# Patient Record
Sex: Female | Born: 1950
Health system: Southern US, Community
[De-identification: ages and names within clinical notes are randomized; demographics above are authoritative.]

## PROBLEM LIST (undated history)

## (undated) DIAGNOSIS — R112 Nausea with vomiting, unspecified: Secondary | ICD-10-CM

## (undated) DIAGNOSIS — G4730 Sleep apnea, unspecified: Secondary | ICD-10-CM

## (undated) DIAGNOSIS — N189 Chronic kidney disease, unspecified: Secondary | ICD-10-CM

## (undated) DIAGNOSIS — Z9989 Dependence on other enabling machines and devices: Secondary | ICD-10-CM

## (undated) DIAGNOSIS — G4733 Obstructive sleep apnea (adult) (pediatric): Secondary | ICD-10-CM

## (undated) DIAGNOSIS — E119 Type 2 diabetes mellitus without complications: Secondary | ICD-10-CM

## (undated) DIAGNOSIS — C801 Malignant (primary) neoplasm, unspecified: Secondary | ICD-10-CM

## (undated) DIAGNOSIS — K219 Gastro-esophageal reflux disease without esophagitis: Secondary | ICD-10-CM

## (undated) DIAGNOSIS — R21 Rash and other nonspecific skin eruption: Secondary | ICD-10-CM

## (undated) DIAGNOSIS — I10 Essential (primary) hypertension: Secondary | ICD-10-CM

## (undated) DIAGNOSIS — Z9889 Other specified postprocedural states: Secondary | ICD-10-CM

## (undated) DIAGNOSIS — J189 Pneumonia, unspecified organism: Secondary | ICD-10-CM

## (undated) DIAGNOSIS — Z803 Family history of malignant neoplasm of breast: Secondary | ICD-10-CM

## (undated) DIAGNOSIS — Z8719 Personal history of other diseases of the digestive system: Secondary | ICD-10-CM

## (undated) DIAGNOSIS — E785 Hyperlipidemia, unspecified: Secondary | ICD-10-CM

## (undated) DIAGNOSIS — I480 Paroxysmal atrial fibrillation: Secondary | ICD-10-CM

## (undated) DIAGNOSIS — Z808 Family history of malignant neoplasm of other organs or systems: Secondary | ICD-10-CM

## (undated) DIAGNOSIS — K760 Fatty (change of) liver, not elsewhere classified: Secondary | ICD-10-CM

## (undated) DIAGNOSIS — M722 Plantar fascial fibromatosis: Secondary | ICD-10-CM

## (undated) DIAGNOSIS — R7303 Prediabetes: Secondary | ICD-10-CM

## (undated) DIAGNOSIS — N393 Stress incontinence (female) (male): Secondary | ICD-10-CM

## (undated) DIAGNOSIS — M1990 Unspecified osteoarthritis, unspecified site: Secondary | ICD-10-CM

## (undated) DIAGNOSIS — S32010A Wedge compression fracture of first lumbar vertebra, initial encounter for closed fracture: Secondary | ICD-10-CM

## (undated) DIAGNOSIS — Z800 Family history of malignant neoplasm of digestive organs: Secondary | ICD-10-CM

## (undated) DIAGNOSIS — R269 Unspecified abnormalities of gait and mobility: Secondary | ICD-10-CM

## (undated) DIAGNOSIS — I483 Typical atrial flutter: Secondary | ICD-10-CM

## (undated) HISTORY — PX: BLADDER SURGERY: SHX569

## (undated) HISTORY — PX: COLONOSCOPY: SHX174

## (undated) HISTORY — PX: BURCH PROCEDURE: SHX1273

## (undated) HISTORY — DX: Family history of malignant neoplasm of other organs or systems: Z80.8

## (undated) HISTORY — DX: Family history of malignant neoplasm of digestive organs: Z80.0

## (undated) HISTORY — DX: Sleep apnea, unspecified: G47.30

## (undated) HISTORY — DX: Stress incontinence (female) (male): N39.3

## (undated) HISTORY — DX: Hyperlipidemia, unspecified: E78.5

## (undated) HISTORY — DX: Paroxysmal atrial fibrillation: I48.0

## (undated) HISTORY — DX: Malignant (primary) neoplasm, unspecified: C80.1

## (undated) HISTORY — DX: Gastro-esophageal reflux disease without esophagitis: K21.9

## (undated) HISTORY — DX: Family history of malignant neoplasm of breast: Z80.3

## (undated) HISTORY — PX: ANTERIOR AND POSTERIOR VAGINAL REPAIR: SUR5

## (undated) HISTORY — DX: Unspecified abnormalities of gait and mobility: R26.9

## (undated) HISTORY — DX: Typical atrial flutter: I48.3

## (undated) HISTORY — PX: VAGINAL HYSTERECTOMY: SUR661

## (undated) HISTORY — DX: Chronic kidney disease, unspecified: N18.9

## (undated) HISTORY — DX: Essential (primary) hypertension: I10

---

## 2000-07-23 ENCOUNTER — Encounter: Payer: Self-pay | Admitting: Family Medicine

## 2000-07-23 ENCOUNTER — Encounter: Admission: RE | Admit: 2000-07-23 | Discharge: 2000-07-23 | Payer: Self-pay | Admitting: Family Medicine

## 2001-04-07 ENCOUNTER — Ambulatory Visit (HOSPITAL_COMMUNITY): Admission: RE | Admit: 2001-04-07 | Discharge: 2001-04-07 | Payer: Self-pay | Admitting: Gastroenterology

## 2001-09-01 ENCOUNTER — Inpatient Hospital Stay (HOSPITAL_COMMUNITY): Admission: RE | Admit: 2001-09-01 | Discharge: 2001-09-03 | Payer: Self-pay | Admitting: Obstetrics and Gynecology

## 2001-09-01 ENCOUNTER — Encounter (INDEPENDENT_AMBULATORY_CARE_PROVIDER_SITE_OTHER): Payer: Self-pay | Admitting: Specialist

## 2004-02-16 ENCOUNTER — Encounter: Admission: RE | Admit: 2004-02-16 | Discharge: 2004-02-16 | Payer: Self-pay | Admitting: Family Medicine

## 2004-02-16 IMAGING — CR DG UGI W/ HIGH DENSITY W/KUB
1 series · 1 of 1 positions shown · non-contrast
Comparison: none

CLINICAL DATA: The patient has abdominal pain as well as a cough.
 HIGH DENSITY UPPER GI SERIES WITH KUB
 The preliminary KUB reveals the bowel gas pattern to be unremarkable.
 The swallowing function is normal.  There is cervical spondylosis with mild extrinsic on the cervical esophagus.  The primary peristaltic wave is satisfactory.  There is noted to be a sliding type hiatal hernia with a small Schatzki?s ring which does not obstruct the passage of a 13 mm tablet.  There is noted to be mild associated gastroesophageal reflux.
 The stomach is well filled without abnormality.  There is some delay in filling of the duodenum.  There is diffuse spasm of the duodenum with prominence of the duodenal mucosa.  Small superficial ulceration may be present.
 IMPRESSION
 1.  Hiatal hernia with mild gastroesophageal reflux.
 2.  Findings thought to represent peptic ulcer changes of the duodenum with questionable active ulceration.

[view not recorded]
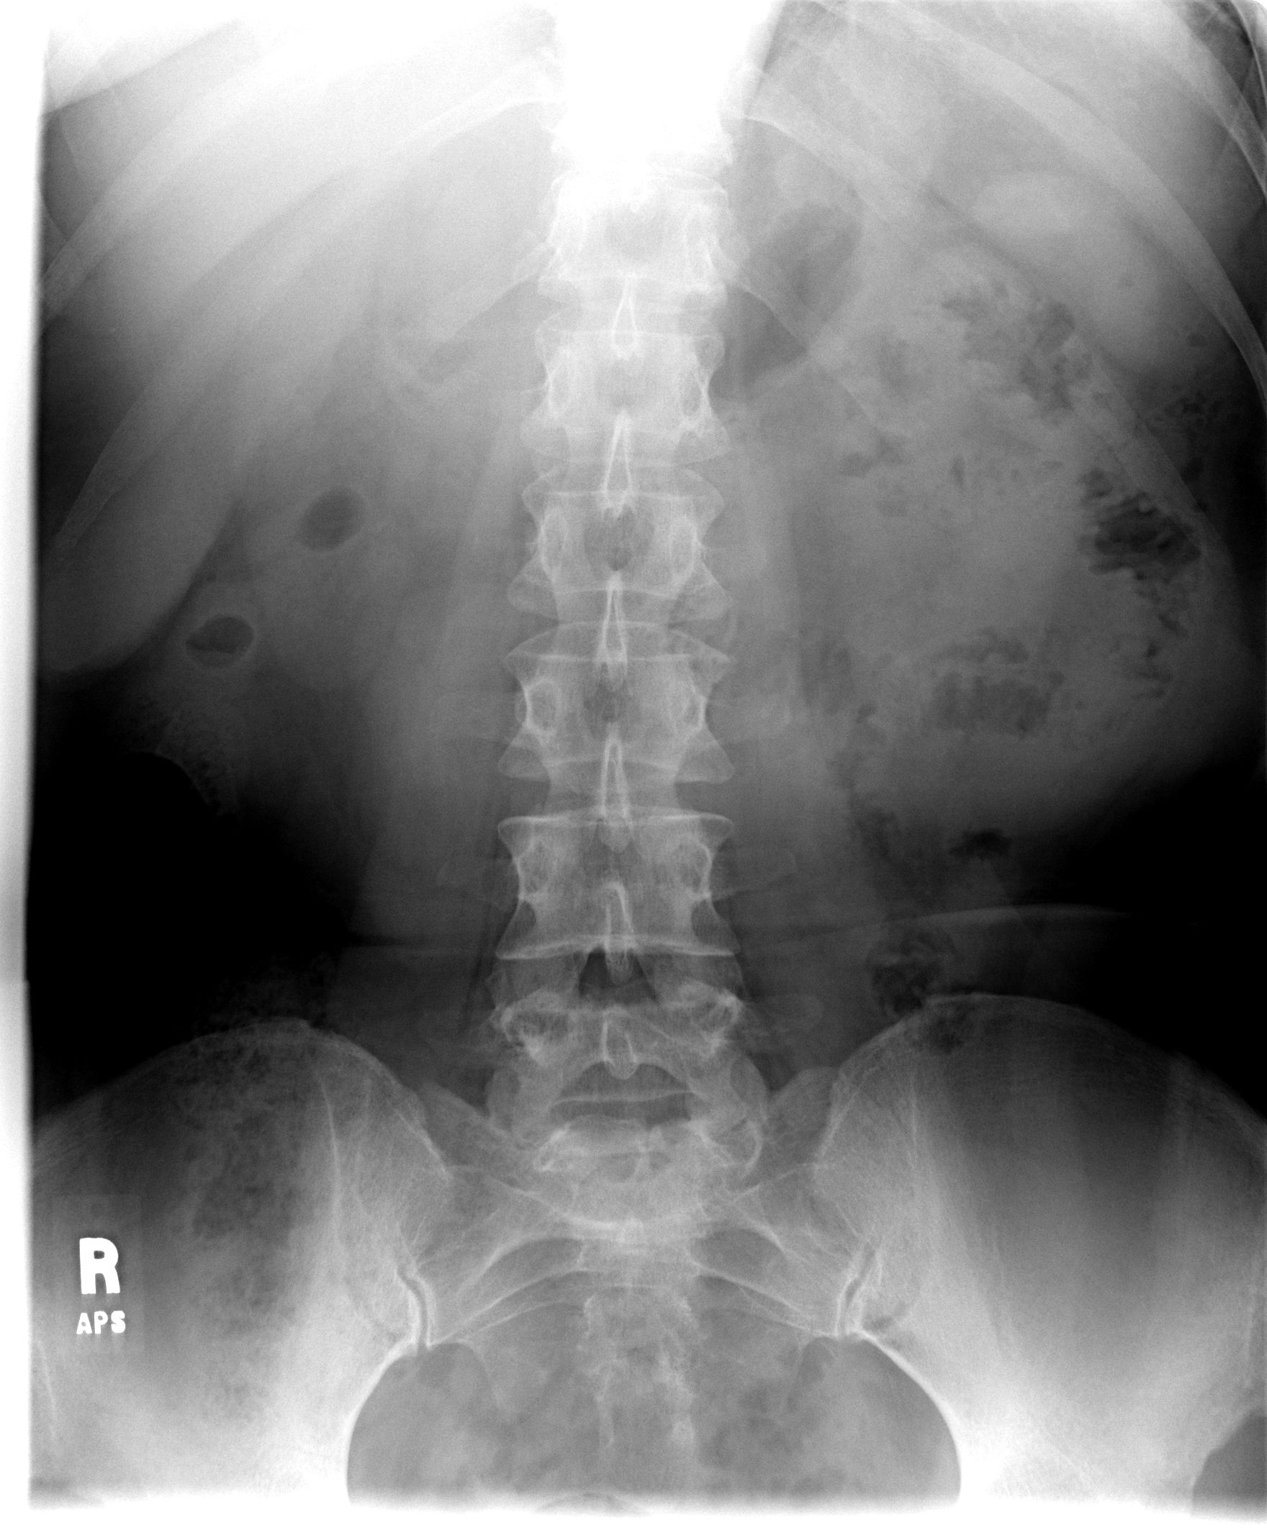

[1 of 1 positions shown; findings below may reference images not displayed]

## 2004-02-16 IMAGING — US US ABDOMEN COMPLETE
1 series · 14 of 25 positions shown · non-contrast
Comparison: none

CLINICAL DATA: Abdominal pain.  
 ULTRASOUND OF THE ABDOMEN, COMPLETE
 Gallbladder is well visualized without evidence of stones.  There is a small polyp in the gallbladder.  Gallbladder wall is slightly indistinct, however, it is not felt to be enlarged.  No associated fluid.  Common bile duct is normal measuring 4 mm.  There is diffuse increase in the echoes throughout the liver with some barium near the porta hepatis.  The pancreas is unremarkable although the tail of the pancreas is not optimally visualized.  Spleen size is normal.  Right kidney measures 12.5 cm with some thinning of the cortex of the right kidney.  No hydronephrosis.  The left kidney measures 12.6 cm with a 15 x 10 mm echogenic area in the upper pole of the left kidney, probably thought to represent an angiomyolipoma.  Aorta is normal measuring 2.4 cm.  The IVC is unremarkable.
 IMPRESSION
 No gallstones.  Small gallbladder polyp. 
 Fatty changes throughout the liver. 
 15 mm probable angiomyolipoma of the upper pole of the left kidney.  Follow up ultrasound, particularly of the left kidney, would be suggested in six months.

[Series 1: unknown · 0.30mm/px · 14 of 75 slices shown]
[im 1/75]
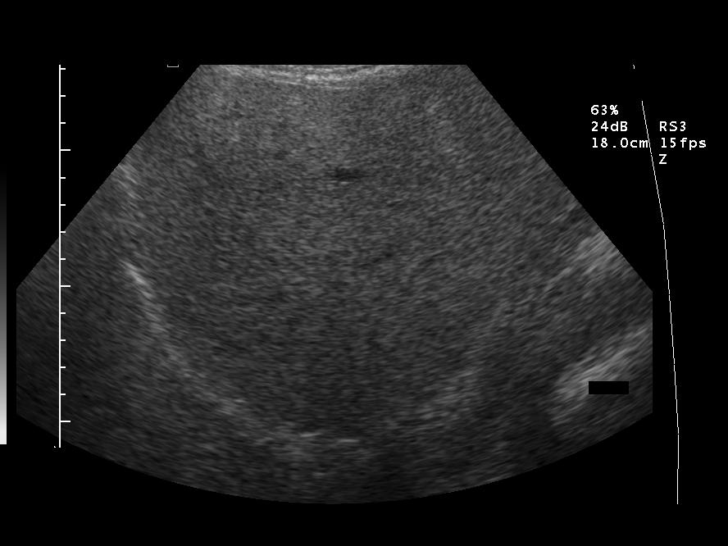
[im 7/75]
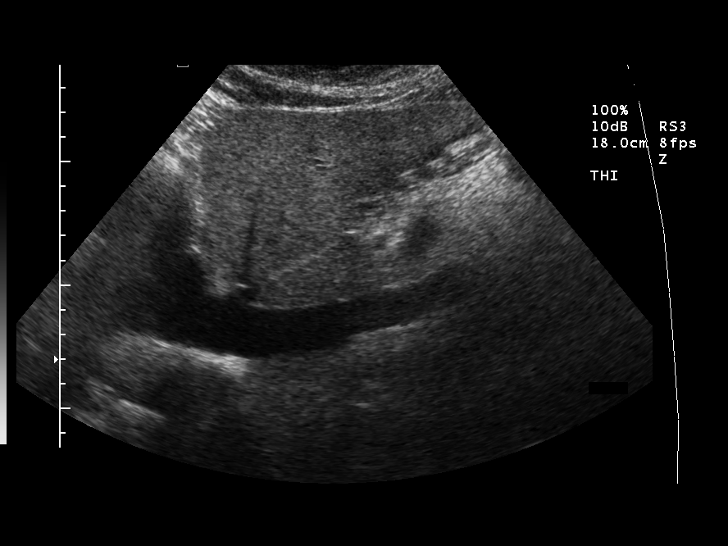
[im 13/75]
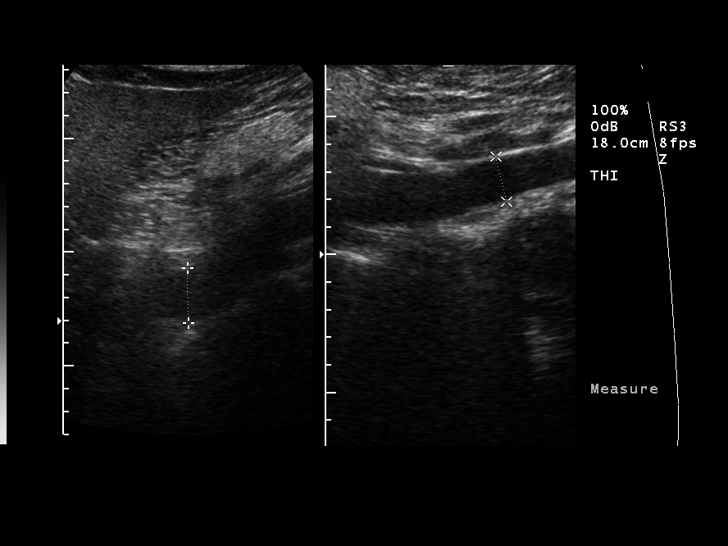
[im 19/75]
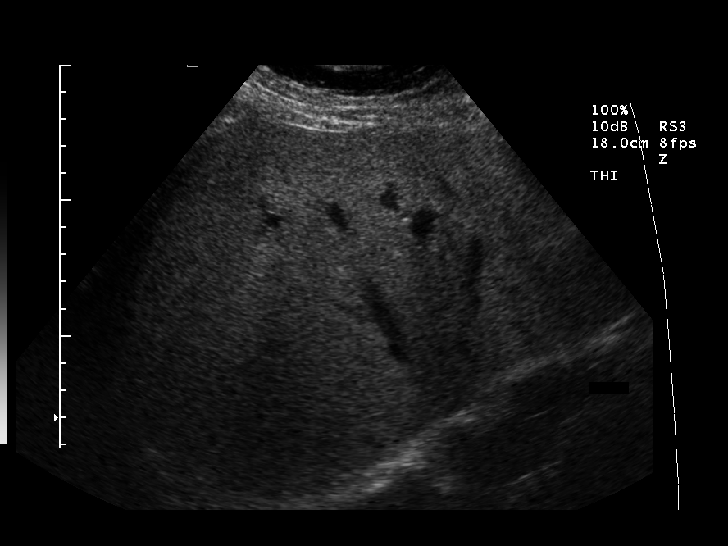
[im 25/75]
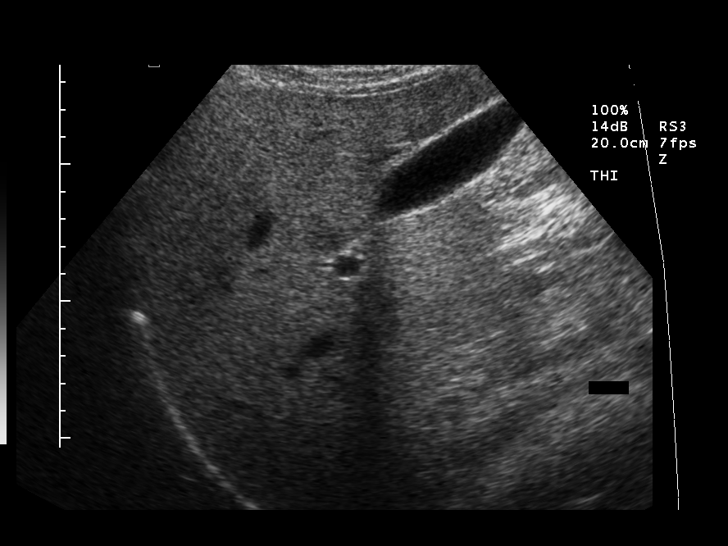
[im 28/75]
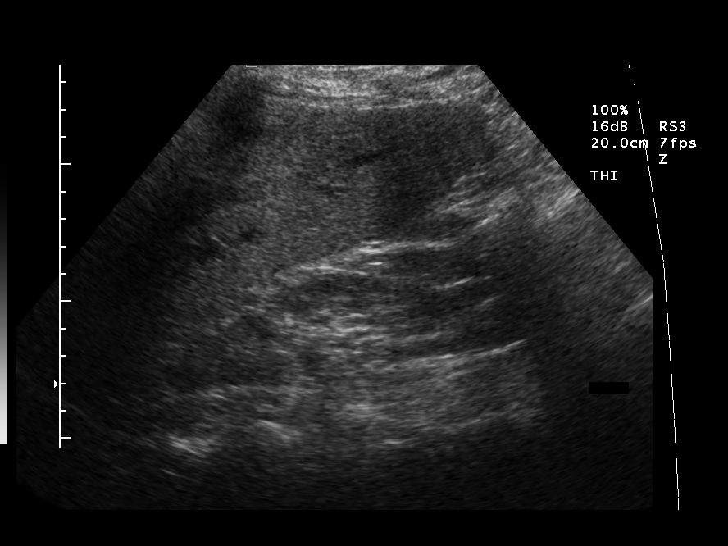
[im 34/75]
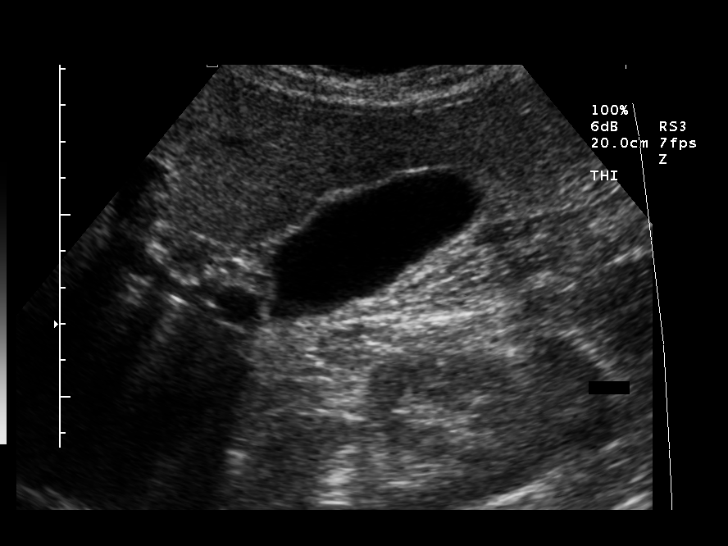
[im 41/75]
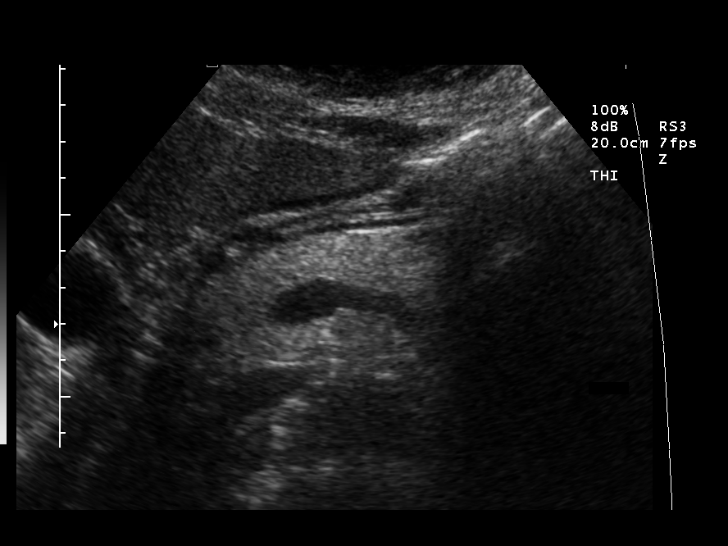
[im 47/75]
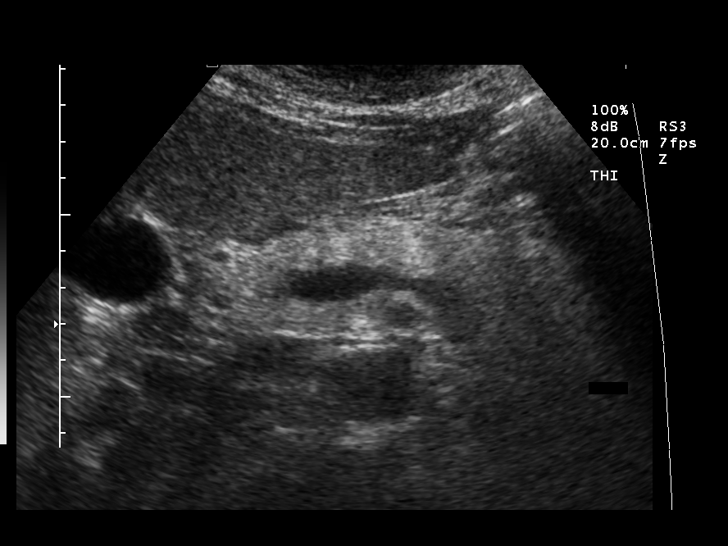
[im 50/75]
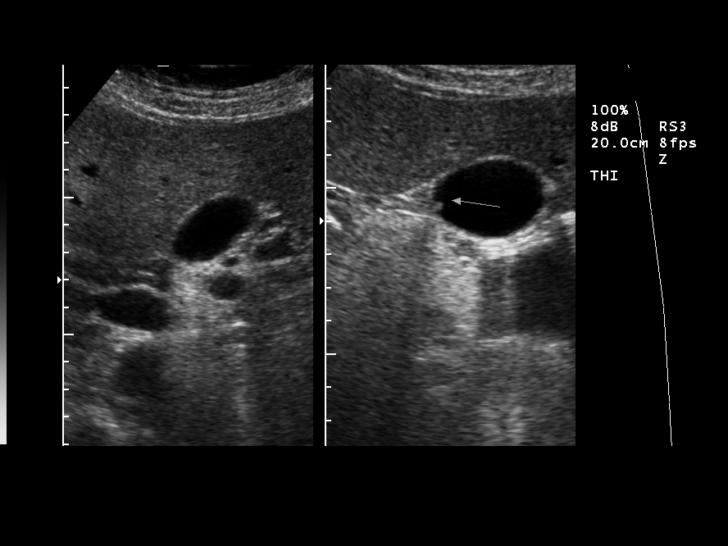
[im 56/75]
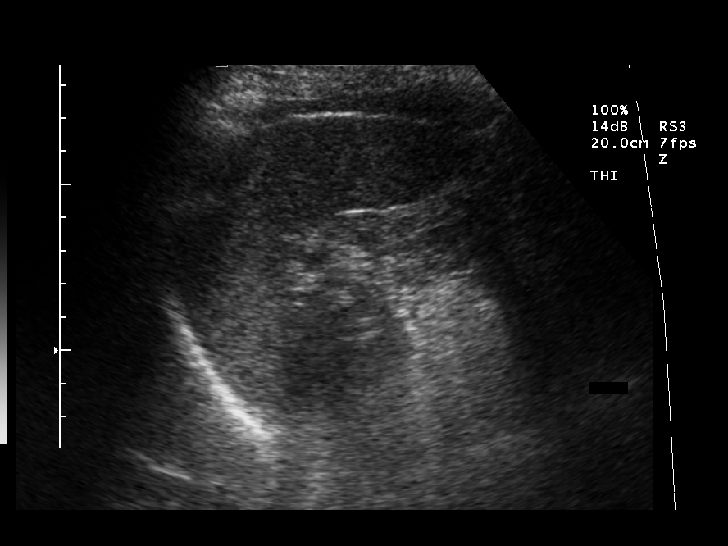
[im 62/75]
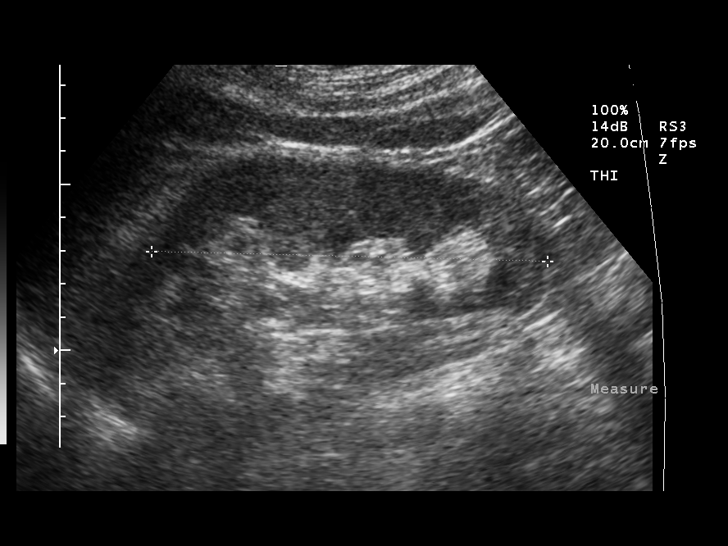
[im 68/75]
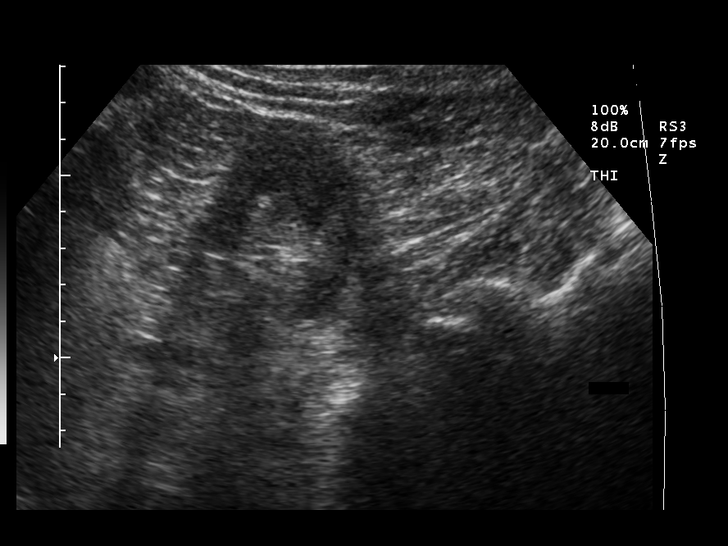
[im 75/75]
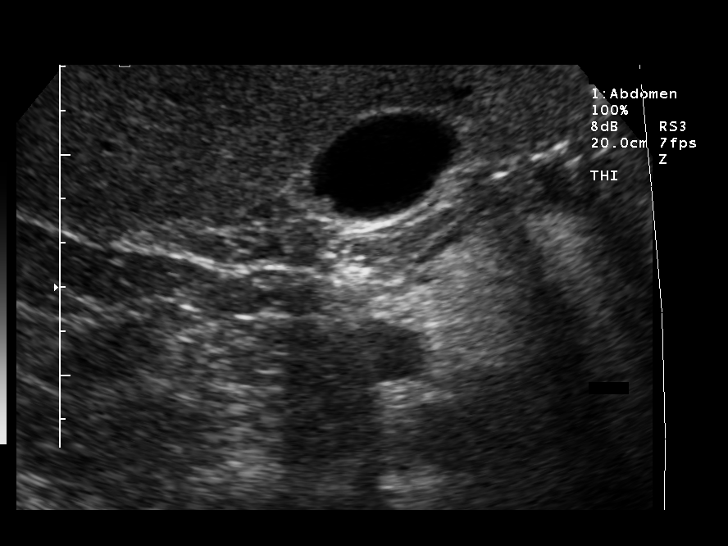

[14 of 25 positions shown; findings below may reference images not displayed]

## 2004-02-16 IMAGING — RF DG UGI W/ HIGH DENSITY W/KUB
12 of 14 series · 19 of 24 positions shown · non-contrast
Comparison: none

CLINICAL DATA: The patient has abdominal pain as well as a cough.
 HIGH DENSITY UPPER GI SERIES WITH KUB
 The preliminary KUB reveals the bowel gas pattern to be unremarkable.
 The swallowing function is normal.  There is cervical spondylosis with mild extrinsic on the cervical esophagus.  The primary peristaltic wave is satisfactory.  There is noted to be a sliding type hiatal hernia with a small Schatzki?s ring which does not obstruct the passage of a 13 mm tablet.  There is noted to be mild associated gastroesophageal reflux.
 The stomach is well filled without abnormality.  There is some delay in filling of the duodenum.  There is diffuse spasm of the duodenum with prominence of the duodenal mucosa.  Small superficial ulceration may be present.
 IMPRESSION
 1.  Hiatal hernia with mild gastroesophageal reflux.
 2.  Findings thought to represent peptic ulcer changes of the duodenum with questionable active ulceration.

[Series 1: run · 1 of 1 slices shown (1 of 12)]
[im 1/1]
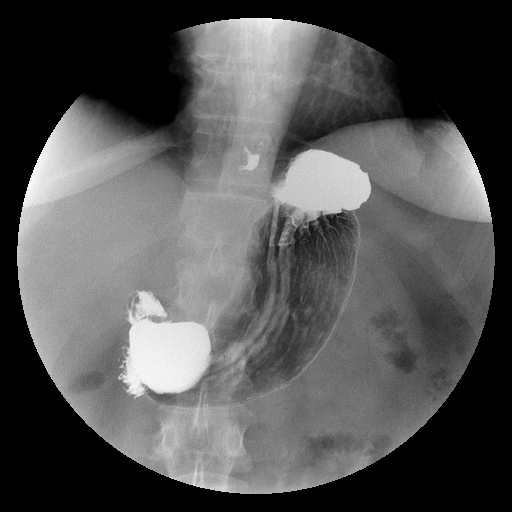

[Series 2: run · 1 of 1 slices shown (2 of 12)]
[im 1/1]
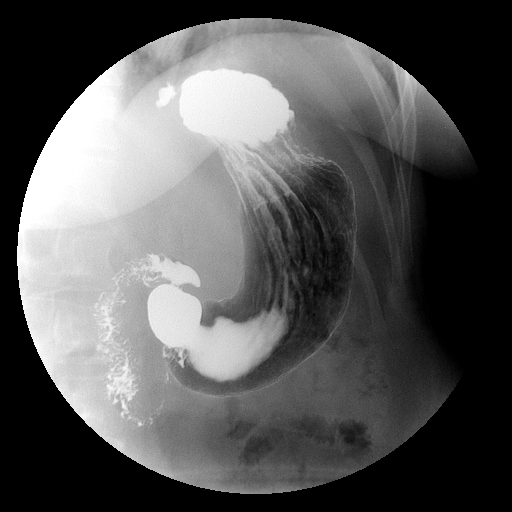

[Series 4: run · 1 of 1 slices shown (3 of 12)]
[im 1/1]
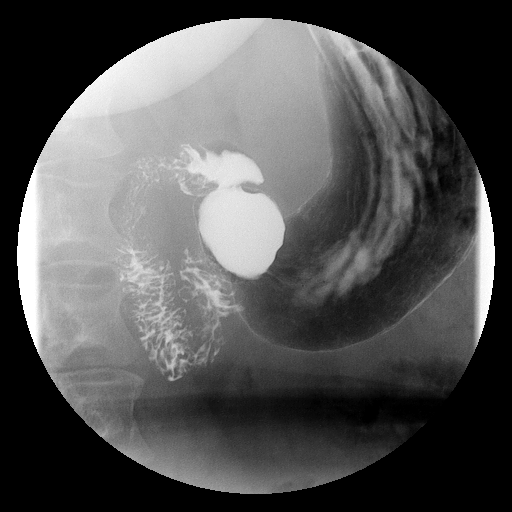

[Series 5: run · 1 of 1 slices shown (4 of 12)]
[im 1/1]
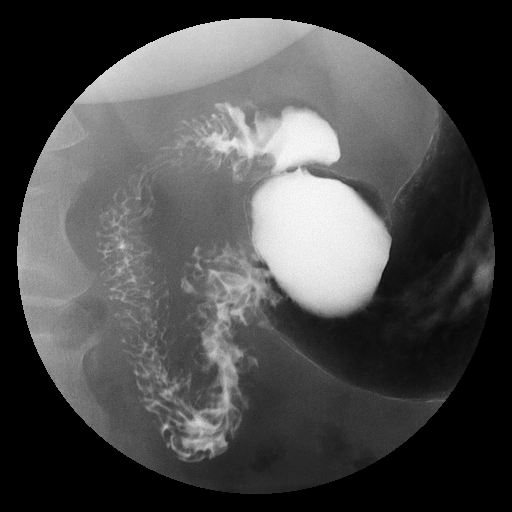

[Series 6: run · 1 of 1 slices shown (5 of 12)]
[im 1/1]
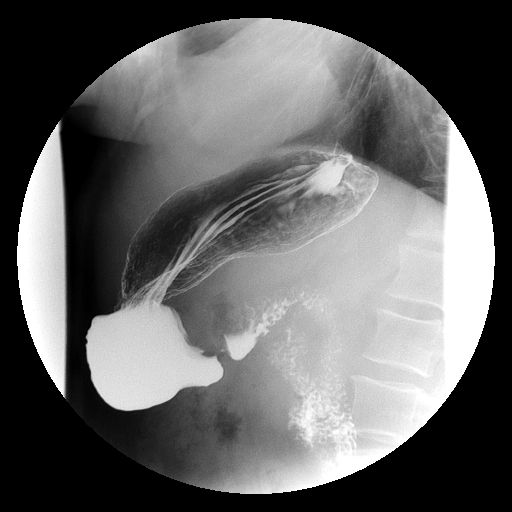

[Series 7: run · 1 of 1 slices shown (6 of 12)]
[im 1/1]
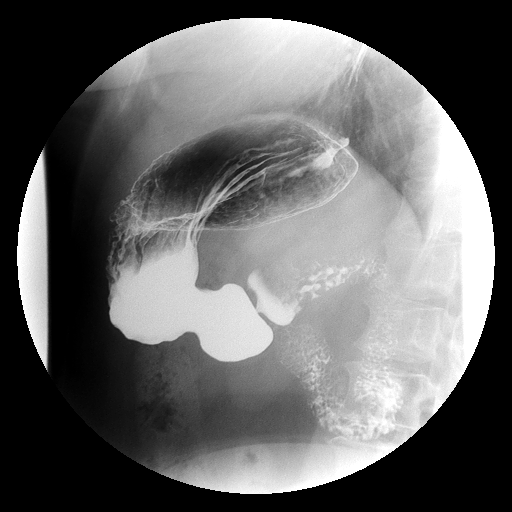

[Series 9: run · 3 of 19 slices shown (7 of 12)]
[im 1/19]
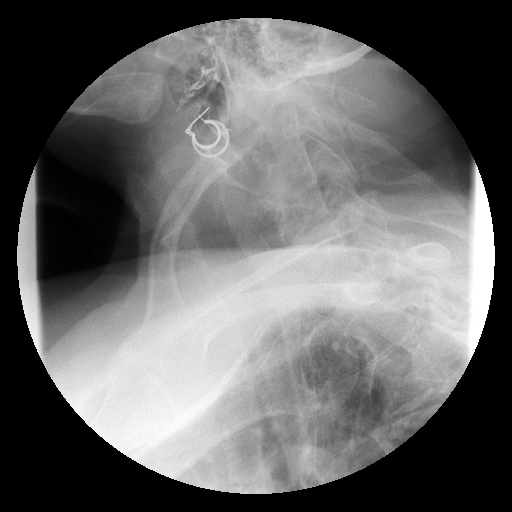
[im 10/19]
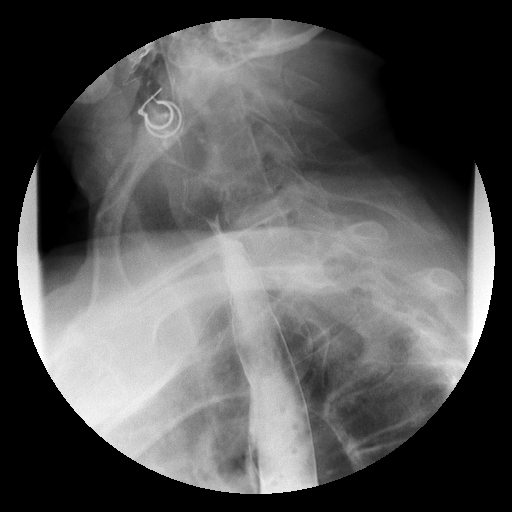
[im 19/19]
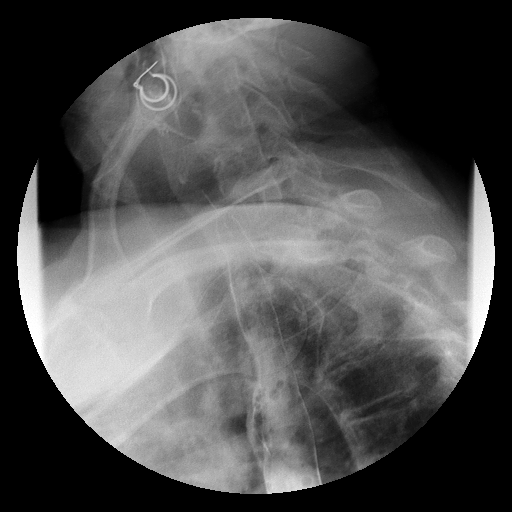

[Series 10: run · 4 of 24 slices shown (8 of 12)]
[im 5/24]
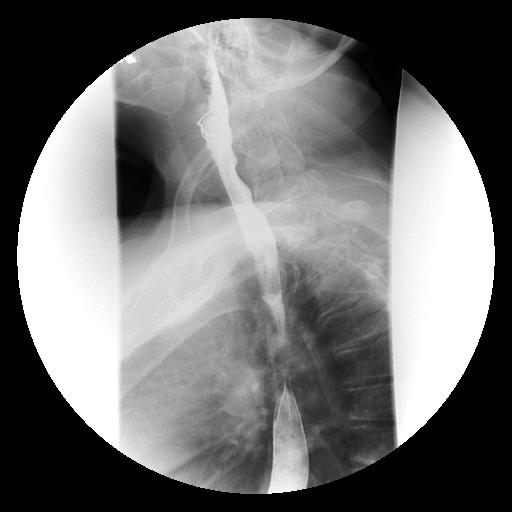
[im 10/24]
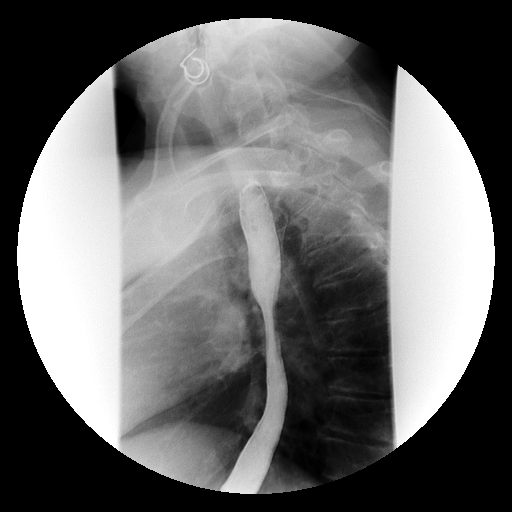
[im 14/24]
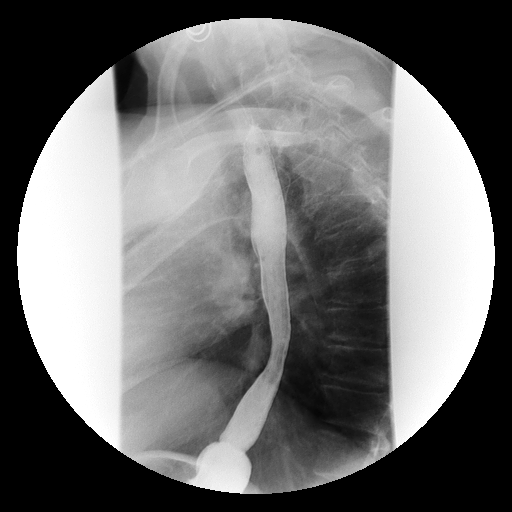
[im 19/24]
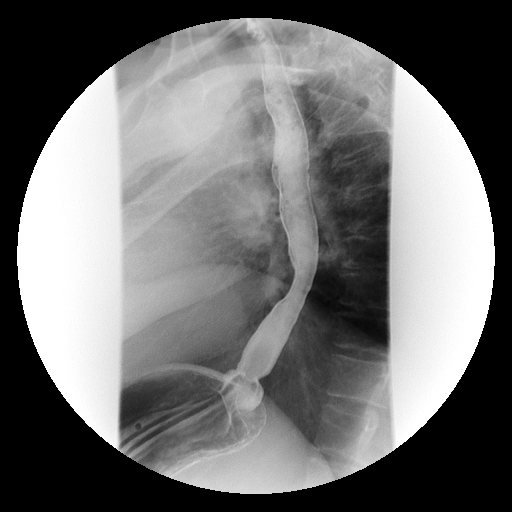

[Series 11: run · 1 of 8 slices shown (9 of 12)]
[im 1/8]
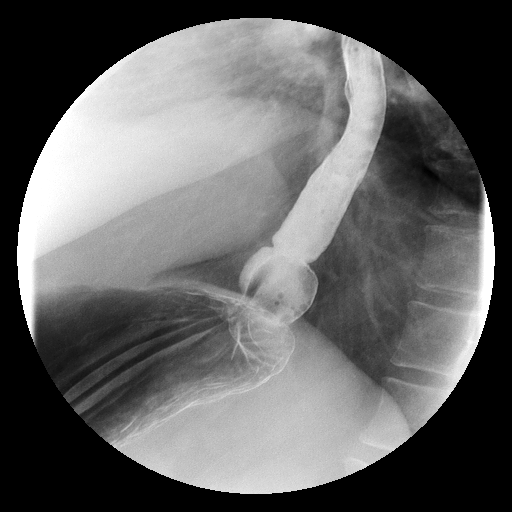

[Series 12: run · 1 of 2 slices shown (10 of 12)]
[im 1/2]
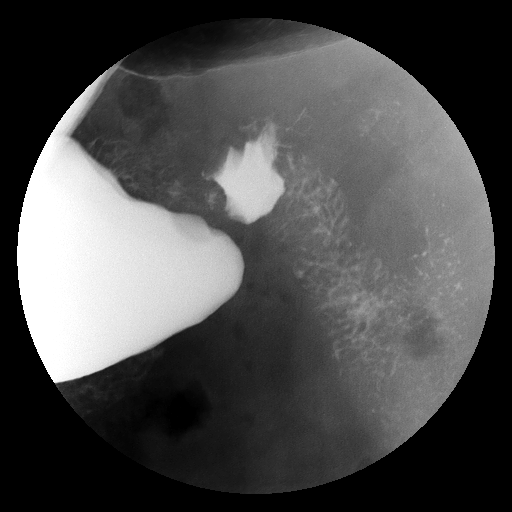

[Series 13: run · 3 of 16 slices shown (11 of 12)]
[im 1/16]
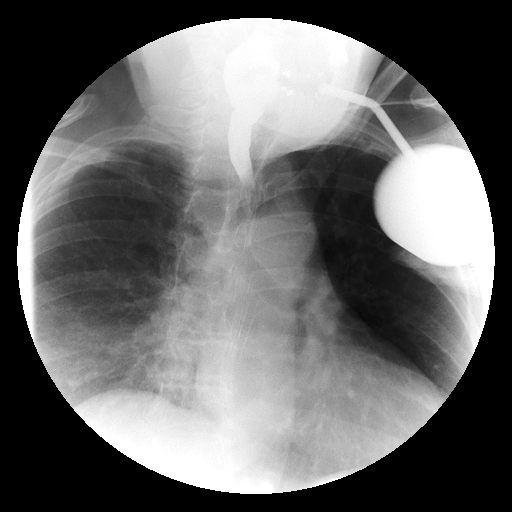
[im 6/16]
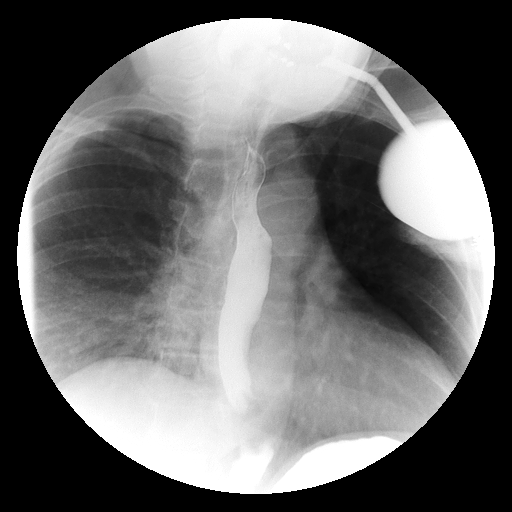
[im 16/16]
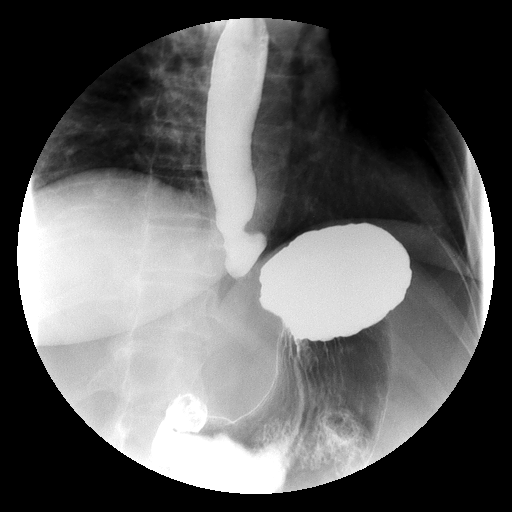

[Series 14: run · 1 of 8 slices shown (12 of 12)]
[im 1/8]
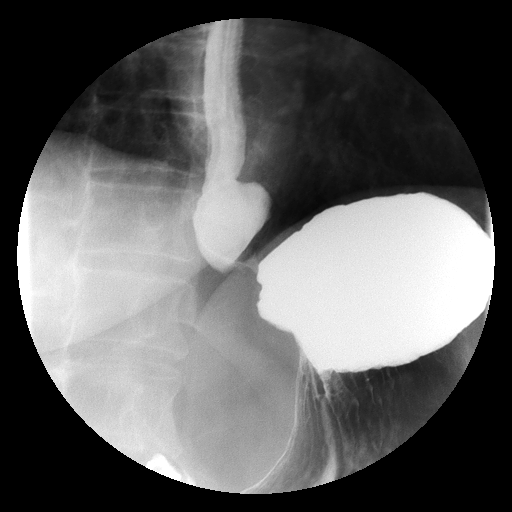

[19 of 24 positions shown; findings below may reference images not displayed]

## 2004-02-16 IMAGING — CR DG CHEST 2V
2 series · 2 of 2 positions shown · non-contrast
Comparison: none

CLINICAL DATA: Cough.
 CHEST, TWO VIEWS
 PA and lateral views reveal the heart size to be normal.  Markings are accentuated in the lung fields without active findings.  Mild spurring is noted in the thoracic spine.
 IMPRESSION
 No active disease.

[view not recorded (1 of 2)]
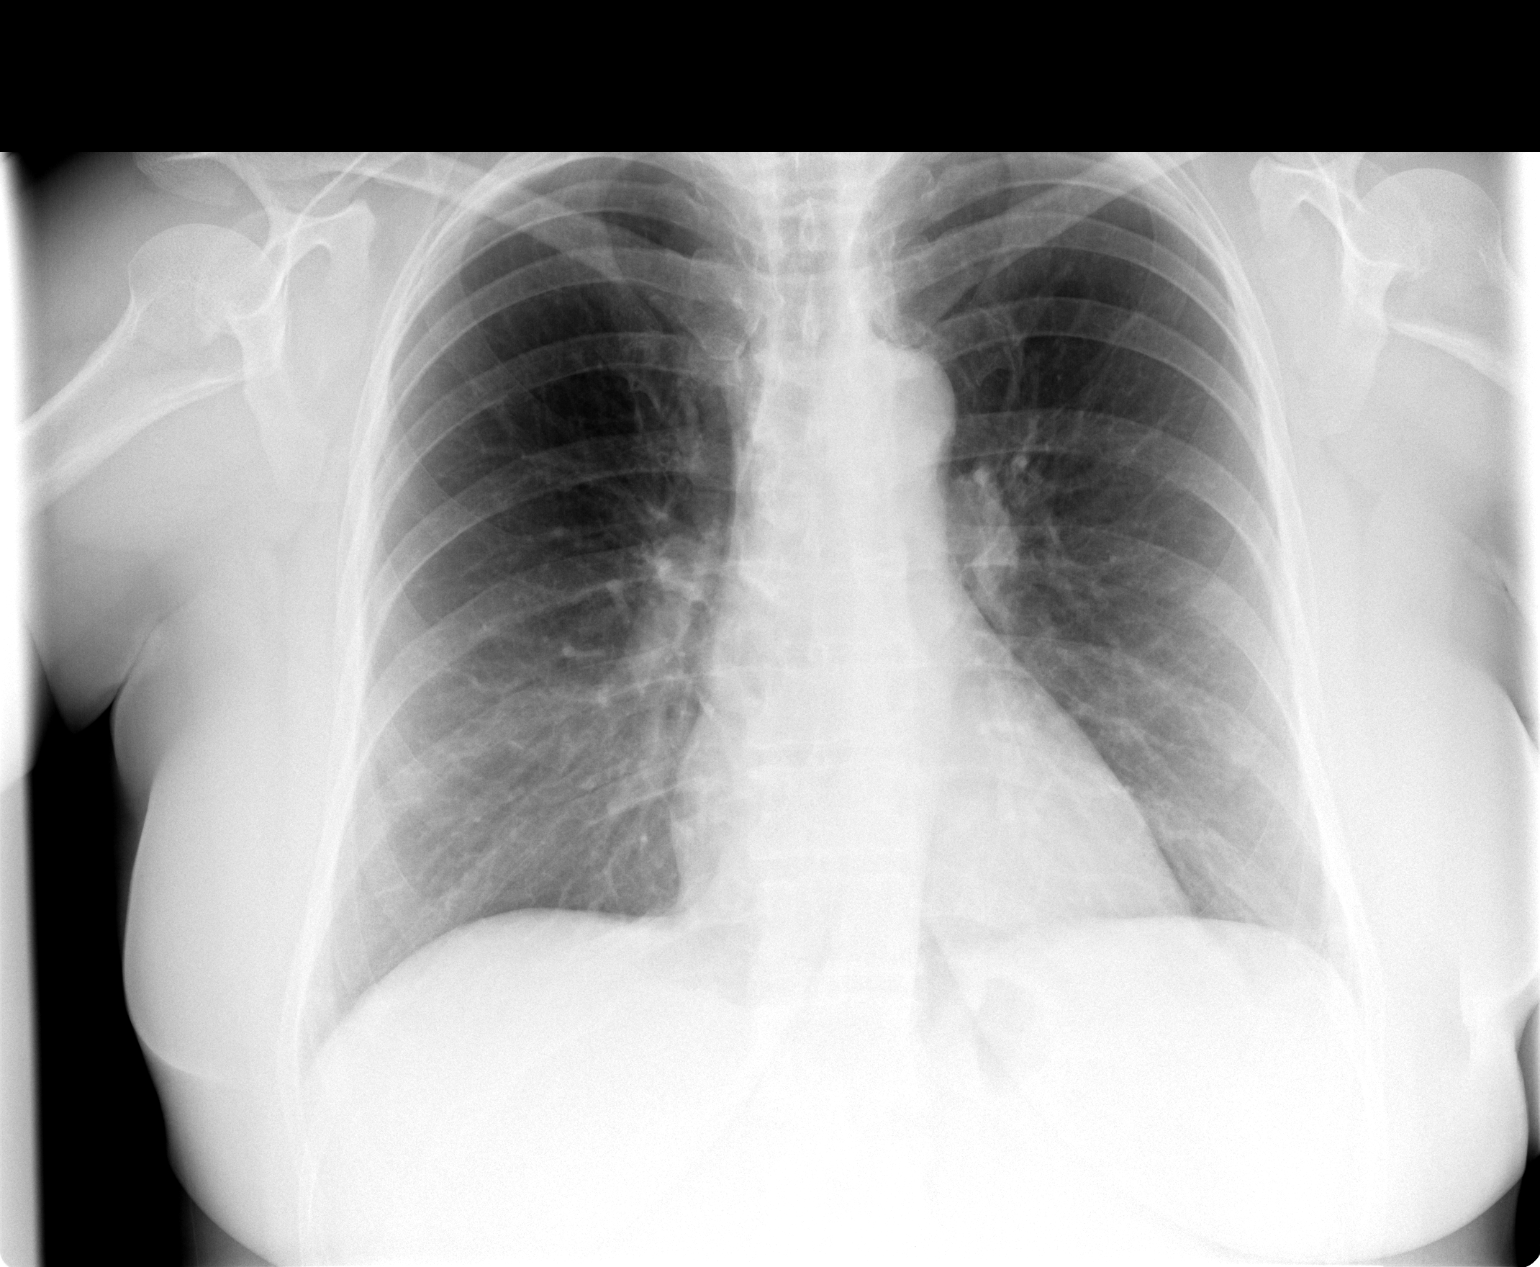

[view not recorded (2 of 2)]
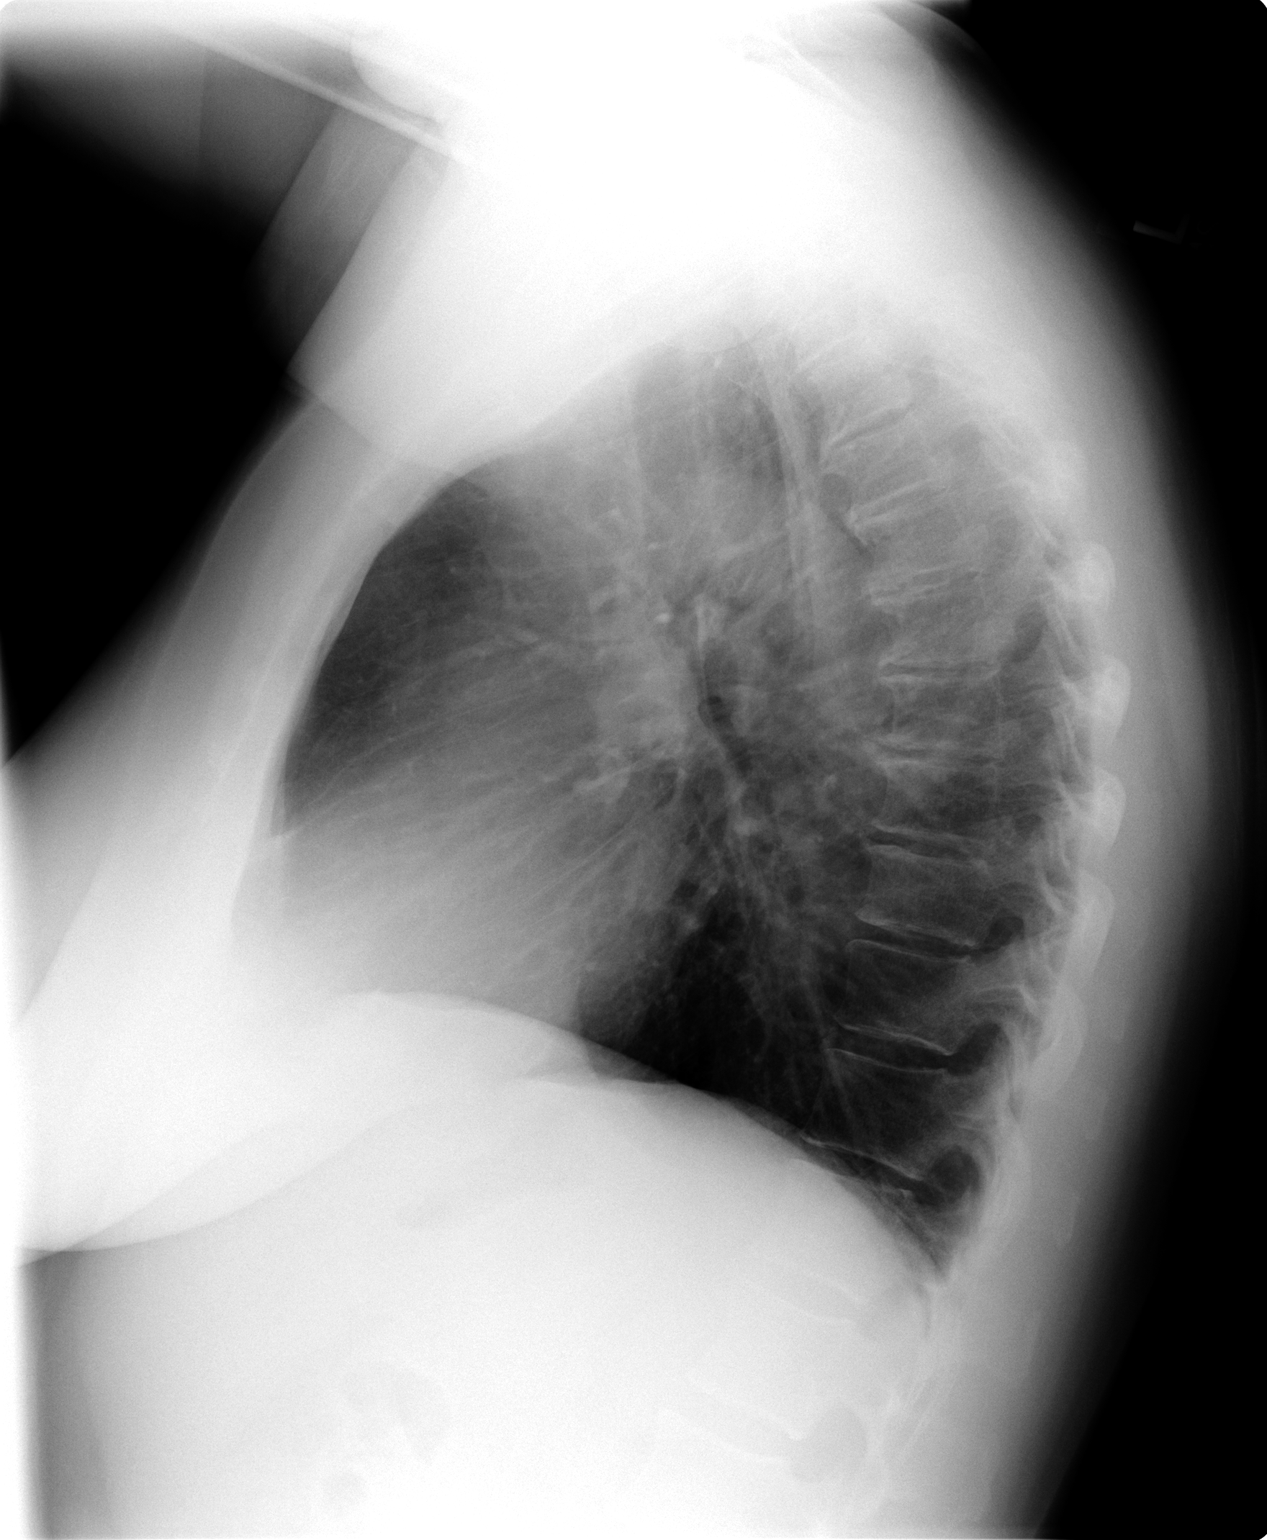

[2 of 2 positions shown; findings below may reference images not displayed]

## 2007-04-11 ENCOUNTER — Encounter: Admission: RE | Admit: 2007-04-11 | Discharge: 2007-04-11 | Payer: Self-pay | Admitting: Family Medicine

## 2007-04-11 IMAGING — US US ABDOMEN COMPLETE
1 series · 13 of 25 positions shown · non-contrast
Comparison: [DATE].

CLINICAL DATA: Elevated LFTs.  Question fatty liver.
ABDOMEN ULTRASOUND:
TECHNIQUE: Complete abdominal ultrasound examination was performed including evaluation of the liver, gallbladder, bile ducts, pancreas, kidneys, spleen, IVC, and abdominal aorta.

[Series 1: us abdomen complete · 0.32mm/px · 13 of 76 slices shown]
[im 1/76]
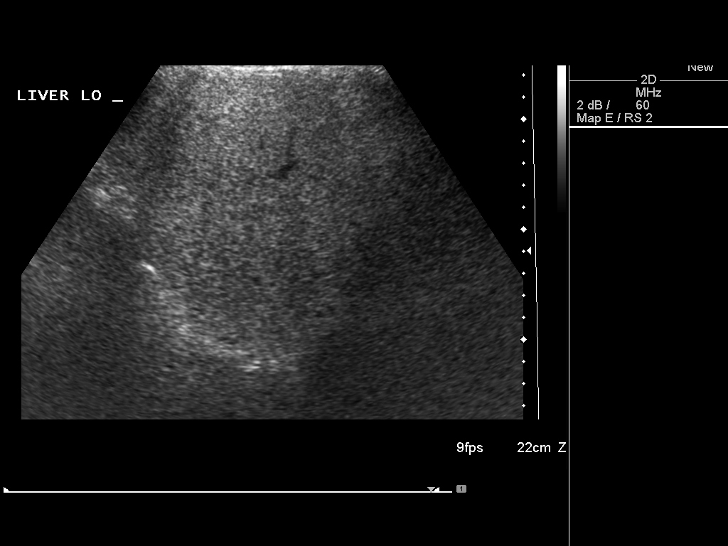
[im 7/76]
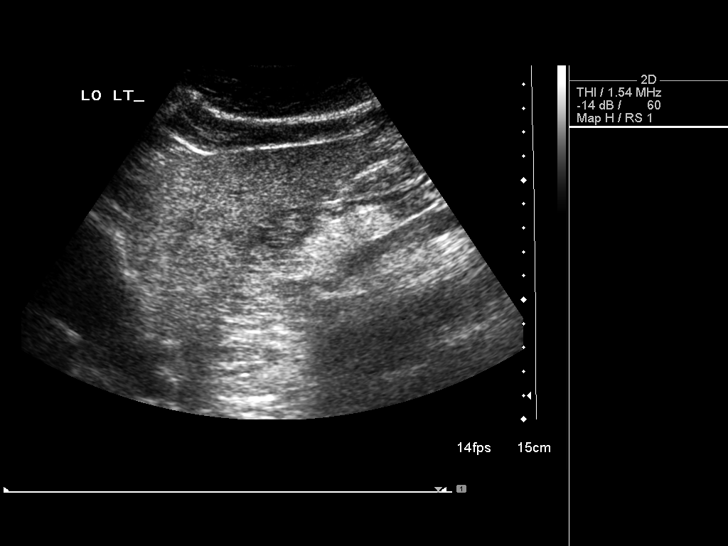
[im 13/76]
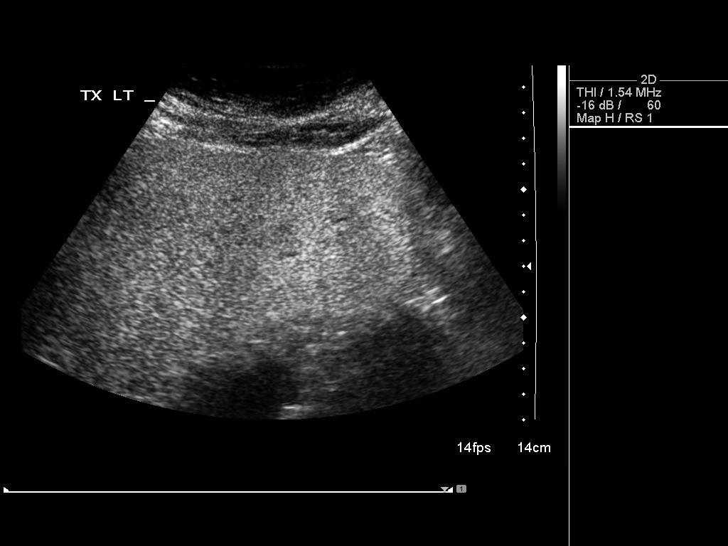
[im 19/76]
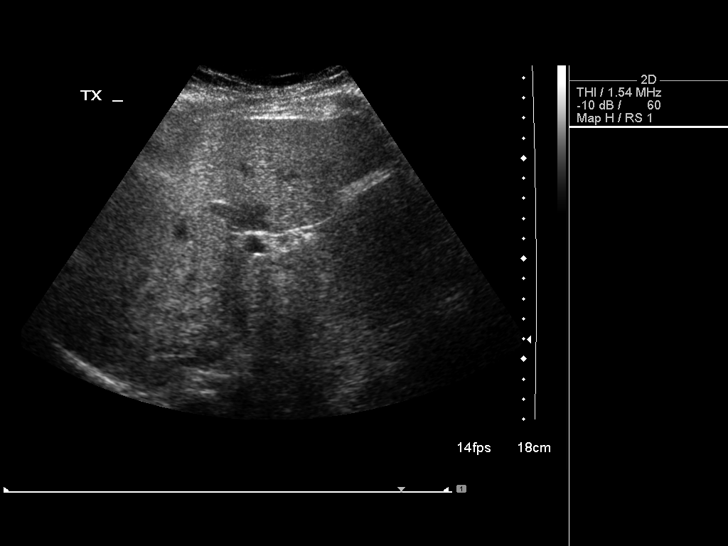
[im 26/76]
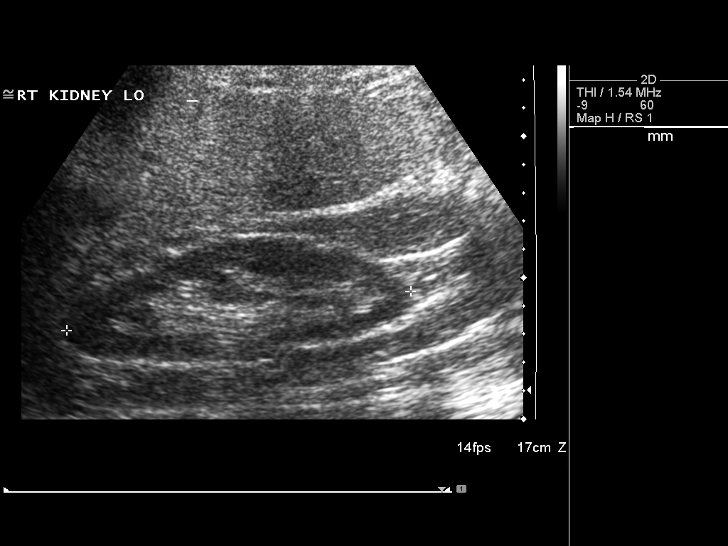
[im 32/76]
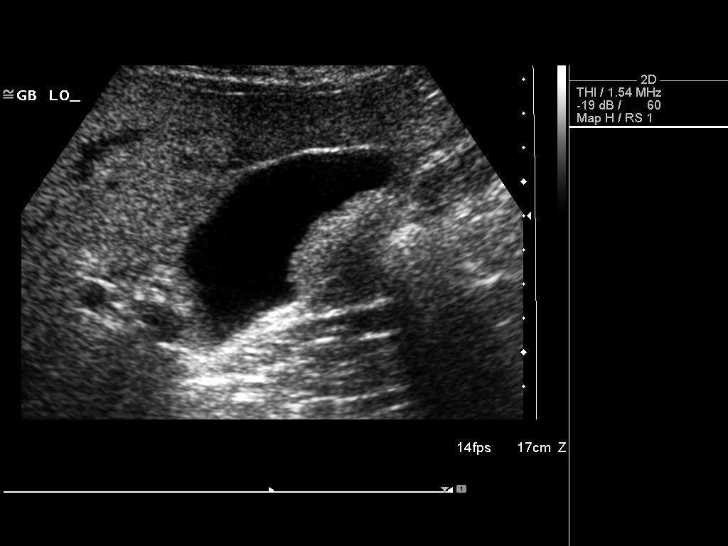
[im 38/76]
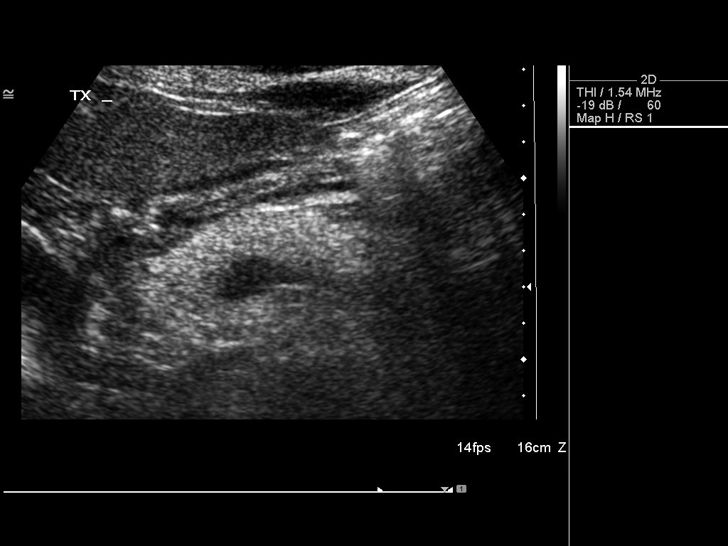
[im 44/76]
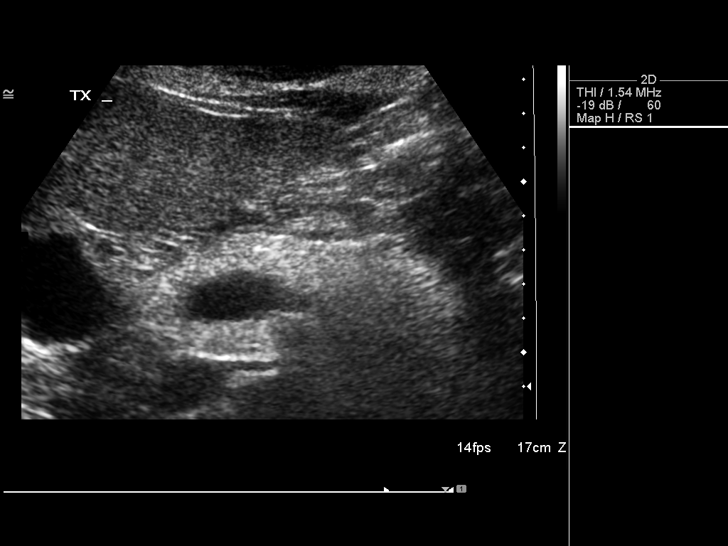
[im 51/76]
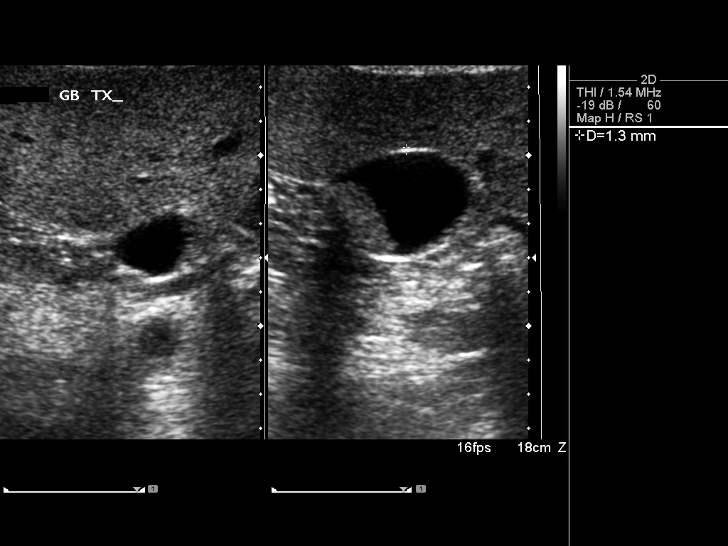
[im 57/76]
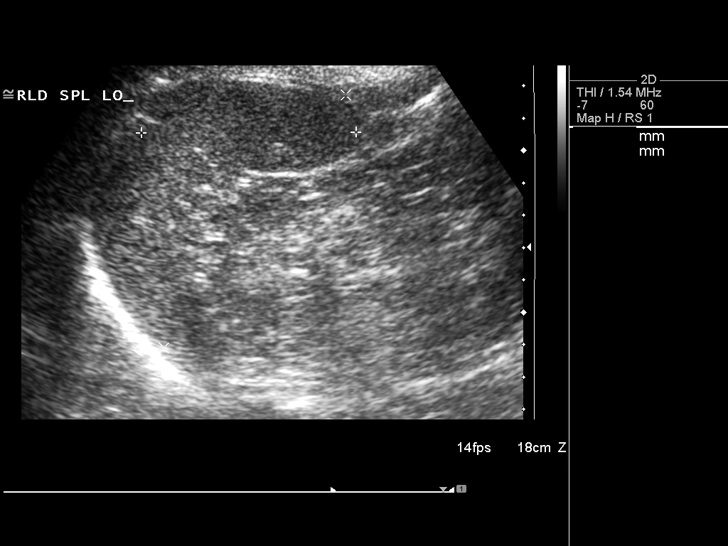
[im 63/76]
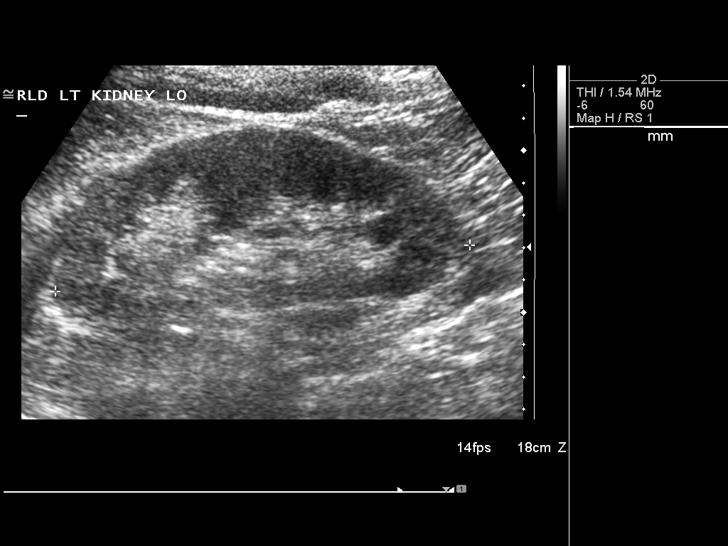
[im 69/76]
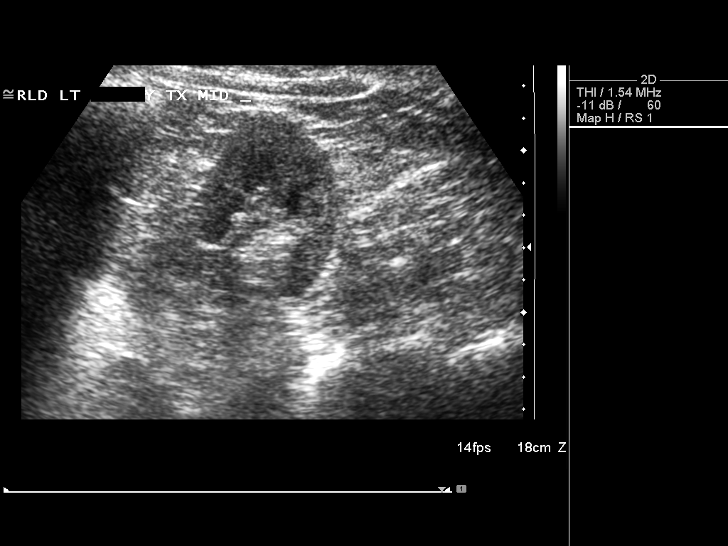
[im 76/76]
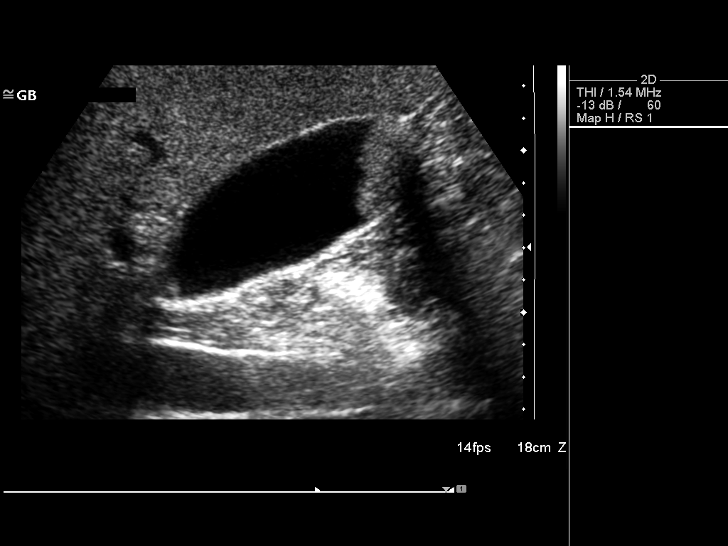

[13 of 25 positions shown; findings below may reference images not displayed]

FINDINGS: Diffuse increased heterogeneity and echogenicity of the liver suggestive of fatty infiltration.  Mild sparing near the porta hepatis.  Otherwise, no focal hepatic lesion or intrahepatic biliary duct dilatation.  3.9 mm gallbladder polyp.  3.5 x 2.1 cm rounded echogenic, mobile structure suggestive of a sludge ball.  Common bile duct measures up to 6.1 mm.  
Inferior vena cava unremarkable.  Proximal abdominal aorta 2.9 cm without focal aneurysm.    Limited evaluation of pancreatic tail secondary to overlying bowel gas.  Remainder of pancreas without focal mass or pancreatic duct dilatation.
Right kidney 12.6 cm and left kidney 12.9 cm in length.  No hydronephrosis or right sided renal mass.  Within the upper pole of the left kidney is a 12.1 x 7.9 x 8.1 mm echogenic focus which appears relatively similar to prior exam and may represent a small angiomyolipoma.  Spleen unremarkable spanning over 9.6 cm.
IMPRESSION: 1.  Diffuse fatty infiltration of the liver with slight sparing near the porta hepatis.  
2.  Tiny gallbladder polyp with large sludge ball suspected, as described above.
3.  Limited evaluation of the pancreatic tail secondary to overlying bowel gas with remainder of pancreas unremarkable.
4.  Echogenic structure upper pole left kidney, unchanged, probably representing a small angiomyolipoma.

## 2009-03-25 DIAGNOSIS — N393 Stress incontinence (female) (male): Secondary | ICD-10-CM | POA: Insufficient documentation

## 2009-03-28 ENCOUNTER — Ambulatory Visit: Payer: Self-pay | Admitting: Cardiology

## 2009-03-28 ENCOUNTER — Encounter: Payer: Self-pay | Admitting: Cardiology

## 2009-03-28 DIAGNOSIS — I10 Essential (primary) hypertension: Secondary | ICD-10-CM | POA: Insufficient documentation

## 2009-03-28 DIAGNOSIS — E785 Hyperlipidemia, unspecified: Secondary | ICD-10-CM | POA: Insufficient documentation

## 2009-03-28 DIAGNOSIS — R002 Palpitations: Secondary | ICD-10-CM | POA: Insufficient documentation

## 2009-03-28 LAB — CONVERTED CEMR LAB: TSH: 0.82 microintl units/mL (ref 0.35–5.50)

## 2009-04-05 ENCOUNTER — Encounter: Payer: Self-pay | Admitting: Cardiology

## 2009-04-05 ENCOUNTER — Ambulatory Visit: Payer: Self-pay

## 2009-04-07 ENCOUNTER — Telehealth: Payer: Self-pay | Admitting: Cardiology

## 2009-04-26 ENCOUNTER — Ambulatory Visit: Payer: Self-pay | Admitting: Cardiology

## 2011-01-07 ENCOUNTER — Encounter: Payer: Self-pay | Admitting: Family Medicine

## 2011-01-18 NOTE — Assessment & Plan Note (Signed)
 Summary: [facility]   Primary Provider:  Merri Brunette, MD  CC:  Palpitations and .  History of Present Illness: 60 yo with history of HTN and hyperlipidemia presents for evaluation of palpitations.  Pt reports episodes of heart racing and "thudding" since about 12/09.  She also feels like her heart occasionally skips beats.  She usually notices this when she is at rest or in bed at night.  Episodes last up to 5 minutes.  They happen essentially daily.  No syncope or lightheadness associated with these episodes.  No chest pain.  She recently had HCTZ added to her BP regimen and her BP is under good control today.  She says that she occasionally notes some lightheadedness with when she stands up quickly from a lying or sitting position but otherwise has tolerated the med increase well.  She does not drink much caffeine (about 2 soft drinks a day) and she does not use OTC cold meds.  No significant increase in stressors.  She had a Holter monitor done at Dr. Carolee Rota office (24 hr).  This showed frequent PACs and occasional short bursts (4-5 beats of SVT).  There were no long runs of SVT.  Average HR was 62 but minimal HR reached 35.    ECG:  NSR, normal ECG  Labs (3/10):  K 4.1, creatinine 1.15, HDL 35, LDL 104  Current Medications (verified): 1)  Lipitor 10 Mg Tabs (Atorvastatin Calcium) .Marland Kitchen.. 1 Tab Once Daily 2)  Lisinopril-Hydrochlorothiazide 20-12.5 Mg Tabs (Lisinopril-Hydrochlorothiazide) .Marland Kitchen.. 1 Tab Once Daily 3)  Aciphex 20 Mg Tbec (Rabeprazole Sodium) .... Once Daily 4)  Claritin 10 Mg Tabs (Loratadine) .Marland Kitchen.. 1 Tab Once Daily 5)  Centrum Silver  Tabs (Multiple Vitamins-Minerals) .Marland Kitchen.. 1 Tab Once Daily 6)  Caltrate 600+d 600-400 Mg-Unit Tabs (Calcium Carbonate-Vitamin D) .Marland Kitchen.. 1 Tab Once Daily 7)  Metoprolol Succinate 25 Mg Xr24h-Tab (Metoprolol Succinate) .... Take 1/2 Tablet Daily For 5 Days Then 1 Daily  Allergies (verified): No Known Drug Allergies  Past History:  Past Medical  History:    1.  Stress incontinence    2.  HTN    3.  Hyperlipidemia    4.  GERD    5.  Palpitations:      - Holter (4/10):  avg HR 62, minimal 35.  Occasional PACs.  Short runs of 4 beats SVT.  No longer runs.    Social History:    Full Time-legal assistant    Tobacco Use - No.     Alcohol Use - no.    Drug Use - No    2 soft drinks/day  Review of Systems       All systems reviewed and negative except as noted in HPI.   Vital Signs:  Patient profile:   60 year old female Height:      69 inches Weight:      194 pounds BMI:     28.75 Pulse rate:   58 / minute Pulse rhythm:   regular BP sitting:   110 / 71  (left arm) Cuff size:   regular  Vitals Entered By: Judithe Modest CMA (March 28, 2009 9:46 AM)  Physical Exam  General:  Well developed, well nourished, in no acute distress.  Mildly obese.  Head:  normocephalic and atraumatic Nose:  no deformity, discharge, inflammation, or lesions Mouth:  Teeth, gums and palate normal. Oral mucosa normal. Neck:  Neck supple, no JVD. No masses, thyromegaly or abnormal cervical nodes. Lungs:  Clear bilaterally  to auscultation and percussion. Heart:  Non-displaced PMI, chest non-tender; regular rate and rhythm, S1, S2 without murmurs, rubs or gallops. Carotid upstroke normal, no bruit. Pedals normal pulses. No edema, no varicosities. Abdomen:  Bowel sounds positive; abdomen soft and non-tender without masses, organomegaly, or hernias noted. No hepatosplenomegaly. Msk:  Back normal, normal gait. Muscle strength and tone normal. Extremities:  No clubbing or cyanosis. Neurologic:  Alert and oriented x 3. Skin:  Intact without lesions or rashes. Psych:  Normal affect.   Impression & Recommendations:  Problem # 1:  PALPITATIONS (ICD-785.1) These appear to be related to PACs and perhaps SVT.  Patient had a few short runs (4 beats) of SVT on Holter.  Pt finds the symptoms from the palpitations bothersome.  She occasionally feels that her  heart races for 5 minutes at a time but no long SVT runs were seen on 24 hour Holter.  She has no severe symptoms from the palpitations such as lightheadedness or syncope.  I cannot rule out longer runs of an SVT such as AVNRT from the current Holter results.  We talked about management strategies and determined that given her symptoms, we will try to start a low dose of beta blocker.  I will start Toprol XL 25 mg daily.  She will take 12.5 mg daily for the 1st 5 days to make sure that she tolerates it (HR average 62 on Holter which is ok but reached minimal HR of 35 and has possible FH of heart block--so will need to follow closely).  If she continues to have bothersome episodes lasting minutes at a time on beta blocker, I will set her up for a 3 week event monitor.  I will also check an echocardiogram to make sure that her LV function is normal.  She had TSH done at Dr Carolee Rota office, we will call to get the results.    Problem # 2:  HYPERTENSION, UNSPECIFIED (ICD-401.9) BP is at goal on current therapy.   Problem # 3:  HYPERLIPIDEMIA-MIXED (ICD-272.4) HDL is a bit low, LDL is ok.  She has been encouraged by Dr. Renne Crigler to increase her exercise level.    I will see Ms Riesgo back in 4 weeks to see how she is doing on the medication and to decide if we need an event monitor.  Prescriptions: METOPROLOL SUCCINATE 25 MG XR24H-TAB (METOPROLOL SUCCINATE) Take 1/2 tablet daily for 5 days then 1 daily  #30 x 6   Entered by:   Katina Dung, RN, BSN   Authorized by:   Marca Ancona, MD   Signed by:   Katina Dung, RN, BSN on 03/28/2009   Method used:   Print then Give to Patient   RxID:   [id number]

## 2011-01-18 NOTE — Assessment & Plan Note (Signed)
 Summary: 1 MO F/U   Visit Type:  Follow-up Primary Provider:  Merri Brunette, MD  CC:  pt is feeling much better.  History of Present Illness: 60 yo with history of HTN, hyperlipidemia, and palpitations returns for followup visit.  At last visit, patient had reported episodes of heart racing and "thudding" since about 12/09.  She also felt like her heart occasionally skips beats.   She had a Holter monitor done at Dr. Carolee Rota office (24 hr).  This showed frequent PACs and occasional short bursts (4-5 beats of SVT).  There were no long runs of SVT.  Average HR was 62 but minimal HR reached 35.  As Ms Varone was quite symptomatic from her palpitations, we started her on a low dose of Toprol XL, which she has tolerated well.  She reports only very rare palpitations now and feels that she is symptomatically much better.  Echocardiogram showed structurally normal heart.   Labs (4/10):  TSH normal  Current Medications (verified): 1)  Lipitor 10 Mg Tabs (Atorvastatin Calcium) .Marland Kitchen.. 1 Tab Once Daily 2)  Lisinopril-Hydrochlorothiazide 20-12.5 Mg Tabs (Lisinopril-Hydrochlorothiazide) .Marland Kitchen.. 1 Tab Once Daily 3)  Aciphex 20 Mg Tbec (Rabeprazole Sodium) .... Once Daily 4)  Claritin 10 Mg Tabs (Loratadine) .Marland Kitchen.. 1 Tab Once Daily 5)  Centrum Silver  Tabs (Multiple Vitamins-Minerals) .Marland Kitchen.. 1 Tab Once Daily 6)  Caltrate 600+d 600-400 Mg-Unit Tabs (Calcium Carbonate-Vitamin D) .Marland Kitchen.. 1 Tab Once Daily 7)  Metoprolol Succinate 25 Mg Xr24h-Tab (Metoprolol Succinate) .Marland Kitchen.. 1 Daily  Allergies: No Known Drug Allergies  Past History:  Past Medical History:    1.  Stress incontinence    2.  HTN    3.  Hyperlipidemia    4.  GERD    5.  Palpitations:      - Holter (4/10):  avg HR 62, minimal 35.  Occasional PACs.  Short runs of 4 beats SVT.  No longer runs.      6.  Echo (4/10):  EF 60% with normal-appearing LV.  No significant valvular abnormalities.  PASP 28 mmHg.   Family History:    Reviewed history from  03/25/2009 and no changes required:       Father: Probable heart block       Mother:Hx of Breast CA, Colon CA       Siblings: Good health  Social History:    Reviewed history from 03/28/2009 and no changes required:       Full Time-legal assistant       Tobacco Use - No.        Alcohol Use - no.       Drug Use - No       2 soft drinks/day  Vital Signs:  Patient profile:   60 year old female Height:      69 inches Weight:      193 pounds Pulse rate:   53 / minute BP sitting:   122 / 78  (left arm)  Vitals Entered By: Burnett Kanaris, CMA (Apr 26, 2009 9:18 AM)  Physical Exam  General:  Well developed, well nourished, in no acute distress. Neck:  Neck supple, no JVD. No masses, thyromegaly or abnormal cervical nodes. Lungs:  Clear bilaterally to auscultation and percussion. Heart:  Non-displaced PMI, chest non-tender; regular rate and rhythm, S1, S2 without murmurs, rubs or gallops. Carotid upstroke normal, no bruit. Pedals normal pulses. No edema, no varicosities. Abdomen:  Bowel sounds positive; abdomen soft and non-tender without masses, organomegaly, or hernias  noted. No hepatosplenomegaly. Extremities:  No clubbing or cyanosis. Psych:  Normal affect.   Impression & Recommendations:  Problem # 1:  PALPITATIONS (ICD-785.1) Patient initially presented with very short runs of SVT on holter and palpitations.  TSH was normal and echo showed structurally normal heart.  I started her on a low dose of Toprol XL which she appears to have tolerated well with significant decrease in symptoms.  No further workup is needed at this time unless patient has break-through SVT on Toprol XL.  I will plan on seeing her back in a year to see how she is doing.   Problem # 2:  HYPERTENSION, UNSPECIFIED (ICD-401.9) BP is at goal on current meds.   Problem # 3:  HYPERLIPIDEMIA-MIXED (ICD-272.4) HDL is a bit low, LDL is ok.  She has been encouraged by Dr. Renne Crigler to increase her exercise level.     Patient Instructions: 1)  Your physician recommends that you schedule a follow-up appointment in: 12 MONTHS

## 2011-01-18 NOTE — Progress Notes (Signed)
 Summary: echo results called to pt  Phone Note Call from Patient Call back at [phone]   Caller: Patient Reason for Call: Talk to Nurse Summary of Call: call pt [phone] she wants to know the result of her echo cardiogram test. Initial call taken by: Ammie del Tomasa Rand,  April 07, 2009 8:55 AM  Follow-up for Phone Call        Spoke with pt and reviewed the results of her echocardiogram. Follow-up by: Julieta Gutting, RN, BSN,  April 07, 2009 9:18 AM

## 2011-04-16 ENCOUNTER — Other Ambulatory Visit: Payer: Self-pay | Admitting: Cardiology

## 2011-05-04 NOTE — Discharge Summary (Signed)
[facility]  Patient:    Jacqueline [name], Jacqueline [name] Visit Number: [id number] MRN: [id number]          Service Type: GYN Location: [id number] Attending Physician:  Lendon Colonel Dictated by:   Kathie Rhodes. Kyra Manges, M.D. Admit Date:  09/01/2001 Discharge Date: 09/03/2001                             Discharge Summary  ADMISSION DIAGNOSIS:  Genital prolapse with stress incontinence and urge incontinence.  DISCHARGE DIAGNOSIS:  Genital prolapse with stress incontinence and urge incontinence.  OPERATION PERFORMED:  Burch procedure along with posterior repair.  BRIEF HISTORY:  Ms. Purdie is a 60 year old female, status post vaginal hysterectomy, A&P repair, who has recurrent stress incontinence in the fact that she wears a pad almost all the time. She leaks with coughing and sneezing. Sometimes she leaks with just body motion. She noted that the medication Detrol gave her no change. After appropriate consult and consent, she was admitted to the hospital for Burch procedure and posterior repair. She was fully informed of the risk and benefits and failure rates. Admission hemoglobin was 13, hematocrit 39.  HOSPITAL COURSE:  The patient was admitted to the hospital and underwent a Burch procedure and posterior repair. Her postoperative course was uncomplicated. She remained afebrile and without complaint. She was discharged on September 18.  DISCHARGE MEDICATIONS:   Macrobid and Pyridium.  DISCHARGE FOLLOWUP:   She will return to the office in two weeks.  DISCHARGE INSTRUCTIONS:  She is to call for fever, bleeding, or any other difficulties she may encounter.  CONDITION ON DISCHARGE:  Improved. Dictated by:   S. Kyra Manges, M.D. Attending Physician:  Lendon Colonel DD:  09/10/01 TD:  09/10/01 Job: [id number]

## 2011-05-04 NOTE — H&P (Signed)
[facility]  Patient:    Jacqueline [name], Jacqueline [name] Visit Number: [id number] MRN: [id number]          Service Type: Attending:  Katherine Roan, M.D. Dictated by:   S. Kyra Manges, M.D.                           History and Physical  CHIEF COMPLAINT:  Stress incontinence and frequency.  HISTORY OF PRESENT ILLNESS:  Jacqueline [name] is a 60 year old female who is status post vaginal hysterectomy, A&P repair, who was noted in January to have recurrence of stress incontinence. She has to wear a pad all the time, leaks urine with coughing, sneezing, and sometimes either with body motion she has considerable amount of pelvic pressure. She had a strong family history of colon cancer at that time. She also noted that with Detrol this was no change in her incontinence. The recommendation on that visit was to continue her Gynadiol, which was her hormone replacement therapy, to have a colonoscopy, which she did and this was normal, and to proceed with urethral suspension along with posterior repair. She is a gravida 3, para 2. She is status post vag hyst, A&P repair. She is on Claritin and Caltrate. She has intermittent areas of irritation.  REVIEW OF SYSTEMS:  HEENT: She wears contacts but no has no decrease in visual or auditory acuity. No dizziness, no frequent headaches. HEART: No hypertension, no rheumatic fever, no chest pain or shortness of breath. No history of mitral valve prolapse or heart murmur. LUNGS:  No chronic cough, no asthma. GU: She has frequent urinary tract infections. Recent urine culture was negative. GI: See present illness. Recent colonoscopy was negative. She has really no bowel habit change. No melena, no anorexia, no weight loss. MUSCLES/BONES/JOINTS: No fractures or arthritis.  SOCIAL HISTORY:  She is a Librarian, academic. She does not smoke or drink.  FAMILY HISTORY:  Mother is living at 71 and her father is living and well at 109. She has two  sisters and one brother who are in good health. Her mother had breast cancer and colon cancer. Her father has a slow heart rate, probable heart block. Paternal uncle with heart disease. Both grandmothers are diabetic.  PHYSICAL EXAMINATION:  GENERAL:  Well-developed, nourished female who appears to be her stated age of 33. She is oriented to time, place, and recent events.  VITAL SIGNS:  Weight 181. Blood pressure 140/80, pulse is 80.  HEENT:  Examination of the ears, nose and throat are unremarkable. Oropharynx is not injected. Pupils are round and regular and react to light and accommodation.  NECK:  Supple. Thyroid is not enlarged. Trachea is in the midline. Carotid pulses are equal without bruits.  BREASTS:  No masses or tenderness.  LUNGS:  Clear to P&A. Diaphragm moves well with inspiration and expiration.  HEART:  Normal sinus rhythm, no murmurs.  ABDOMEN:  Soft. Liver, spleen, and kidneys are not palpated. Bowel sounds are normal. No tenderness.  EXTREMITIES:  Good range of motion. Equal pulses and reflexes.  PELVIC:  Second degree cystocele with marked amount of urethral mobility. There is a moderate amount of rectocele present.  RECTAL:  Hemoccult is negative.  IMPRESSION:  Recurrent stress urinary incontinence with a factor of urge incontinence.  PLAN:   Burch procedure and posterior repair. Risks, benefits, and failure rates have been discussed with Jacqueline [name]. She has been apprised of the risk of the procedure including damage  to bowel, bladder, vascular injuries, and above all, failure rates.  FINAL IMPRESSION:  Primary stress incontinence with cystocele and rectocele and urethral mobility. Dictated by:   S. Kyra Manges, M.D. Attending:  Katherine Roan, M.D. DD:  08/31/01 TD:  08/31/01 Job: [id number]

## 2011-05-04 NOTE — Procedures (Signed)
[facility]  Patient:    Jacqueline [name], Jacqueline [name]                         MRN: [id number] Proc. Date: 04/07/01 Adm. Date:  [id number] Attending:  Louie Bun CC:         Katherine Roan, M.D.   Procedure Report  PROCEDURE:  Colonoscopy.  INDICATIONS FOR PROCEDURE:  Family history of colon cancer in a first degree relative in this 60 year old patient with no prior colon screening.  DESCRIPTION OF PROCEDURE:  The patient was placed in the left lateral decubitus position and placed on the pulse monitored with continuous low flow oxygen delivered by nasal cannula. She was sedated with 70 mg IV Demerol and 7 mg IV Versed. The Olympus video colonoscope was inserted into the rectum and advanced to the cecum, confirmed by transillumination at McBurneys point and visualization of the ileocecal valve and appendiceal orifice. The prep was good. The cecum, ascending, transverse, descending, sigmoid and rectum all appeared normal with no masses, polyps, diverticula or other mucosal abnormalities. Retroflexed view of the anus did reveal some small internal hemorrhoids and there were some external hemorrhoids seen on withdrawal as well. The colonoscope was then withdrawn and the patient returned to the recovery room in stable condition. The patient tolerated the procedure well and there were no immediate complications.  IMPRESSION:  Internal and external hemorrhoids otherwise normal colonoscopy.  PLAN:  Repeat colonoscopy in five years based on her family history. DD:  04/07/01 TD:  04/07/01 Job: [id number]

## 2011-05-04 NOTE — Op Note (Signed)
[facility]  Patient:    Jacqueline [name], Jacqueline [name] Visit Number: [id number] MRN: [id number]          Service Type: GYN Location: 1S 0003 01 Attending Physician:  Lendon Colonel Dictated by:   Kathie Rhodes. Kyra Manges, M.D. Proc. Date: 09/01/01 Admit Date:  09/01/2001                             Operative Report  PREOPERATIVE DIAGNOSIS:  Recurrent stress incontinence.  POSTOPERATIVE DIAGNOSIS:  Recurrent stress incontinence.  OPERATION:  Burch procedure along with posterior repair.  SURGEON:  Katherine Roan, M.D.  DESCRIPTION OF PROCEDURE:  Patient was placed in the combined position with Allen stirrups, prepped and draped in usual fashion.  A transverse incision was made just above the symphysis.  The rectus muscles were divided right at the insertion in the symphysis.  The retropubic space was then carefully dissected and the vaginal wall were identified and sutured to the inguinal ligament with two sutures on each side of 2-0 Ethibond.  A Lapides-like repair was continued anteriorly by attaching the anterior vaginal vault to the symphysis as well.  Hemostasis was secure.  The rectus muscles were then sutured into the anterior rectus sheath inferiorly and the rectus sheath was closed with 2-0 Ethibond and the rectus sheath was then closed with a running-locking suture of 2-0 PDS.  Hemostasis was secure.  The subcutaneum was closed with 4-0 PDS.  I then went down below and dissected the rectocele, which was quite high, just above the previous posterior repair and I plicated the fascia in the midline with interrupted sutures of 3-0 Vicryl.  The vagina was then excised and closed with a locking suture of 3-0 chromic.  Hemostasis was secure.  The vagina was packed.  Catheter drained clear urine.  She was awakened and carried to recovery room in good condition.  The abdominal incision was infiltrated with 0.5% Marcaine with epinephrine. Dictated by:   S.  Kyra Manges, M.D. Attending Physician:  Lendon Colonel DD:  09/01/01 TD:  09/01/01 Job: [id number]

## 2011-06-17 ENCOUNTER — Other Ambulatory Visit: Payer: Self-pay | Admitting: Cardiology

## 2011-07-05 ENCOUNTER — Ambulatory Visit (INDEPENDENT_AMBULATORY_CARE_PROVIDER_SITE_OTHER): Payer: BC Managed Care – PPO | Admitting: Sports Medicine

## 2011-07-05 ENCOUNTER — Encounter: Payer: Self-pay | Admitting: Sports Medicine

## 2011-07-05 VITALS — BP 114/71 | HR 41 | Ht 66.5 in | Wt 190.0 lb

## 2011-07-05 DIAGNOSIS — M722 Plantar fascial fibromatosis: Secondary | ICD-10-CM | POA: Insufficient documentation

## 2011-07-05 NOTE — Assessment & Plan Note (Addendum)
Diagnosis of plantar fasciitis based on clinical history and ultrasound examination. Plan to use heel cups and arch pads.  We'll also use exercise program including stretching eccentric exercise of the calf and arch and icing. Patient will return to clinic in 6 weeks for repeat ultrasound examination.  Will use NSAIDs as needed.

## 2011-07-05 NOTE — Progress Notes (Signed)
Ms. Hagger presents to clinic today complaining of left heel pain for 2 months. The pain started insidiously in the center of the heel. It's worse in the mornings however it continues to happen during the day. She has tried ibuprofen and some stretching exercises that she's used for plantar fasciitis of the other foot. However these but has not helped. She denies any trauma or change in activity preceding her pain. She states that it hurts but it does not feel exactly like a plantar fasciitis that she had in her right foot. Initial PF problems took 6 mos to resolve under care of Dr Wynelle Cleveland.  Past medical history reviewed  Exam vital signs reviewed Left foot: Normal appearing without defect or excessive callus formation. Longitudinal arch is normal appearing and collapses minimally with standing. On palpation patient is mildly tender over the middle aspect of her calcaneous on the plantar aspect.  Her calf is normal appearing. Her large toe has normal range of motion. Right foot: Normal-appearing.  Ultrasound examination left foot: Plantar fascia visualized and found to be 0.56 cm. A bone spur was visualized abutting  the inferior portion of the plantar fascia.  Right foot plantar fascia shown to be 0.41 cm however a large bone spur was again noted on the plantar aspect of the calcaneus. Achilles on the right was 0.4 cm as well. Hypoechoic areas were noted within the plantar fascia bilaterally

## 2011-07-05 NOTE — Patient Instructions (Signed)
Thank you for coming in today. Do the stretching 3 times day.  Ice your foot for 10-15 mins 1-2 times day in a bucket of ice water (ouch) Do the elevated heel raises 3 sets of 15 two to three times a day.  Continue IBUPROFEN for Pain.  Don't do any activity that has a higher than 3/10 pain.  Come back in 6 weeks or so.  Good luck.

## 2011-07-10 ENCOUNTER — Telehealth: Payer: Self-pay | Admitting: *Deleted

## 2011-07-10 MED ORDER — METOPROLOL SUCCINATE ER 25 MG PO TB24: 25.0000 mg | ORAL_TABLET | Freq: Every day | ORAL | Status: DC

## 2011-07-10 NOTE — Telephone Encounter (Signed)
I received message pt needs refill on metoprolol xl 25mg  daily. Last office visit with Dr Shirlee Latch 04/26/09. Pt has appt with Dr Shirlee Latch 08/22/11. I will refill metoprolol until to last until appt with Dr Shirlee Latch 08/22/11.

## 2011-07-17 ENCOUNTER — Other Ambulatory Visit: Payer: Self-pay | Admitting: Cardiology

## 2011-07-23 ENCOUNTER — Encounter: Payer: Self-pay | Admitting: Cardiology

## 2011-08-16 ENCOUNTER — Ambulatory Visit (INDEPENDENT_AMBULATORY_CARE_PROVIDER_SITE_OTHER): Payer: BC Managed Care – PPO | Admitting: Sports Medicine

## 2011-08-16 VITALS — BP 103/66

## 2011-08-16 DIAGNOSIS — M722 Plantar fascial fibromatosis: Secondary | ICD-10-CM

## 2011-08-16 NOTE — Progress Notes (Signed)
  Subjective:    Patient ID: Jacqueline [name], female    DOB: July 19, 1951, 60 y.o.   MRN: [id number]  HPI Jacqueline [name] is coming today for followup on her left foot plantar fasciitis. She has improved. She states that she is 60% better. She has been doing her plantar fascia stretches. She has been icing her plantar fascia as well .she has been taking ibuprofen for pain. She states that her pain now is 2/10, dull ache, located on her left heel sole, on and off, worse with standing for long periods of time. Improvement by the previous mentioned treatment measurements. No radiation. She is walking without a limp or. No numbness or tingling distally  Patient Active Problem List  Diagnoses  . HYPERLIPIDEMIA-MIXED  . HYPERTENSION, UNSPECIFIED  . FEMALE STRESS INCONTINENCE  . PALPITATIONS  . Plantar fasciitis   Current Outpatient Prescriptions on File Prior to Visit  Medication Sig Dispense Refill  . ACIPHEX 20 MG tablet Take 20 mg by mouth daily.      Marland Kitchen atorvastatin (LIPITOR) 20 MG tablet Take 20 mg by mouth daily.      . Calcium Carbonate (CALTRATE 600 PO) Take 1 tablet by mouth daily.        . cetirizine (ZYRTEC) 10 MG tablet Take 10 mg by mouth daily.        Marland Kitchen lisinopril-hydrochlorothiazide (PRINZIDE,ZESTORETIC) 20-12.5 MG per tablet Take 1 tablet by mouth daily.      Marland Kitchen loratadine (CLARITIN) 10 MG tablet Take 10 mg by mouth daily.        . metoprolol succinate (TOPROL-XL) 25 MG 24 hr tablet Take 1 tablet (25 mg total) by mouth daily.  30 tablet  1  . metoprolol succinate (TOPROL-XL) 25 MG 24 hr tablet TAKE 1 TABLET BY MOUTH EVERY DAY  30 tablet  6  . Multiple Vitamins-Minerals (CENTRUM PO) Take 1 tablet by mouth daily.         No Known Allergies  Review of Systems  Constitutional: Negative for fever, chills and fatigue.  Musculoskeletal: Negative for back pain, joint swelling, arthralgias and gait problem.       Left heel pain with HPI  Neurological: Negative for weakness and numbness.       Objective:   Physical Exam  Constitutional: She appears well-developed and well-nourished.  Pulmonary/Chest: Effort normal.  Musculoskeletal:       Left heel with intact skin, no swelling, no hematoma There is mild tenderness to palpation in the mid plantar aspect of the left heel at the insertion of the plantar fascia and the calcaneus. There no tenderness at the plantar fascia in the mid arch. Gait w/o a limp  Neurological: She is alert.  Skin: Skin is warm. No rash noted. No erythema. No pallor.  Psychiatric: She has a normal mood and affect. Her behavior is normal. Judgment and thought content normal.   MSK U/S : Left heel plantar fascia measuring and a 0.42, previous ultrasound measured 0.56. There is still residual swelling of the plantar fascia, with improvement.     Assessment & Plan:   1. Plantar fasciitis    Do the stretching 3 times day.  Ice your foot for 10-15 mins 1-2 times a day. Do the elevated heel raises 3 sets of 15 two to three times a day.  Continue IBUPROFEN for Pain.  Don't do any activity that has a higher than 3/10 pain.  Come back in 6 weeks or so.

## 2011-08-22 ENCOUNTER — Encounter: Payer: Self-pay | Admitting: Cardiology

## 2011-08-22 ENCOUNTER — Ambulatory Visit (INDEPENDENT_AMBULATORY_CARE_PROVIDER_SITE_OTHER): Payer: BC Managed Care – PPO | Admitting: Cardiology

## 2011-08-22 DIAGNOSIS — R002 Palpitations: Secondary | ICD-10-CM

## 2011-08-22 DIAGNOSIS — I10 Essential (primary) hypertension: Secondary | ICD-10-CM

## 2011-08-22 DIAGNOSIS — I2510 Atherosclerotic heart disease of native coronary artery without angina pectoris: Secondary | ICD-10-CM

## 2011-08-22 MED ORDER — METOPROLOL SUCCINATE ER 25 MG PO TB24: 25.0000 mg | ORAL_TABLET | Freq: Every day | ORAL | Status: DC

## 2011-08-22 NOTE — Patient Instructions (Signed)
You do not need to schedule a follow-up appt with Dr McLean. 

## 2011-08-23 NOTE — Progress Notes (Signed)
PCP: Dr. Renne Crigler  60 yo with history of HTN, hyperlipidemia, and palpitations returns for followup visit. Patient was having bothersome palpitations before a prior visit, and holter monitor showed occasional PACs and short runs of SVT.  Echo showed a structurally normal heart.  Patient was started on Toprol XL 25 mg daily, and symptoms almost completely resolved.  She continues to have minimal palpitations.  She watches her caffeine intake.  Blood pressure has been controlled when she has checked it at home.  No chest pain or exertional dyspnea.   Labs (4/10): TSH normal   Allergies: No Known Drug Allergies   Past Medical History:  1. Stress incontinence  2. HTN  3. Hyperlipidemia  4. GERD  5. Palpitations:  - Holter (4/10): avg HR 62, minimal 35. Occasional PACs. Short runs of 4 beats SVT. No longer runs.  6. Echo (4/10): EF 60% with normal-appearing LV. No significant valvular abnormalities. PASP 28 mmHg.   Family History:  Father: Probable heart block  Mother:Hx of Breast CA, Colon CA  Siblings: Good health   Social History:  Full Time-legal assistant  Tobacco Use - No.  Alcohol Use - no.  Drug Use - No   Current Outpatient Prescriptions  Medication Sig Dispense Refill  . ACIPHEX 20 MG tablet Take 20 mg by mouth daily.      Marland Kitchen atorvastatin (LIPITOR) 20 MG tablet Take 10 mg by mouth daily.       . Calcium Carbonate (CALTRATE 600 PO) Take 1 tablet by mouth daily.        . cetirizine (ZYRTEC) 10 MG tablet Take 10 mg by mouth daily.        Marland Kitchen lisinopril-hydrochlorothiazide (PRINZIDE,ZESTORETIC) 20-12.5 MG per tablet Take 1 tablet by mouth daily.      . Multiple Vitamins-Minerals (CENTRUM PO) Take 1 tablet by mouth daily.        . metoprolol succinate (TOPROL XL) 25 MG 24 hr tablet Take 1 tablet (25 mg total) by mouth daily.  30 tablet  11    BP 132/74  Pulse 60  Ht 5' 6.5" (1.689 m)  Wt 196 lb 12.8 oz (89.268 kg)  BMI 31.29 kg/m2 General: NAD Neck: No JVD, no thyromegaly or  thyroid nodule.  Lungs: Clear to auscultation bilaterally with normal respiratory effort. CV: Nondisplaced PMI.  Heart regular S1/S2, no S3/S4, no murmur.  No peripheral edema.  No carotid bruit.  Normal pedal pulses.  Abdomen: Soft, nontender, no hepatosplenomegaly, no distention.  Neurologic: Alert and oriented x 3.  Psych: Normal affect. Extremities: No clubbing or cyanosis.

## 2011-08-23 NOTE — Assessment & Plan Note (Signed)
PACs and short runs of SVT by holter.  Symptoms have almost completely resolved on Toprol XL.  Will plan to continue Toprol.  She can followup in this office as needed in the future.

## 2011-09-12 ENCOUNTER — Other Ambulatory Visit: Payer: Self-pay | Admitting: Cardiology

## 2011-09-26 ENCOUNTER — Ambulatory Visit: Payer: BC Managed Care – PPO | Admitting: Sports Medicine

## 2011-11-12 ENCOUNTER — Other Ambulatory Visit: Payer: Self-pay | Admitting: Cardiology

## 2012-02-11 ENCOUNTER — Other Ambulatory Visit: Payer: Self-pay | Admitting: Cardiology

## 2012-04-16 ENCOUNTER — Other Ambulatory Visit (HOSPITAL_COMMUNITY)
Admission: RE | Admit: 2012-04-16 | Discharge: 2012-04-16 | Disposition: A | Discharge status: Home / Self Care | Payer: BC Managed Care – PPO | Source: Ambulatory Visit | Attending: Internal Medicine | Admitting: Internal Medicine

## 2012-04-16 DIAGNOSIS — Z1159 Encounter for screening for other viral diseases: Secondary | ICD-10-CM | POA: Insufficient documentation

## 2012-04-16 DIAGNOSIS — Z01419 Encounter for gynecological examination (general) (routine) without abnormal findings: Secondary | ICD-10-CM | POA: Insufficient documentation

## 2012-09-07 ENCOUNTER — Other Ambulatory Visit: Payer: Self-pay | Admitting: Cardiology

## 2012-09-09 ENCOUNTER — Other Ambulatory Visit: Payer: Self-pay | Admitting: Cardiology

## 2012-11-09 ENCOUNTER — Other Ambulatory Visit: Payer: Self-pay | Admitting: Cardiology

## 2013-06-05 ENCOUNTER — Other Ambulatory Visit: Payer: Self-pay | Admitting: Cardiology

## 2013-06-14 ENCOUNTER — Other Ambulatory Visit: Payer: Self-pay | Admitting: Cardiology

## 2015-04-11 ENCOUNTER — Encounter: Payer: Self-pay | Admitting: *Deleted

## 2015-04-13 ENCOUNTER — Encounter: Payer: Self-pay | Admitting: Cardiology

## 2015-04-13 ENCOUNTER — Ambulatory Visit (INDEPENDENT_AMBULATORY_CARE_PROVIDER_SITE_OTHER): Payer: 59 | Admitting: Cardiology

## 2015-04-13 VITALS — BP 120/70 | HR 68 | Ht 66.5 in | Wt 202.0 lb

## 2015-04-13 DIAGNOSIS — I471 Supraventricular tachycardia, unspecified: Secondary | ICD-10-CM | POA: Insufficient documentation

## 2015-04-13 DIAGNOSIS — R002 Palpitations: Secondary | ICD-10-CM

## 2015-04-13 DIAGNOSIS — I10 Essential (primary) hypertension: Secondary | ICD-10-CM

## 2015-04-13 NOTE — Patient Instructions (Signed)
Medication Instructions:   Your physician recommends that you continue on your current medications as directed. Please refer to the Current Medication list given to you today.     Testing/Procedures:  Your physician has recommended that you wear a 48 hour holter monitor. Holter monitors are medical devices that record the heart's electrical activity. Doctors most often use these monitors to diagnose arrhythmias. Arrhythmias are problems with the speed or rhythm of the heartbeat. The monitor is a small, portable device. You can wear one while you do your normal daily activities. This is usually used to diagnose what is causing palpitations/syncope (passing out).    Follow-Up:  Your physician recommends that you schedule a follow-up appointment in:  2 [place] AT THAT TIME

## 2015-04-13 NOTE — Progress Notes (Signed)
Patient ID: Jacqueline [name], female   DOB: 08-Jan-1951, 64 y.o.   MRN: [id number]       Cardiology Office Note   Date:  04/13/2015   ID:  Jacqueline [name], DOB 1951-04-24, MRN [id number]  PCP:  Horatio Pel, MD  Cardiologist:   Dorothy Spark, MD   Chief Complaint  Patient presents with  . Appointment    irregular HR more often.    Chief complaint: Palpitations   History of Present Illness: Jacqueline [name] is a 64 y.o. female who presents for evaluation of palpitations. The patient is a very pleasant 64 year old female who was previously seen by Dr. Aundra Dubin in 2010, when she was experiencing palpitations at the time and Holter monitor showed occasional PACs and short run of 4 beats of SVT no longer runs. Her echocardiogram in time showed left ventricular ejection fraction of 60% with normal-appearing LV and no significant valvular abnormalities her right-sided pressures were normal with RVSP 28 mmHg. Patient was started on low-dose of Toprol-XL 25 mg daily and her symptoms improved significantly. She has been doing well for multiple years until recently when her symptoms became more frequent and longer. She describes palpitations that start all sudden terminate all sudden and can last up to half an hour and are not associated with any dizziness chest pain or presyncopal episode. She denies any chest pain or shortness of breath she is very active and otherwise asymptomatic. Lately her palpitations episodes have been happening about 3 times a week.   Past Medical History  Diagnosis Date  . Stress incontinence, female   . HTN (hypertension)   . Hyperlipidemia   . GERD (gastroesophageal reflux disease)   . Palpitations     Past Surgical History  Procedure Laterality Date  . Vaginal hysterectomy    . Anterior and posterior vaginal repair    . Burch procedure       Current Outpatient Prescriptions  Medication Sig Dispense Refill  . ACIPHEX 20 MG tablet Take 20 mg by mouth  daily.    Marland Kitchen atorvastatin (LIPITOR) 20 MG tablet Take 10 mg by mouth daily.     . Calcium Carbonate (CALTRATE 600 PO) Take 1 tablet by mouth daily.      . cetirizine (ZYRTEC) 10 MG tablet Take 10 mg by mouth daily.      . Cranberry 405 MG CAPS Take by mouth daily.    Marland Kitchen lisinopril-hydrochlorothiazide (PRINZIDE,ZESTORETIC) 20-12.5 MG per tablet Take 1 tablet by mouth daily.    . metoprolol succinate (TOPROL-XL) 25 MG 24 hr tablet TAKE 1 TABLET BY MOUTH DAILY. 30 tablet 6  . Multiple Vitamins-Minerals (CENTRUM PO) Take 1 tablet by mouth daily.      . pramipexole (MIRAPEX) 0.125 MG tablet Take 1-2 tablets by mouth every evening. 3 hours before bedtime  2   No current facility-administered medications for this visit.    Allergies:   Review of patient's allergies indicates no known allergies.    Social History:  The patient  reports that she has never smoked. She has never used smokeless tobacco. She reports that she does not drink alcohol or use illicit drugs.   Family History:  The patient's family history includes Bradycardia in her father; Breast cancer in her mother; Colon cancer in her mother; Diabetes in her maternal grandmother and paternal grandmother; Heart block in her father; Heart disease in her paternal uncle.    ROS:  Please see the history of present illness.   All other  systems are reviewed and negative.    PHYSICAL EXAM: VS:  BP 120/70 mmHg  Pulse 68  Ht 5' 6.5" (1.689 m)  Wt 202 lb (91.627 kg)  BMI 32.12 kg/m2 , BMI Body mass index is 32.12 kg/(m^2). GEN: Well nourished, well developed, in no acute distress HEENT: normal Neck: no JVD, carotid bruits, or masses Cardiac: RRR; no murmurs, rubs, or gallops,no edema  Respiratory:  clear to auscultation bilaterally, normal work of breathing GI: soft, nontender, nondistended, + BS MS: no deformity or atrophy Skin: warm and dry, no rash Neuro:  Strength and sensation are intact Psych: euthymic mood, full affect  EKG:  EKG  is ordered today. The ekg ordered today demonstrates sinus rhythm with PACs. Otherwise normal EKG unchanged from 2010.  Recent Labs: No results found for requested labs within last 365 days.    Lipid Panel No results found for: CHOL, TRIG, HDL, CHOLHDL, VLDL, LDLCALC, LDLDIRECT    Wt Readings from Last 3 Encounters:  04/13/15 202 lb (91.627 kg)  08/22/11 196 lb 12.8 oz (89.268 kg)  07/05/11 190 lb (86.183 kg)      Other studies Reviewed: Additional studies/ records that were reviewed today include: Holter monitor from 2010. Review of the above records demonstrates: As described in history of present illness   ASSESSMENT AND PLAN:  64 year old female  1. Palpitations - prior history of documented short runs of SVT, we will repeat 48 hour Holter monitor for to further evaluate. If SVT confirmed we will refer to EP. She recently had all of her labs checked at her primary care physician we will request.  2. Hypertension - controlled on current regimen  3. Hyperlipidemia - atorvastatin 20 mg daily, we'll request labs from her primary care physician to evaluate for her lipids.  The following changes have been made:   Labs/ tests ordered today include:  Orders Placed This Encounter  Procedures  . Holter monitor - 48 hour  . EKG 12-Lead   Follow-up in 2 months.  Signed, Dorothy Spark, MD  04/13/2015 5:39 PM    [facility] [address], [place], [place]  [address] Phone: [phone]; Fax: [phone]

## 2015-04-15 ENCOUNTER — Encounter (INDEPENDENT_AMBULATORY_CARE_PROVIDER_SITE_OTHER): Payer: 59

## 2015-04-15 ENCOUNTER — Encounter: Payer: Self-pay | Admitting: Radiology

## 2015-04-15 DIAGNOSIS — I471 Supraventricular tachycardia, unspecified: Secondary | ICD-10-CM

## 2015-04-15 DIAGNOSIS — R002 Palpitations: Secondary | ICD-10-CM

## 2015-04-15 NOTE — Progress Notes (Signed)
Patient ID: Jacqueline [name], female   DOB: 09-Feb-1951, 64 y.o.   MRN: [id number] [facility] 48hr holter applied

## 2015-05-10 ENCOUNTER — Telehealth: Payer: Self-pay | Admitting: *Deleted

## 2015-05-10 NOTE — Telephone Encounter (Signed)
Contacted the pt to inform her of her 48 hour holter monitor results showing new onset a-fib per Dr Meda Coffee.  Informed the pt that per Dr Meda Coffee she would like for the pt to come in for a follow-up OV this Thursday 05/12/15 at 0900.  Pt verbalized understanding and agrees with this plan.

## 2015-05-12 ENCOUNTER — Ambulatory Visit (INDEPENDENT_AMBULATORY_CARE_PROVIDER_SITE_OTHER): Payer: 59 | Admitting: Cardiology

## 2015-05-12 ENCOUNTER — Encounter: Payer: Self-pay | Admitting: Cardiology

## 2015-05-12 VITALS — BP 110/52 | HR 54 | Ht 66.5 in | Wt 202.0 lb

## 2015-05-12 DIAGNOSIS — I4891 Unspecified atrial fibrillation: Secondary | ICD-10-CM | POA: Insufficient documentation

## 2015-05-12 DIAGNOSIS — Z7189 Other specified counseling: Secondary | ICD-10-CM

## 2015-05-12 DIAGNOSIS — I480 Paroxysmal atrial fibrillation: Secondary | ICD-10-CM | POA: Diagnosis not present

## 2015-05-12 DIAGNOSIS — I10 Essential (primary) hypertension: Secondary | ICD-10-CM

## 2015-05-12 MED ORDER — RIVAROXABAN 20 MG PO TABS: 20.0000 mg | ORAL_TABLET | Freq: Every day | ORAL | Status: DC

## 2015-05-12 MED ORDER — FLECAINIDE ACETATE 50 MG PO TABS: 50.0000 mg | ORAL_TABLET | Freq: Two times a day (BID) | ORAL | Status: DC

## 2015-05-12 NOTE — Patient Instructions (Addendum)
Medication Instructions:   STOP TAKING TOPROL XL NOW  START TAKING FLECAINIDE 50 MG TWICE DAILY  START TAKING XARELTO 20 MG ONCE DAILY WITH SUPPER    Testing/Procedures:  Your physician has requested that you have an exercise tolerance test. For further information please visit [email or web address]. Please also follow instruction sheet, as given.  PER DR NELSON THIS MUST BE SCHEDULED FOR NEXT WEEK DUE TO STARTING FLECAINIDE   Your physician has requested that you have an echocardiogram. Echocardiography is a painless test that uses sound waves to create images of your heart. It provides your doctor with information about the size and shape of your heart and how well your heart's chambers and valves are working. This procedure takes approximately one hour. There are no restrictions for this procedure.    Follow-Up:  3 MONTHS WITH DR Meda Coffee

## 2015-05-12 NOTE — Progress Notes (Signed)
Patient ID: Jacqueline [name], female   DOB: 05-Jul-1951, 64 y.o.   MRN: [id number]      Cardiology Office Note  Date:  05/12/2015   ID:  Jacqueline [name], DOB 1951-09-06, MRN [id number]  PCP:  Horatio Pel, MD  Cardiologist:   Dorothy Spark, MD   No chief complaint on file.  Chief complaint: Palpitations   History of Present Illness: Jacqueline [name] is a 65 y.o. female who presents for evaluation of palpitations. The patient is a very pleasant 64 year old female who was previously seen by Dr. Aundra Dubin in 2010, when she was experiencing palpitations at the time and Holter monitor showed occasional PACs and short run of 4 beats of SVT no longer runs. Her echocardiogram in time showed left ventricular ejection fraction of 60% with normal-appearing LV and no significant valvular abnormalities her right-sided pressures were normal with RVSP 28 mmHg. Patient was started on low-dose of Toprol-XL 25 mg daily and her symptoms improved significantly. She has been doing well for multiple years until recently when her symptoms became more frequent and longer. She describes palpitations that start all sudden terminate all sudden and can last up to half an hour and are not associated with any dizziness chest pain or presyncopal episode. She denies any chest pain or shortness of breath she is very active and otherwise asymptomatic. Lately her palpitations episodes have been happening about 3 times a week.  05/12/2015 - the patient was found to have a-fib on Holter monitor, she reports frequent palpitations lasting up to 30 minutes, no associated CP, dizziness or syncope.    Past Medical History  Diagnosis Date  . Stress incontinence, female   . HTN (hypertension)   . Hyperlipidemia   . GERD (gastroesophageal reflux disease)   . Palpitations     Past Surgical History  Procedure Laterality Date  . Vaginal hysterectomy    . Anterior and posterior vaginal repair    . Burch procedure       Current  Outpatient Prescriptions  Medication Sig Dispense Refill  . ACIPHEX 20 MG tablet Take 20 mg by mouth daily.    Marland Kitchen atorvastatin (LIPITOR) 20 MG tablet Take 10 mg by mouth daily.     . Calcium Carbonate (CALTRATE 600 PO) Take 1 tablet by mouth daily.      . cetirizine (ZYRTEC) 10 MG tablet Take 10 mg by mouth daily.      . Cranberry 405 MG CAPS Take by mouth daily.    Marland Kitchen lisinopril-hydrochlorothiazide (PRINZIDE,ZESTORETIC) 20-12.5 MG per tablet Take 1 tablet by mouth daily.    . Multiple Vitamins-Minerals (CENTRUM PO) Take 1 tablet by mouth daily.      . pramipexole (MIRAPEX) 0.125 MG tablet Take 1-2 tablets by mouth every evening. 3 hours before bedtime  2  . flecainide (TAMBOCOR) 50 MG tablet Take 1 tablet (50 mg total) by mouth 2 (two) times daily. 180 tablet 3  . rivaroxaban (XARELTO) 20 MG TABS tablet Take 1 tablet (20 mg total) by mouth daily with supper. 30 tablet 3   No current facility-administered medications for this visit.    Allergies:   Review of patient's allergies indicates no known allergies.    Social History:  The patient  reports that she has never smoked. She has never used smokeless tobacco. She reports that she does not drink alcohol or use illicit drugs.   Family History:  The patient's family history includes Bradycardia in her father; Breast cancer in her mother; Colon  cancer in her mother; Diabetes in her maternal grandmother and paternal grandmother; Heart block in her father; Heart disease in her paternal uncle.    ROS:  Please see the history of present illness.   All other systems are reviewed and negative.    PHYSICAL EXAM: VS:  BP 110/52 mmHg  Pulse 54  Ht 5' 6.5" (1.689 m)  Wt 202 lb (91.627 kg)  BMI 32.12 kg/m2  SpO2 97% , BMI Body mass index is 32.12 kg/(m^2). GEN: Well nourished, well developed, in no acute distress HEENT: normal Neck: no JVD, carotid bruits, or masses Cardiac: RRR; no murmurs, rubs, or gallops,no edema  Respiratory:  clear to  auscultation bilaterally, normal work of breathing GI: soft, nontender, nondistended, + BS MS: no deformity or atrophy Skin: warm and dry, no rash Neuro:  Strength and sensation are intact Psych: euthymic mood, full affect  EKG:  EKG is ordered today. The ekg ordered today demonstrates sinus rhythm with PACs. Otherwise normal EKG unchanged from 2010.  Recent Labs: No results found for requested labs within last 365 days.    Lipid Panel No results found for: CHOL, TRIG, HDL, CHOLHDL, VLDL, LDLCALC, LDLDIRECT    Wt Readings from Last 3 Encounters:  05/12/15 202 lb (91.627 kg)  04/13/15 202 lb (91.627 kg)  08/22/11 196 lb 12.8 oz (89.268 kg)      Other studies Reviewed: Additional studies/ records that were reviewed today include: Holter monitor from 2010. Review of the above records demonstrates: As described in history of present illness   ASSESSMENT AND PLAN:  64 year old female  1. New diagnosis of paroxysmal atrial fibrillation  - we will d/c toprol, start flecainide 50 mg po BID  - GXT within 5-7 days - repeat echocardiogram, LVEF 60% and normal LA size in 2010 - start chronic anticoagulation with Xarelto 20 mg po daily, CHADS-VASc 3  2. Hypertension - controlled on current regimen  3. Hyperlipidemia - atorvastatin 20 mg daily, Lipids at goal   Follow-up in 3 months.  Signed, Dorothy Spark, MD  05/12/2015 11:41 AM    [facility], [place], [place]  [address] Phone: [phone]; Fax: [phone]

## 2015-05-20 ENCOUNTER — Ambulatory Visit (HOSPITAL_COMMUNITY)
Admission: RE | Admit: 2015-05-20 | Discharge: 2015-05-20 | Disposition: A | Discharge status: Home / Self Care | Payer: 59 | Source: Ambulatory Visit | Attending: Cardiology | Admitting: Cardiology

## 2015-05-20 ENCOUNTER — Ambulatory Visit (HOSPITAL_BASED_OUTPATIENT_CLINIC_OR_DEPARTMENT_OTHER): Payer: 59

## 2015-05-20 ENCOUNTER — Other Ambulatory Visit: Payer: Self-pay

## 2015-05-20 DIAGNOSIS — E785 Hyperlipidemia, unspecified: Secondary | ICD-10-CM | POA: Diagnosis not present

## 2015-05-20 DIAGNOSIS — I480 Paroxysmal atrial fibrillation: Secondary | ICD-10-CM

## 2015-05-20 DIAGNOSIS — I10 Essential (primary) hypertension: Secondary | ICD-10-CM | POA: Diagnosis not present

## 2015-05-20 DIAGNOSIS — E669 Obesity, unspecified: Secondary | ICD-10-CM | POA: Insufficient documentation

## 2015-05-20 LAB — EXERCISE TOLERANCE TEST
Estimated workload: 7 METS
Exercise duration (min): 5 min
Exercise duration (sec): 8 s
MPHR: 155 {beats}/min
Peak HR: 131 {beats}/min
Percent HR: 85 %
RPE: 17
Rest HR: 57 {beats}/min

## 2015-05-23 ENCOUNTER — Telehealth: Payer: Self-pay | Admitting: Cardiology

## 2015-05-23 NOTE — Telephone Encounter (Signed)
New message ° ° ° °Patient calling for echo test results °

## 2015-05-23 NOTE — Telephone Encounter (Signed)
Notes Recorded by Dorothy Spark, MD on 05/20/2015 at 5:40 PM Normal echocardiogram, mild MR, no treatment is needed for now  Informed patient of results and verbal understanding expressed.

## 2015-05-26 ENCOUNTER — Encounter: Payer: Self-pay | Admitting: Cardiology

## 2015-06-06 ENCOUNTER — Encounter: Payer: Self-pay | Admitting: Neurology

## 2015-06-06 ENCOUNTER — Ambulatory Visit (INDEPENDENT_AMBULATORY_CARE_PROVIDER_SITE_OTHER): Payer: 59 | Admitting: Neurology

## 2015-06-06 VITALS — BP 111/71 | HR 58 | Ht 66.5 in | Wt 201.0 lb

## 2015-06-06 DIAGNOSIS — I482 Chronic atrial fibrillation, unspecified: Secondary | ICD-10-CM

## 2015-06-06 DIAGNOSIS — R2689 Other abnormalities of gait and mobility: Secondary | ICD-10-CM

## 2015-06-06 DIAGNOSIS — R29818 Other symptoms and signs involving the nervous system: Secondary | ICD-10-CM | POA: Diagnosis not present

## 2015-06-06 NOTE — Progress Notes (Signed)
PATIENT: Jacqueline [name] DOB: Oct 27, 1951  Chief Complaint  Patient presents with  . Gait Difficulty    Reports having gait difficulty.  She frequently starts walking toward the right and will sometimes run into the wall.  She feels off balance and weak.  Feels her symptoms became worse after starting Tambocor.    HISTORICAL  Jacqueline [name] is a 64 year old right-handed female, seen in referral from her primary care physician Dr. Shelia Media for evaluation of him balance  I have reviewed most recent office visit in May 26 2015, she has  history of hypertension, hyperlipidemia, atrial fibrillation, on Xarelto treatment. She was newly diagnosed of Afib since May 2016 from cardiac monitoring, she complains of frequent palpitations. Since the diagnosis, metoprolol was stopped, she was started on flecainide and Xarelto.  Since April 2016, she noticed intermittent unsteady gait, tends to be a to her right side, it does not happen all the time, intermittent, she did not notice any triggers, transient, since her medication change, she seems to have more similar episode, sometimes lightheadedness sensation, she denies numbness, persistent weakness, no dysarthria, no headaches. She denies significant neck pain. No bowel and bladder incontinence.   REVIEW OF SYSTEMS: Full 14 system review of systems performed and notable only for fatigue, shortness of breath, weakness, dizziness, restless legs   ALLERGIES: No Known Allergies  HOME MEDICATIONS: Current Outpatient Prescriptions  Medication Sig Dispense Refill  . ACIPHEX 20 MG tablet Take 20 mg by mouth daily.    Marland Kitchen atorvastatin (LIPITOR) 20 MG tablet Take 10 mg by mouth daily.     . Calcium Carbonate (CALTRATE 600 PO) Take 1 tablet by mouth daily.      . cetirizine (ZYRTEC) 10 MG tablet Take 10 mg by mouth daily.      . Cranberry 405 MG CAPS Take by mouth daily.    . flecainide (TAMBOCOR) 50 MG tablet Take 1 tablet (50 mg total) by mouth 2 (two) times  daily. 180 tablet 3  . lisinopril-hydrochlorothiazide (PRINZIDE,ZESTORETIC) 20-12.5 MG per tablet Take 1 tablet by mouth daily.    . Multiple Vitamins-Minerals (CENTRUM PO) Take 1 tablet by mouth daily.      . pramipexole (MIRAPEX) 0.125 MG tablet Take 1-2 tablets by mouth every evening. 3 hours before bedtime  2  . rivaroxaban (XARELTO) 20 MG TABS tablet Take 1 tablet (20 mg total) by mouth daily with supper. 30 tablet 3     PAST MEDICAL HISTORY: Past Medical History  Diagnosis Date  . Stress incontinence, female   . HTN (hypertension)   . Hyperlipidemia   . GERD (gastroesophageal reflux disease)   . Palpitations   . Atrial fibrillation   . Gait abnormality     PAST SURGICAL HISTORY: Past Surgical History  Procedure Laterality Date  . Vaginal hysterectomy    . Anterior and posterior vaginal repair    . Burch procedure      FAMILY HISTORY: Family History  Problem Relation Age of Onset  . Heart block Father     probable  . Breast cancer Mother   . Colon cancer Mother   . Diabetes Paternal Grandmother   . Diabetes Maternal Grandmother   . Heart disease Paternal Uncle   . Bradycardia Father     SOCIAL HISTORY:  History   Social History  . Marital Status: Single    Spouse Name: N/A  . Number of Children: 2  . Years of Education: 16   Occupational History  .  Full time legal assistant    Social History Main Topics  . Smoking status: Never Smoker   . Smokeless tobacco: Never Used  . Alcohol Use: 0.0 oz/week    0 Standard drinks or equivalent per week     Comment: Occasional  . Drug Use: No  . Sexual Activity: Not on file   Other Topics Concern  . Not on file   Social History Narrative   Lives at home with husband.   2 biological children, 2 step-children.   Right-handed.   2-4 cups caffeine per day.     PHYSICAL EXAM   Filed Vitals:   06/06/15 0811  BP: 111/71  Pulse: 58  Height: 5' 6.5" (1.689 m)  Weight: 201 lb (91.173 kg)    Not recorded        Body mass index is 31.96 kg/(m^2).  PHYSICAL EXAMNIATION:  Gen: NAD, conversant, well nourised, obese, well groomed                     Cardiovascular: Regular rate rhythm, no peripheral edema, warm, nontender. Eyes: Conjunctivae clear without exudates or hemorrhage Neck: Supple, no carotid bruise. Pulmonary: Clear to auscultation bilaterally   NEUROLOGICAL EXAM:  MENTAL STATUS: Speech:    Speech is normal; fluent and spontaneous with normal comprehension.  Cognition:    The patient is oriented to person, place, and time;     recent and remote memory intact;     language fluent;     normal attention, concentration,     fund of knowledge.  CRANIAL NERVES: CN II: Visual fields are full to confrontation. Fundoscopic exam is normal with sharp discs and no vascular changes. pupils were equal round reactive to light.  CN III, IV, VI: extraocular movement are normal. No ptosis. CN V: Facial sensation is intact to pinprick in all 3 divisions bilaterally. Corneal responses are intact.  CN VII: Face is symmetric with normal eye closure and smile. CN VIII: Hearing is normal to rubbing fingers CN IX, X: Palate elevates symmetrically. Phonation is normal. CN XI: Head turning and shoulder shrug are intact CN XII: Tongue is midline with normal movements and no atrophy.  MOTOR: There is no pronator drift of out-stretched arms. Muscle bulk and tone are normal. Muscle strength is normal.  REFLEXES: Reflexes are 2+ and symmetric at the biceps, triceps, knees, and ankles. Plantar responses are flexor.  SENSORY: Light touch, pinprick, position sense, and vibration sense are intact in fingers and toes.  COORDINATION: Rapid alternating movements and fine finger movements are intact. There is no dysmetria on finger-to-nose and heel-knee-shin. There are no abnormal or extraneous movements.   GAIT/STANCE: Posture is normal. Gait is steady with normal steps, base, arm swing, and turning.  Heel and toe walking are normal. Tandem gait is normal.  Romberg is absent.   DIAGNOSTIC DATA (LABS, IMAGING, TESTING) - I reviewed patient records, labs, notes, testing and imaging myself where available.  No results found for: WBC, HGB, HCT, MCV, PLT No results found for: NA, K, CL, CO2, GLUCOSE, BUN, CREATININE, CALCIUM, PROT, ALBUMIN, AST, ALT, ALKPHOS, BILITOT, GFRNONAA, GFRAA No results found for: CHOL, HDL, LDLCALC, LDLDIRECT, TRIG, CHOLHDL No results found for: HGBA1C No results found for: VITAMINB12 Lab Results  Component Value Date   TSH 0.82 03/28/2009      ASSESSMENT AND PLAN  Jacqueline [name] is a 64 y.o. femalewith vascular risk factor of hypertension, hyperlipidemia, abnormal glucose, recent diagnosis of atrial fibrillation, on Xarelto treatment,  presented with intermittent mild unsteady gait, tends to the right side, essentially normal   examinations   1. Differentiation diagnosis includes deconditioning, presyncope, 2, her vascular risk factor has been addressed already, after discussed with patient, will hold off further evaluation, encouraged her continue moderate exercise, document or event, 3. Return to clinic in 3 months.   Marcial Pacas, M.D. Ph.D.  [facility] [address], [place], [place] [address] Ph: [phone] Fax: [phone]

## 2015-06-13 ENCOUNTER — Other Ambulatory Visit: Payer: Self-pay

## 2015-06-16 ENCOUNTER — Ambulatory Visit: Payer: 59 | Admitting: Cardiology

## 2015-07-21 ENCOUNTER — Encounter: Payer: Self-pay | Admitting: Cardiology

## 2015-07-21 ENCOUNTER — Ambulatory Visit (INDEPENDENT_AMBULATORY_CARE_PROVIDER_SITE_OTHER): Payer: 59 | Admitting: Cardiology

## 2015-07-21 VITALS — BP 118/58 | HR 87 | Ht 66.5 in | Wt 202.0 lb

## 2015-07-21 DIAGNOSIS — I480 Paroxysmal atrial fibrillation: Secondary | ICD-10-CM | POA: Insufficient documentation

## 2015-07-21 DIAGNOSIS — E785 Hyperlipidemia, unspecified: Secondary | ICD-10-CM | POA: Insufficient documentation

## 2015-07-21 DIAGNOSIS — I10 Essential (primary) hypertension: Secondary | ICD-10-CM | POA: Insufficient documentation

## 2015-07-21 MED ORDER — FLECAINIDE ACETATE 100 MG PO TABS: 100.0000 mg | ORAL_TABLET | Freq: Two times a day (BID) | ORAL | Status: DC

## 2015-07-21 NOTE — Progress Notes (Signed)
Patient ID: Jacqueline [name], female   DOB: [birth year], 64 y.o.   MRN: [id number]      Cardiology Office Note  Date:  07/21/2015   ID:  Jacqueline [name], DOB 1951/05/30, MRN [id number]  PCP:  Horatio Pel, MD  Cardiologist:   Dorothy Spark, MD   No chief complaint on file.  Chief complaint: Palpitations   History of Present Illness: Jacqueline [name] is a 64 y.o. female who presents for evaluation of palpitations. The patient is a very pleasant 64 year old female who was previously seen by Dr. Aundra Dubin in 2010, when she was experiencing palpitations at the time and Holter monitor showed occasional PACs and short run of 4 beats of SVT no longer runs. Her echocardiogram in time showed left ventricular ejection fraction of 60% with normal-appearing LV and no significant valvular abnormalities her right-sided pressures were normal with RVSP 28 mmHg. Patient was started on low-dose of Toprol-XL 25 mg daily and her symptoms improved significantly. She has been doing well for multiple years until recently when her symptoms became more frequent and longer. She describes palpitations that start all sudden terminate all sudden and can last up to half an hour and are not associated with any dizziness chest pain or presyncopal episode. She denies any chest pain or shortness of breath she is very active and otherwise asymptomatic. Lately her palpitations episodes have been happening about 3 times a week.  05/12/2015 - the patient was found to have a-fib on Holter monitor, she reports frequent palpitations lasting up to 30 minutes, no associated CP, dizziness or syncope.   07/21/2015 - the patient was diagnosed with PAF, several episodes on Holter, she was started on Flecainide 50 mg po BID with significant improvement of symptoms especially palpitations. She only feels occasional warm feeling associated with lightheadedness.  She has a GXT a week post Flecainide initiation and it was negative, repeat echo showed  normal LVEF 60% and normal LA size.   Past Medical History  Diagnosis Date  . Stress incontinence, female   . HTN (hypertension)   . Hyperlipidemia   . GERD (gastroesophageal reflux disease)   . Palpitations   . Atrial fibrillation   . Gait abnormality     Past Surgical History  Procedure Laterality Date  . Vaginal hysterectomy    . Anterior and posterior vaginal repair    . Burch procedure       Current Outpatient Prescriptions  Medication Sig Dispense Refill  . ACIPHEX 20 MG tablet Take 20 mg by mouth daily.    Marland Kitchen atorvastatin (LIPITOR) 20 MG tablet Take 10 mg by mouth daily.     . Calcium Carbonate (CALTRATE 600 PO) Take 600 mg by mouth daily.     . cetirizine (ZYRTEC) 10 MG tablet Take 10 mg by mouth daily.      . Cranberry 405 MG CAPS Take 405 mg by mouth daily.     . flecainide (TAMBOCOR) 50 MG tablet Take 1 tablet (50 mg total) by mouth 2 (two) times daily. 180 tablet 3  . lisinopril-hydrochlorothiazide (PRINZIDE,ZESTORETIC) 20-12.5 MG per tablet Take 1 tablet by mouth daily.    . Multiple Vitamins-Minerals (CENTRUM PO) Take 1 tablet by mouth daily.      . pramipexole (MIRAPEX) 0.125 MG tablet Take 1-2 tablets by mouth every evening. 3 hours before bedtime  2  . rivaroxaban (XARELTO) 20 MG TABS tablet Take 1 tablet (20 mg total) by mouth daily with supper. 30 tablet 3  No current facility-administered medications for this visit.   Allergies:   Review of patient's allergies indicates no known allergies.   Social History:  The patient  reports that she has never smoked. She has never used smokeless tobacco. She reports that she drinks alcohol. She reports that she does not use illicit drugs.   Family History:  The patient's family history includes Bradycardia in her father; Breast cancer in her mother; Colon cancer in her mother; Diabetes in her maternal grandmother and paternal grandmother; Heart block in her father; Heart disease in her paternal uncle.   ROS:  Please  see the history of present illness.   All other systems are reviewed and negative.   PHYSICAL EXAM: VS:  BP 118/58 mmHg  Pulse 87  Ht 5' 6.5" (1.689 m)  Wt 202 lb (91.627 kg)  BMI 32.12 kg/m2  SpO2 97% , BMI Body mass index is 32.12 kg/(m^2). GEN: Well nourished, well developed, in no acute distress HEENT: normal Neck: no JVD, carotid bruits, or masses Cardiac: RRR; no murmurs, rubs, or gallops,no edema  Respiratory:  clear to auscultation bilaterally, normal work of breathing GI: soft, nontender, nondistended, + BS MS: no deformity or atrophy Skin: warm and dry, no rash Neuro:  Strength and sensation are intact Psych: euthymic mood, full affect  EKG:  EKG is ordered today. The ekg ordered today demonstrates sinus rhythm with PACs. Otherwise normal EKG unchanged from 2010.  Recent Labs: No results found for requested labs within last 365 days.   Lipid Panel No results found for: CHOL, TRIG, HDL, CHOLHDL, VLDL, LDLCALC, LDLDIRECT   Wt Readings from Last 3 Encounters:  07/21/15 202 lb (91.627 kg)  06/06/15 201 lb (91.173 kg)  05/12/15 202 lb (91.627 kg)    ECHO: 05/20/2015 - Left ventricle: The cavity size was normal. Wall thickness was normal. Systolic function was normal. The estimated ejection fraction was in the range of 55% to 60%. - Mitral valve: There was mild regurgitation.  Other studies Reviewed: Additional studies/ records that were reviewed today include: Holter monitor from 2010. Review of the above records demonstrates: As described in history of present illness    ASSESSMENT AND PLAN:  64 year old female  1. New diagnosis of paroxysmal atrial fibrillation  - started on flecainide 50 mg po BID, today in the clinic we recorded an episode of a-fib that cardioverted to Sinus bradycardia with 3.6 seconds pauze. This is probably a culprit of her lightheaded episodes.  - we will increase flecainide to 100 mg po BID  - repeat ECG in 1 week - repeat Holter  monitor in 1 week.  - continue chronic anticoagulation with Xarelto 20 mg po daily, CHADS-VASc 3  2. Hypertension - controlled on current regimen  3. Hyperlipidemia - atorvastatin 20 mg daily, Lipids at goal  Follow-up in 3 months.  Signed, Dorothy Spark, MD  07/21/2015 9:19 AM    [facility] [address], [place], [place]  [address] Phone: [phone]; Fax: [phone]

## 2015-07-21 NOTE — Addendum Note (Signed)
Addended by: Nuala Alpha on: 07/21/2015 10:04 AM   Modules accepted: Orders, Medications

## 2015-07-21 NOTE — Patient Instructions (Signed)
Medication Instructions:   INCREASE YOUR FLECAINIDE TO 100 MG TWICE DAILY      Testing/Procedures:  COME INTO THE OFFICE IN ONE WEEK TO HAVE A 24 HOUR HOLTER MONITOR PLACED AND HAVE AN EKG/NURSE VISIT DONE THE SAME DAY TO CONFIRM IF YOUR STILL IF PAROXYMAL AFIB OR NOT  Your physician has recommended that you wear a holter monitor. Holter monitors are medical devices that record the heart's electrical activity. Doctors most often use these monitors to diagnose arrhythmias. Arrhythmias are problems with the speed or rhythm of the heartbeat. The monitor is a small, portable device. You can wear one while you do your normal daily activities. This is usually used to diagnose what is causing palpitations/syncope (passing out).     Follow-Up:   3 MONTHS WITH DR Meda Coffee

## 2015-07-28 ENCOUNTER — Ambulatory Visit (INDEPENDENT_AMBULATORY_CARE_PROVIDER_SITE_OTHER): Payer: 59

## 2015-07-28 VITALS — BP 120/70 | HR 55 | Resp 18 | Ht 66.0 in | Wt 202.0 lb

## 2015-07-28 DIAGNOSIS — I10 Essential (primary) hypertension: Secondary | ICD-10-CM

## 2015-07-28 DIAGNOSIS — I480 Paroxysmal atrial fibrillation: Secondary | ICD-10-CM

## 2015-07-28 DIAGNOSIS — E785 Hyperlipidemia, unspecified: Secondary | ICD-10-CM | POA: Diagnosis not present

## 2015-07-28 NOTE — Patient Instructions (Signed)
Medication Instructions:  Your physician recommends that you continue on your current medications as directed. Please refer to the Current Medication list given to you today.  Labwork: NONE  Testing/Procedures: NONE  Follow-Up: Your physician recommends that you schedule a follow-up appointment as directed in last office visit. 3 months with Dr. Meda Coffee.   Any Other Special Instructions Will Be Listed Below (If Applicable).

## 2015-07-28 NOTE — Progress Notes (Signed)
1.) Reason for visit: EKG  2.) Name of MD requesting visit: Dr. Meda Coffee  3.) H&P: Patient has history of palpitations with last EKG (07/21/15) showing A. Fib that cardioverted to Sinus bradycardia with 3.6 second pause. Dr. Meda Coffee increased Flecainide to 100 mg PO BID at that time.  4.) ROS related to problem: Patient's BP 120/70 and HR 55. EKG today showed Sinus Bradycardia with 1st degree AV block.   5.) Assessment and plan per MD: Dr. Meda Coffee reviewed EKG for today and is continuing with the plan to have patient wear 24 hour heart monitor. Patient having monitor placed today.

## 2015-08-02 ENCOUNTER — Telehealth: Payer: Self-pay | Admitting: Cardiology

## 2015-08-02 NOTE — Telephone Encounter (Signed)
New message     Pt wore heart monitor. Pt calling to see if we have results Please call to discuss

## 2015-08-02 NOTE — Telephone Encounter (Signed)
Left message for the pt to call back.  Informed on pts confirmed VM that her holter monitor results were sent to Dr Francesca Oman in-basket to review today, and she is out of the office until tomorrow.  Informed the pt that once she reviews her results and advises, I will follow-up with her thereafter.  Left message for the pt to call back today with any additional questions.

## 2015-08-04 ENCOUNTER — Other Ambulatory Visit (HOSPITAL_COMMUNITY): Payer: Self-pay | Admitting: *Deleted

## 2015-08-04 ENCOUNTER — Ambulatory Visit (HOSPITAL_COMMUNITY)
Admission: RE | Admit: 2015-08-04 | Discharge: 2015-08-04 | Disposition: A | Discharge status: Home / Self Care | Payer: 59 | Source: Ambulatory Visit | Attending: Nurse Practitioner | Admitting: Nurse Practitioner

## 2015-08-04 ENCOUNTER — Encounter (HOSPITAL_COMMUNITY): Payer: Self-pay | Admitting: Nurse Practitioner

## 2015-08-04 VITALS — BP 114/78 | HR 60 | Ht 66.0 in | Wt 203.4 lb

## 2015-08-04 DIAGNOSIS — I480 Paroxysmal atrial fibrillation: Secondary | ICD-10-CM

## 2015-08-04 DIAGNOSIS — E669 Obesity, unspecified: Secondary | ICD-10-CM | POA: Diagnosis not present

## 2015-08-04 DIAGNOSIS — K219 Gastro-esophageal reflux disease without esophagitis: Secondary | ICD-10-CM | POA: Diagnosis not present

## 2015-08-04 DIAGNOSIS — Z6832 Body mass index (BMI) 32.0-32.9, adult: Secondary | ICD-10-CM | POA: Insufficient documentation

## 2015-08-04 DIAGNOSIS — G4733 Obstructive sleep apnea (adult) (pediatric): Secondary | ICD-10-CM | POA: Diagnosis not present

## 2015-08-04 DIAGNOSIS — Z79899 Other long term (current) drug therapy: Secondary | ICD-10-CM | POA: Insufficient documentation

## 2015-08-04 DIAGNOSIS — I10 Essential (primary) hypertension: Secondary | ICD-10-CM | POA: Insufficient documentation

## 2015-08-04 DIAGNOSIS — E785 Hyperlipidemia, unspecified: Secondary | ICD-10-CM | POA: Diagnosis not present

## 2015-08-04 DIAGNOSIS — Z7902 Long term (current) use of antithrombotics/antiplatelets: Secondary | ICD-10-CM | POA: Insufficient documentation

## 2015-08-04 DIAGNOSIS — I4891 Unspecified atrial fibrillation: Secondary | ICD-10-CM | POA: Diagnosis present

## 2015-08-04 NOTE — Patient Instructions (Signed)
LINQ implant is scheduled for August 25th @ 70am with Dr. Rayann Heman.  Arrive at the [facility] @ 630am and go to admitting.  No special instructions for the procedure.  Follow up in device clinic on Sept 1st at 1130am.

## 2015-08-04 NOTE — Progress Notes (Signed)
Patient ID: Jacqueline [name], female   DOB: 30-Oct-1951, 64 y.o.   MRN: [id number]     Primary Care Physician: Horatio Pel, MD Referring Physician: Dr.Nelson   Jacqueline [name] is a 64 y.o. female with a h/o PAF that first diagnosed in April of this year but was treated for palpitaions in 2010 with metoprolol. She noticed over the last few months that episodes were of more frequency and longer duration, associated with mild shortness of breath and aware of irregular heartbeat, possibly 2-3 times a week lasting 10-20 mins. She was seen in consult by Dr. Meda Coffee, wore a holter monitor, which confirmed afib  and started on flecainide 50 mg bid, toprol stopped  and xarelto started  for a chadsvasc score of 3.She had a echo that showed normal EF, normal left atrial size. On f/u visit, she reported some lightheaded episodes, and had an ekg showing afib that converted to SR with a 3.6 termination pause  recorded in the office. Flecainide increased to 100 mg bid. Repeat holter showed frequent PVC's and non sustained ventricular tach up to 10 beats with profound sinus bradycardia and multiple pauses during awake hours, longest pause was 3.5 ms,episodes of atrial flutter. With this report, flecainide was stopped yesterday and pt is in the afib clinic today for further evaluation. She does report less dizziness since stopping flecainide yesterday am. She does have a tendency to have a slow heart beat today EKG showing SR at 60 bpm.  She denies any tobacco use, no alcohol use, uses cpap faithfully, minimal caffeine use. She does not have a regular exercise program and is overweight.  Today, she denies symptoms of palpitations, chest pain, shortness of breath, orthopnea, PND, lower extremity edema, dizziness, presyncope, syncope, or neurologic sequela. The patient is tolerating medications without difficulties and is otherwise without complaint today.   Past Medical History  Diagnosis Date  . Stress  incontinence, female   . HTN (hypertension)   . Hyperlipidemia   . GERD (gastroesophageal reflux disease)   . Palpitations   . Atrial fibrillation   . Gait abnormality    Past Surgical History  Procedure Laterality Date  . Vaginal hysterectomy    . Anterior and posterior vaginal repair    . Burch procedure      Current Outpatient Prescriptions  Medication Sig Dispense Refill  . ACIPHEX 20 MG tablet Take 20 mg by mouth daily.    Marland Kitchen atorvastatin (LIPITOR) 20 MG tablet Take 10 mg by mouth daily.     . Calcium Carbonate (CALTRATE 600 PO) Take 600 mg by mouth daily.     . cetirizine (ZYRTEC) 10 MG tablet Take 10 mg by mouth daily.      . Cranberry 405 MG CAPS Take 405 mg by mouth daily.     Marland Kitchen lisinopril-hydrochlorothiazide (PRINZIDE,ZESTORETIC) 20-12.5 MG per tablet Take 1 tablet by mouth daily.    . Multiple Vitamins-Minerals (CENTRUM PO) Take 1 tablet by mouth daily.      . pramipexole (MIRAPEX) 0.125 MG tablet Take 1-2 tablets by mouth every evening. 3 hours before bedtime  2  . rivaroxaban (XARELTO) 20 MG TABS tablet Take 1 tablet (20 mg total) by mouth daily with supper. 30 tablet 3   No current facility-administered medications for this encounter.    No Known Allergies  Social History   Social History  . Marital Status: Single    Spouse Name: N/A  . Number of Children: 2  . Years of Education: 61  Occupational History  . Full time legal assistant    Social History Main Topics  . Smoking status: Never Smoker   . Smokeless tobacco: Never Used  . Alcohol Use: 0.0 oz/week    0 Standard drinks or equivalent per week     Comment: Occasional  . Drug Use: No  . Sexual Activity: Not on file   Other Topics Concern  . Not on file   Social History Narrative   Lives at home with husband.   2 biological children, 2 step-children.   Right-handed.   2-4 cups caffeine per day.    Family History  Problem Relation Age of Onset  . Heart block Father     probable  .  Breast cancer Mother   . Colon cancer Mother   . Diabetes Paternal Grandmother   . Diabetes Maternal Grandmother   . Heart disease Paternal Uncle   . Bradycardia Father     ROS- All systems are reviewed and negative except as per the HPI above  Physical Exam: Filed Vitals:   08/04/15 0851  BP: 114/78  Pulse: 60  Height: 5\' 6"  (1.676 m)  Weight: 203 lb 6.4 oz (92.262 kg)    GEN- The patient is well appearing, alert and oriented x 3 today.   Head- normocephalic, atraumatic Eyes-  Sclera clear, conjunctiva pink Ears- hearing intact .meOropharynx- clear Neck- supple, no JVP Lymph- no cervical lymphadenopathy Lungs- Clear to ausculation bilaterally, normal work of breathing Heart- Regular rate and rhythm, no murmurs, rubs or gallops, PMI not laterally displaced GI- soft, NT, ND, + BS Extremities- no clubbing, cyanosis, or edema MS- no significant deformity or atrophy Skin- no rash or lesion Psych- euthymic mood, full affect Neuro- strength and sensation are intact  EKG- NSR, Normal EKG, at 60 bpm, Pr int 186 ms, QRS 90 ms, Qtc 442 ms Epic records reviewed EchoLeft ventricle: The cavity size was normal. Wall thickness was normal. Systolic function was normal. The estimated ejection fraction was in the range of 55% to 60%. - Mitral valve: There was mild regurgitation.  Assessment and Plan:  1. PAF with termination pauses and tendency to bradycardia Pt is now off flecainide x 24 hours and is in SR Discussed options going forward Would like to know more about rhythm off AAD prior to starting another one. Discussed placing a monitor again after wash out of flecainide but pt is growing tired wearing external  monitors Discussed placing a ILR to look for tachy brady syndrome as well as overall afib burden, termination pauses and she is in agreement. This will be placed in one week with Dr. Rayann Heman Will be seen back in the afib clinic in one month when I should have additional  data from ILR to help guide treatment of afib going forward.  2. OSA  Continue cpap  3. Obesity Encouraged regular exercise and weight loss  MCH dietary class encouraged  4.HTN Stable  Betrice Wanat C. Law Corsino, [facility] [address] [place], [place] [address] [phone]

## 2015-08-11 ENCOUNTER — Encounter (HOSPITAL_COMMUNITY)
Admission: RE | Disposition: A | Discharge status: Home / Self Care | Payer: Self-pay | Source: Ambulatory Visit | Attending: Internal Medicine

## 2015-08-11 ENCOUNTER — Ambulatory Visit (HOSPITAL_COMMUNITY)
Admission: RE | Admit: 2015-08-11 | Discharge: 2015-08-11 | Disposition: A | Discharge status: Home / Self Care | Payer: 59 | Source: Ambulatory Visit | Attending: Internal Medicine | Admitting: Internal Medicine

## 2015-08-11 ENCOUNTER — Encounter (HOSPITAL_COMMUNITY): Payer: Self-pay | Admitting: Internal Medicine

## 2015-08-11 DIAGNOSIS — I495 Sick sinus syndrome: Secondary | ICD-10-CM | POA: Insufficient documentation

## 2015-08-11 DIAGNOSIS — Z7901 Long term (current) use of anticoagulants: Secondary | ICD-10-CM | POA: Diagnosis not present

## 2015-08-11 DIAGNOSIS — E669 Obesity, unspecified: Secondary | ICD-10-CM | POA: Diagnosis not present

## 2015-08-11 DIAGNOSIS — G4733 Obstructive sleep apnea (adult) (pediatric): Secondary | ICD-10-CM | POA: Insufficient documentation

## 2015-08-11 DIAGNOSIS — E785 Hyperlipidemia, unspecified: Secondary | ICD-10-CM | POA: Diagnosis not present

## 2015-08-11 DIAGNOSIS — K219 Gastro-esophageal reflux disease without esophagitis: Secondary | ICD-10-CM | POA: Diagnosis not present

## 2015-08-11 DIAGNOSIS — I4891 Unspecified atrial fibrillation: Secondary | ICD-10-CM | POA: Diagnosis present

## 2015-08-11 DIAGNOSIS — I10 Essential (primary) hypertension: Secondary | ICD-10-CM | POA: Diagnosis not present

## 2015-08-11 HISTORY — PX: EP IMPLANTABLE DEVICE: SHX172B

## 2015-08-11 SURGERY — LOOP RECORDER INSERTION

## 2015-08-11 MED ORDER — LIDOCAINE-EPINEPHRINE 1 %-1:100000 IJ SOLN
INTRAMUSCULAR | Status: DC | PRN
  Administered 2015-08-11: 10 mL

## 2015-08-11 SURGICAL SUPPLY — 2 items
LOOP REVEAL LINQSYS (Prosthesis & Implant Heart) ×1 IMPLANT
PACK LOOP INSERTION (CUSTOM PROCEDURE TRAY) ×2 IMPLANT

## 2015-08-11 NOTE — H&P (View-Only) (Signed)
Patient ID: Jacqueline [name], female   DOB: 12-Jun-1951, 64 y.o.   MRN: [id number]     Primary Care Physician: Jacqueline Pel, MD Referring Physician: Dr.Nelson   Jacqueline [name] is a 64 y.o. female with a h/o PAF that first diagnosed in April of this year but was treated for palpitaions in 2010 with metoprolol. She noticed over the last few months that episodes were of more frequency and longer duration, associated with mild shortness of breath and aware of irregular heartbeat, possibly 2-3 times a week lasting 10-20 mins. She was seen in consult by Dr. Meda [name], wore a holter monitor, which confirmed afib  and started on flecainide 50 mg bid, toprol stopped  and xarelto started  for a chadsvasc score of 3.She had a echo that showed normal EF, normal left atrial size. On f/u visit, she reported some lightheaded episodes, and had an ekg showing afib that converted to SR with a 3.6 termination pause  recorded in the office. Flecainide increased to 100 mg bid. Repeat holter showed frequent PVC's and non sustained ventricular tach up to 10 beats with profound sinus bradycardia and multiple pauses during awake hours, longest pause was 3.5 ms,episodes of atrial flutter. With this report, flecainide was stopped yesterday and pt is in the afib clinic today for further evaluation. She does report less dizziness since stopping flecainide yesterday am. She does have a tendency to have a slow heart beat today EKG showing SR at 60 bpm.  She denies any tobacco use, no alcohol use, uses cpap faithfully, minimal caffeine use. She does not have a regular exercise program and is overweight.  Today, she denies symptoms of palpitations, chest pain, shortness of breath, orthopnea, PND, lower extremity edema, dizziness, presyncope, syncope, or neurologic sequela. The patient is tolerating medications without difficulties and is otherwise without complaint today.   Past Medical History  Diagnosis Date  . Stress  incontinence, female   . HTN (hypertension)   . Hyperlipidemia   . GERD (gastroesophageal reflux disease)   . Palpitations   . Atrial fibrillation   . Gait abnormality    Past Surgical History  Procedure Laterality Date  . Vaginal hysterectomy    . Anterior and posterior vaginal repair    . Burch procedure      Current Outpatient Prescriptions  Medication Sig Dispense Refill  . ACIPHEX 20 MG tablet Take 20 mg by mouth daily.    Marland Kitchen atorvastatin (LIPITOR) 20 MG tablet Take 10 mg by mouth daily.     . Calcium Carbonate (CALTRATE 600 PO) Take 600 mg by mouth daily.     . cetirizine (ZYRTEC) 10 MG tablet Take 10 mg by mouth daily.      . Cranberry 405 MG CAPS Take 405 mg by mouth daily.     Marland Kitchen lisinopril-hydrochlorothiazide (PRINZIDE,ZESTORETIC) 20-12.5 MG per tablet Take 1 tablet by mouth daily.    . Multiple Vitamins-Minerals (CENTRUM PO) Take 1 tablet by mouth daily.      . pramipexole (MIRAPEX) 0.125 MG tablet Take 1-2 tablets by mouth every evening. 3 hours before bedtime  2  . rivaroxaban (XARELTO) 20 MG TABS tablet Take 1 tablet (20 mg total) by mouth daily with supper. 30 tablet 3   No current facility-administered medications for this encounter.    No Known Allergies  Social History   Social History  . Marital Status: Single    Spouse Name: N/A  . Number of Children: 2  . Years of Education: 73  Occupational History  . Full time legal assistant    Social History Main Topics  . Smoking status: Never Smoker   . Smokeless tobacco: Never Used  . Alcohol Use: 0.0 oz/week    0 Standard drinks or equivalent per week     Comment: Occasional  . Drug Use: No  . Sexual Activity: Not on file   Other Topics Concern  . Not on file   Social History Narrative   Lives at home with husband.   2 biological children, 2 step-children.   Right-handed.   2-4 cups caffeine per day.    Family History  Problem Relation Age of Onset  . Heart block Father     probable  .  Breast cancer Mother   . Colon cancer Mother   . Diabetes Paternal Grandmother   . Diabetes Maternal Grandmother   . Heart disease Paternal Uncle   . Bradycardia Father     ROS- All systems are reviewed and negative except as per the HPI above  Physical Exam: Filed Vitals:   08/04/15 0851  BP: 114/78  Pulse: 60  Height: 5\' 6"  (1.676 m)  Weight: 203 lb 6.4 oz (92.262 kg)    GEN- The patient is well appearing, alert and oriented x 3 today.   Head- normocephalic, atraumatic Eyes-  Sclera clear, conjunctiva pink Ears- hearing intact .meOropharynx- clear Neck- supple, no JVP Lymph- no cervical lymphadenopathy Lungs- Clear to ausculation bilaterally, normal work of breathing Heart- Regular rate and rhythm, no murmurs, rubs or gallops, PMI not laterally displaced GI- soft, NT, ND, + BS Extremities- no clubbing, cyanosis, or edema MS- no significant deformity or atrophy Skin- no rash or lesion Psych- euthymic mood, full affect Neuro- strength and sensation are intact  EKG- NSR, Normal EKG, at 60 bpm, Pr int 186 ms, QRS 90 ms, Qtc 442 ms Epic records reviewed EchoLeft ventricle: The cavity size was normal. Wall thickness was normal. Systolic function was normal. The estimated ejection fraction was in the range of 55% to 60%. - Mitral valve: There was mild regurgitation.  Assessment and Plan:  1. PAF with termination pauses and tendency to bradycardia Pt is now off flecainide x 24 hours and is in SR Discussed options going forward Would like to know more about rhythm off AAD prior to starting another one. Discussed placing a monitor again after wash out of flecainide but pt is growing tired wearing external  monitors Discussed placing a ILR to look for tachy brady syndrome as well as overall afib burden, termination pauses and she is in agreement. This will be placed in one week with Dr. Rayann [name] Will be seen back in the afib clinic in one month when I should have additional  data from ILR to help guide treatment of afib going forward.  2. OSA  Continue cpap  3. Obesity Encouraged regular exercise and weight loss  MCH dietary class encouraged  4.HTN Stable  Jacqueline Mcdonnell C. Jacqueline [name], [facility] [address] [place], [place] [address] [phone]

## 2015-08-11 NOTE — Discharge Instructions (Signed)
Loop recorder discharge instructions given to pt and pt's husband who verbalize understanding.

## 2015-08-11 NOTE — Interval H&P Note (Signed)
History and Physical Interval Note:  08/11/2015 7:10 AM  Jacqueline [name]  has presented today for surgery, with the diagnosis of afib  The various methods of treatment have been discussed with the patient and family. After consideration of risks, benefits and other options for treatment, the patient has consented to  Procedure(s): Loop Recorder Insertion (N/A) as a surgical intervention .  The patient's history has been reviewed, patient examined, no change in status, stable for surgery.  I have reviewed the patient's chart and labs.  Questions were answered to the patient's satisfaction.     [name]

## 2015-08-18 ENCOUNTER — Ambulatory Visit (INDEPENDENT_AMBULATORY_CARE_PROVIDER_SITE_OTHER): Payer: 59 | Admitting: *Deleted

## 2015-08-18 DIAGNOSIS — I480 Paroxysmal atrial fibrillation: Secondary | ICD-10-CM

## 2015-08-18 LAB — CUP PACEART INCLINIC DEVICE CHECK
Date Time Interrogation Session: 20160901114806
Zone Setting Detection Interval: 2000 ms
Zone Setting Detection Interval: 3000 ms
Zone Setting Detection Interval: 370 ms

## 2015-08-18 NOTE — Progress Notes (Signed)
Loop check in clinic. Wound edges approximated, no redness swelling or drainage. Pt educated about wound care. Battery- good. R waves 0.20mV. Pt with 1 AF episode, pk V 150 +xarelto. Follow up in AFib Clinic 09-13-15.

## 2015-08-30 ENCOUNTER — Encounter: Payer: Self-pay | Admitting: *Deleted

## 2015-08-30 ENCOUNTER — Telehealth: Payer: Self-pay | Admitting: Neurology

## 2015-08-30 NOTE — Telephone Encounter (Signed)
Spoke to patient - says she went back to the cardiologist and was placed on a 24 hour heart monitor.  Due the results, Flecainide was discontinued and her symptoms have resolved.

## 2015-08-30 NOTE — Telephone Encounter (Signed)
Pt called to let Dr. Krista Blue know that her symptoms were caused by her heart medication.

## 2015-09-01 ENCOUNTER — Encounter: Payer: Self-pay | Admitting: Internal Medicine

## 2015-09-06 ENCOUNTER — Ambulatory Visit: Payer: Self-pay | Admitting: Neurology

## 2015-09-09 ENCOUNTER — Ambulatory Visit (INDEPENDENT_AMBULATORY_CARE_PROVIDER_SITE_OTHER): Payer: 59 | Admitting: *Deleted

## 2015-09-09 ENCOUNTER — Telehealth: Payer: Self-pay | Admitting: *Deleted

## 2015-09-09 ENCOUNTER — Encounter: Payer: Self-pay | Admitting: Internal Medicine

## 2015-09-09 DIAGNOSIS — I480 Paroxysmal atrial fibrillation: Secondary | ICD-10-CM

## 2015-09-09 NOTE — Telephone Encounter (Signed)
[facility] requesting call back.  Need to know patient's symptoms during symptom episode on 09/02/15.

## 2015-09-12 NOTE — Telephone Encounter (Signed)
Patient returned phone call.  She states that on 9/16 she felt her heart "go out of rhythm" and she used her symptom activator to mark the episode.  She states that she has overall been feeling "alright" and that her A-fib "comes and goes".  She states she is taking her Xarelto as prescribed.  Confirmed patient's appointment with Roderic Palau, NP on 09/13/15 at 9:00am.  Patient aware to call with worsening symptoms, questions, or concerns.

## 2015-09-13 ENCOUNTER — Ambulatory Visit (HOSPITAL_COMMUNITY)
Admission: RE | Admit: 2015-09-13 | Discharge: 2015-09-13 | Disposition: A | Discharge status: Home / Self Care | Payer: 59 | Source: Ambulatory Visit | Attending: Nurse Practitioner | Admitting: Nurse Practitioner

## 2015-09-13 ENCOUNTER — Encounter (HOSPITAL_COMMUNITY): Payer: Self-pay | Admitting: Nurse Practitioner

## 2015-09-13 VITALS — BP 118/80 | HR 52 | Ht 66.0 in | Wt 203.0 lb

## 2015-09-13 DIAGNOSIS — G4733 Obstructive sleep apnea (adult) (pediatric): Secondary | ICD-10-CM | POA: Insufficient documentation

## 2015-09-13 DIAGNOSIS — I10 Essential (primary) hypertension: Secondary | ICD-10-CM | POA: Diagnosis not present

## 2015-09-13 DIAGNOSIS — E669 Obesity, unspecified: Secondary | ICD-10-CM | POA: Diagnosis not present

## 2015-09-13 DIAGNOSIS — I480 Paroxysmal atrial fibrillation: Secondary | ICD-10-CM | POA: Insufficient documentation

## 2015-09-13 MED ORDER — DILTIAZEM HCL 30 MG PO TABS: ORAL_TABLET | ORAL | Status: DC

## 2015-09-13 NOTE — Progress Notes (Signed)
Patient ID: Jacqueline [name], female   DOB: 04-12-1951, 64 y.o.   MRN: [id number] Pa    Primary Care Physician: Horatio Pel, MD Referring Physician: Dr.Nelson   Jacqueline [name] is a 64 y.o. female with a h/o PAF that first diagnosed in April of this year but was treated for palpitaions in 2010 with metoprolol. She noticed over the last few months that episodes were of more frequency and longer duration, associated with mild shortness of breath and aware of irregular heartbeat, possibly 2-3 times a week lasting 10-20 mins. She was seen in consult by Dr. Meda Coffee, wore a holter monitor, which confirmed afib  and started on flecainide 50 mg bid, toprol stopped  and xarelto started  for a chadsvasc score of 3.She had a echo that showed normal EF, normal left atrial size. On f/u visit, she reported some lightheaded episodes, and had an ekg showing afib that converted to SR with a 3.6 termination pause  recorded in the office. Flecainide increased to 100 mg bid. Repeat holter showed frequent PVC's and non sustained ventricular tach up to 10 beats with profound sinus bradycardia and multiple pauses during awake hours, longest pause was 3.5 ms,episodes of atrial flutter. With this report, flecainide was stopped yesterday and pt is in the afib clinic today for further evaluation. She does report less dizziness since stopping flecainide yesterday am. She does have a tendency to have a slow heart beat today EKG showing SR at 60 bpm.  She was scheduled for a ILR, and was implanted 8/25. She has noticed some afib episodes but have been of short duration and not that symptomatic for her. Linq report summary reviewed with pt. She does have bradycardia at baseline which may be an issue when other AAD's are discussed. No pauses or other arrhythmia noted.  She denies any tobacco use, no alcohol use, uses cpap faithfully, minimal caffeine use. She does not have a regular exercise program and is overweight.  Today, she  denies symptoms of palpitations, chest pain, shortness of breath, orthopnea, PND, lower extremity edema, dizziness, presyncope, syncope, or neurologic sequela. The patient is tolerating medications without difficulties and is otherwise without complaint today.   Past Medical History  Diagnosis Date  . Stress incontinence, female   . HTN (hypertension)   . Hyperlipidemia   . GERD (gastroesophageal reflux disease)   . Palpitations   . Atrial fibrillation   . Gait abnormality    Past Surgical History  Procedure Laterality Date  . Vaginal hysterectomy    . Anterior and posterior vaginal repair    . Burch procedure    . Ep implantable device N/A 08/11/2015    Procedure: Loop Recorder Insertion;  Surgeon: [name], MD;  Location: [place] [facility];  Service: Cardiovascular;  Laterality: N/A;    Current Outpatient Prescriptions  Medication Sig Dispense Refill  . acetaminophen (TYLENOL) 500 MG tablet Take 500 mg by mouth every 8 (eight) hours as needed for mild pain or moderate pain.    Jacqueline [name] ACIPHEX 20 MG tablet Take 20 mg by mouth daily.    Jacqueline [name] atorvastatin (LIPITOR) 20 MG tablet Take 10 mg by mouth daily.     . Calcium Carbonate (CALTRATE 600 PO) Take 600 mg by mouth daily.     . cetirizine (ZYRTEC) 10 MG tablet Take 10 mg by mouth daily.      . Cranberry 405 MG CAPS Take 405 mg by mouth daily.     . fluticasone (FLONASE) 50 MCG/ACT  nasal spray Place 1 spray into both nostrils daily as needed for allergies or rhinitis.    Jacqueline [name] lisinopril-hydrochlorothiazide (PRINZIDE,ZESTORETIC) 20-12.5 MG per tablet Take 1 tablet by mouth daily.    . Multiple Vitamins-Minerals (CENTRUM PO) Take 1 tablet by mouth daily.      . pramipexole (MIRAPEX) 0.125 MG tablet Take 1-2 tablets by mouth every evening. 3 hours before bedtime  2  . rivaroxaban (XARELTO) 20 MG TABS tablet Take 1 tablet (20 mg total) by mouth daily with supper. 30 tablet 3  . diltiazem (CARDIZEM) 30 MG tablet Take 1 tablet every 4 hours AS  NEEDED for HR >100 as long BP>100 30 tablet 1   No current facility-administered medications for this encounter.    No Known Allergies  Social History   Social History  . Marital Status: Married    Spouse Name: N/A  . Number of Children: 2  . Years of Education: 16   Occupational History  . Full time legal assistant    Social History Main Topics  . Smoking status: Never Smoker   . Smokeless tobacco: Never Used  . Alcohol Use: 0.0 oz/week    0 Standard drinks or equivalent per week     Comment: Occasional  . Drug Use: No  . Sexual Activity: Not on file   Other Topics Concern  . Not on file   Social History Narrative   Lives at home with husband.   2 biological children, 2 step-children.   Right-handed.   2-4 cups caffeine per day.    Family History  Problem Relation Age of Onset  . Heart block Father     probable  . Breast cancer Mother   . Colon cancer Mother   . Diabetes Paternal Grandmother   . Diabetes Maternal Grandmother   . Heart disease Paternal Uncle   . Bradycardia Father     ROS- All systems are reviewed and negative except as per the HPI above  Physical Exam: Filed Vitals:   09/13/15 0903  BP: 118/80  Pulse: 52  Height: 5\' 6"  (1.676 m)  Weight: 203 lb (92.08 kg)    GEN- The patient is well appearing, alert and oriented x 3 today.   Head- normocephalic, atraumatic Eyes-  Sclera clear, conjunctiva pink Ears- hearing intact .meOropharynx- clear Neck- supple, no JVP Lymph- no cervical lymphadenopathy Lungs- Clear to ausculation bilaterally, normal work of breathing Heart- Regular rate and rhythm, no murmurs, rubs or gallops, PMI not laterally displaced GI- soft, NT, ND, + BS Extremities- no clubbing, cyanosis, or edema MS- no significant deformity or atrophy Skin- no rash or lesion Psych- euthymic mood, full affect Neuro- strength and sensation are intact  EKG- Sinus brady  At 52 bpm, Pr int 168 ms, QRS 86 ms, QTc 409 ms.  ILR  interrogation reviewed and shows several short, less than 30 min episode of afib.  EchoLeft ventricle: The cavity size was normal. Wall thickness was normal. Systolic function was normal. The estimated ejection fraction was in the range of 55% to 60%. - Mitral valve: There was mild regurgitation.  Assessment and Plan:  1. PAF with termination pauses and tendency to bradycardia Pt is off flecainide/metoprolol Feeling better Tendency toward bradycardia Will f/u in one month to further review afib burden with full interogation and make decisions how to proceed    2. OSA  Continue cpap  3. Obesity Encouraged regular exercise and weight loss  MCH dietary class encouraged  4.HTN Stable  Donna C. Kayleen Memos,  [facility] [address] [place], [place] [address] [phone]

## 2015-09-13 NOTE — Patient Instructions (Signed)
Your physician has recommended you make the following change in your medication:  1)Cardizem 30mg  -- take 1 tablet every 4 hours AS NEEDED for heart rate >100 as long as BP >100  Parking code for oct 0900

## 2015-09-15 NOTE — Progress Notes (Signed)
Loop recorder 

## 2015-09-19 ENCOUNTER — Encounter: Payer: Self-pay | Admitting: Internal Medicine

## 2015-09-22 ENCOUNTER — Encounter: Payer: Self-pay | Admitting: Internal Medicine

## 2015-10-09 ENCOUNTER — Other Ambulatory Visit: Payer: Self-pay | Admitting: Cardiology

## 2015-10-10 ENCOUNTER — Ambulatory Visit (INDEPENDENT_AMBULATORY_CARE_PROVIDER_SITE_OTHER): Payer: 59 | Admitting: *Deleted

## 2015-10-10 DIAGNOSIS — I480 Paroxysmal atrial fibrillation: Secondary | ICD-10-CM | POA: Diagnosis not present

## 2015-10-10 LAB — CUP PACEART REMOTE DEVICE CHECK: Date Time Interrogation Session: 20160924110527

## 2015-10-10 NOTE — Progress Notes (Signed)
[facility] summary report received. Battery status OK. Normal device function. No new pause or brady episodes. 71 AF episodes (burden 0.6%), +Xarelto, avg V rate controlled. 1 symptom episode, patient reports feeling heart go out of rhythm. 5 tachy episodes, ECGs suggest RVR vs. SVT, longest 13min 5sec, peak V 171bpm. Monthly summary reports and ROV with AF clinic on 09/13/15 at 9:00am.

## 2015-10-11 NOTE — Progress Notes (Signed)
Loop recorder 

## 2015-10-17 ENCOUNTER — Ambulatory Visit (HOSPITAL_COMMUNITY)
Admission: RE | Admit: 2015-10-17 | Discharge: 2015-10-17 | Disposition: A | Discharge status: Home / Self Care | Payer: 59 | Source: Ambulatory Visit | Attending: Nurse Practitioner | Admitting: Nurse Practitioner

## 2015-10-17 VITALS — BP 124/76 | HR 55 | Ht 66.0 in | Wt 204.8 lb

## 2015-10-17 DIAGNOSIS — G4733 Obstructive sleep apnea (adult) (pediatric): Secondary | ICD-10-CM | POA: Insufficient documentation

## 2015-10-17 DIAGNOSIS — I480 Paroxysmal atrial fibrillation: Secondary | ICD-10-CM | POA: Diagnosis not present

## 2015-10-17 DIAGNOSIS — I10 Essential (primary) hypertension: Secondary | ICD-10-CM | POA: Insufficient documentation

## 2015-10-17 DIAGNOSIS — E669 Obesity, unspecified: Secondary | ICD-10-CM | POA: Diagnosis not present

## 2015-10-17 NOTE — Progress Notes (Signed)
Patient ID: Jacqueline [name], female   DOB: 16-Sep-1951, 64 y.o.   MRN: [id number]         Primary Care Physician: Horatio Pel, MD Referring Physician: Dr.Nelson   Jacqueline [name] is a 64 y.o. female with a h/o PAF that first diagnosed in April of this year but was treated for palpitaions in 2010 with metoprolol. She noticed over the last few months that episodes were of more frequency and longer duration, associated with mild shortness of breath and aware of irregular heartbeat, possibly 2-3 times a week lasting 10-20 mins. She was seen in consult by Dr. Meda Coffee, wore a holter monitor, which confirmed afib  and started on flecainide 50 mg bid, toprol stopped  and xarelto started  for a chadsvasc score of 3.She had a echo that showed normal EF, normal left atrial size. On f/u visit, she reported some lightheaded episodes, and had an ekg showing afib that converted to SR with a 3.6 termination pause  recorded in the office. Flecainide increased to 100 mg bid. Repeat holter showed frequent PVC's and non sustained ventricular tach up to 10 beats with profound sinus bradycardia and multiple pauses during awake hours, longest pause was 3.5 ms,episodes of atrial flutter. With this report, flecainide was stopped  and pt referred to afib clinic  for further evaluation. She does report less dizziness since stopping flecainide yesterday am. She does have a tendency to have a slow heart beat today EKG showing SR at 60 bpm.  She was scheduled for a ILR, and was implanted 8/25. She has noticed some afib episodes but have been of short duration and not that symptomatic for her. Linq report summary reviewed with pt. She does have bradycardia at baseline which may be an issue when other AAD's are discussed. No pauses or other arrhythmia noted.  She denies any tobacco use, no alcohol use, uses cpap faithfully, minimal caffeine use. She does not have a regular exercise program and is overweight.  On return visit  to afib clinic, she reports ongoing issues with afib. Linq is interrogated today and shows afib burden of 8%, with afib episodes almost daily but of short duration of 2-4 minutes, up to 54 mins of longest duration with v rates of 140-170. Pt currently is not on any rate/rhythm control due to bradycardia at baseline and she does show continuation of some termination pauses, but appear around 1 sec long.  Today, she denies symptoms of palpitations, chest pain, shortness of breath, orthopnea, PND, lower extremity edema, dizziness, presyncope, syncope, or neurologic sequela. The patient is tolerating medications without difficulties and is otherwise without complaint today.   Past Medical History  Diagnosis Date  . Stress incontinence, female   . HTN (hypertension)   . Hyperlipidemia   . GERD (gastroesophageal reflux disease)   . Palpitations   . Atrial fibrillation   . Gait abnormality    Past Surgical History  Procedure Laterality Date  . Vaginal hysterectomy    . Anterior and posterior vaginal repair    . Burch procedure    . Ep implantable device N/A 08/11/2015    Procedure: Loop Recorder Insertion;  Surgeon: [name], MD;  Location: [facility];  Service: Cardiovascular;  Laterality: N/A;    Current Outpatient Prescriptions  Medication Sig Dispense Refill  . acetaminophen (TYLENOL) 500 MG tablet Take 500 mg by mouth every 8 (eight) hours as needed for mild pain or moderate pain.    Marland Kitchen ACIPHEX 20 MG tablet  Take 20 mg by mouth daily.    Marland Kitchen atorvastatin (LIPITOR) 20 MG tablet Take 10 mg by mouth daily.     . Calcium Carbonate (CALTRATE 600 PO) Take 600 mg by mouth daily.     . cetirizine (ZYRTEC) 10 MG tablet Take 10 mg by mouth daily.      . Cranberry 405 MG CAPS Take 405 mg by mouth daily.     . fluticasone (FLONASE) 50 MCG/ACT nasal spray Place 1 spray into both nostrils daily as needed for allergies or rhinitis.    Marland Kitchen lisinopril-hydrochlorothiazide (PRINZIDE,ZESTORETIC)  20-12.5 MG per tablet Take 1 tablet by mouth daily.    . Multiple Vitamins-Minerals (CENTRUM PO) Take 1 tablet by mouth daily.      . pramipexole (MIRAPEX) 0.125 MG tablet Take 1-2 tablets by mouth every evening. 3 hours before bedtime  2  . XARELTO 20 MG TABS tablet TAKE 1 TABLET (20 MG TOTAL) BY MOUTH DAILY WITH SUPPER. 30 tablet 3  . diltiazem (CARDIZEM) 30 MG tablet Take 1 tablet every 4 hours AS NEEDED for HR >100 as long BP>100 (Patient not taking: Reported on 10/17/2015) 30 tablet 1   No current facility-administered medications for this encounter.    No Known Allergies  Social History   Social History  . Marital Status: Married    Spouse Name: N/A  . Number of Children: 2  . Years of Education: 16   Occupational History  . Full time legal assistant    Social History Main Topics  . Smoking status: Never Smoker   . Smokeless tobacco: Never Used  . Alcohol Use: 0.0 oz/week    0 Standard drinks or equivalent per week     Comment: Occasional  . Drug Use: No  . Sexual Activity: Not on file   Other Topics Concern  . Not on file   Social History Narrative   Lives at home with husband.   2 biological children, 2 step-children.   Right-handed.   2-4 cups caffeine per day.    Family History  Problem Relation Age of Onset  . Heart block Father     probable  . Breast cancer Mother   . Colon cancer Mother   . Diabetes Paternal Grandmother   . Diabetes Maternal Grandmother   . Heart disease Paternal Uncle   . Bradycardia Father     ROS- All systems are reviewed and negative except as per the HPI above  Physical Exam: Filed Vitals:   10/17/15 0855  BP: 124/76  Pulse: 55  Height: 5\' 6"  (1.676 m)  Weight: 204 lb 12.8 oz (92.897 kg)    GEN- The patient is well appearing, alert and oriented x 3 today.   Head- normocephalic, atraumatic Eyes-  Sclera clear, conjunctiva pink Ears- hearing intact .meOropharynx- clear Neck- supple, no JVP Lymph- no cervical  lymphadenopathy Lungs- Clear to ausculation bilaterally, normal work of breathing Heart- Regular rate and rhythm, no murmurs, rubs or gallops, PMI not laterally displaced GI- soft, NT, ND, + BS Extremities- no clubbing, cyanosis, or edema MS- no significant deformity or atrophy Skin- no rash or lesion Psych- euthymic mood, full affect Neuro- strength and sensation are intact  EKG- Sinus brady 55 bpm, Pr int 152 ms, QRS 84 ms, QTc 417 ms.  ILR interrogation reviewed, see HPI for details.  Echo Left ventricle: The cavity size was normal. Wall thickness was normal. Systolic function was normal. The estimated ejection fraction was in the range of 55% to 60%. -  Mitral valve: There was mild regurgitation.  Assessment and Plan:  1. PAF with termination pauses and tendency to bradycardia Pt is off flecainide/metoprolol Feeling better Will discuss today's interrogation with Dr. Rayann Heman for the best way to proceed, ablation vrs ADD, but pt would not be able to tolerate medication that would contribute to bradycrdia, long termination pauses, which is what occurred with flecainide/BB. Ablation might be most appropriate, but pt would like to use medicine approach, if possible.  2. OSA  Continue cpap  3. Obesity Encouraged regular exercise and weight loss  MCH dietary class encouraged  4.HTN Stable  Quashon Jesus C. Camila [place], [facility] [address] [place], [place] [address] [phone]

## 2015-10-25 ENCOUNTER — Telehealth: Payer: Self-pay | Admitting: Internal Medicine

## 2015-10-25 NOTE — Telephone Encounter (Signed)
Able to reach patient.  Patient states that yesterday, 10/24/15, she had an episode around 12:30pm where she "felt really lightheaded".  She states that she knows that she is still having atrial fib, but that this episode felt different.  No episodes from that time automatically transmitted on Carelink, requested that patient send a manual transmission tonight when she gets home from work for review tomorrow.  Explained that I will call her back tomorrow morning after reviewing the full transmission.  Will also connect call to scheduler to schedule appointment with Dr. Rayann Heman to discuss ablation.  Advised to call our office or the AF clinic with worsening symptoms, questions, or concerns.  Patient voices understanding of all instructions and denies additional questions or concerns at this time.

## 2015-10-25 NOTE — Telephone Encounter (Signed)
Called pt at home per staff message pt needs fu to discuss afib ablation per Roderic Palau, husband said pt had episode yesterday and wants to know if we "caught" anything on her device-said she did not feel good yesterday-pls advise and let me know if she still needs this particular appt-pt at work number [phone]

## 2015-10-25 NOTE — Telephone Encounter (Signed)
LMOM requesting call back.  Gave device clinic phone number. 

## 2015-10-26 NOTE — Telephone Encounter (Signed)
Pt called and I informed her that our scheduler was trying to get in touch with her about an appt. Informed her that scheduler was with another pt and would call her back later. Pt was at work and informed me that when she tried to send remote transmission on Tuesday 10-25-15 her monitor gave her an error code but she could not remember which one. Instructed pt to call tech services when she got home tonight to receive help trouble shooting her home monitor. Pt verbalized understanding.

## 2015-11-02 NOTE — Telephone Encounter (Signed)
Patient returned phone call.  Explained that there are no episodes on her loop recorder from 10/24/15 at 12:30pm exactly, but that we will review all episodes stored on her LINQ when she is in office for an appointment with Dr. Rayann Heman on 11/14/15 at 8:45am.  Patient is agreeable to this plan.  She also states that she was able to reach [facility] services and that they were able to troubleshoot her monitor.  It is successfully automatically transmitting.  Patient denies any additional symptom episodes since 10/24/15 and is aware to call with worsening symptoms, questions or concerns.

## 2015-11-02 NOTE — Telephone Encounter (Signed)
[facility] requesting call back.  Called patient to discuss symptoms on 10/24/15 and to review manual transmission.

## 2015-11-07 ENCOUNTER — Ambulatory Visit: Payer: 59 | Admitting: Cardiology

## 2015-11-09 ENCOUNTER — Ambulatory Visit (INDEPENDENT_AMBULATORY_CARE_PROVIDER_SITE_OTHER): Payer: 59 | Admitting: *Deleted

## 2015-11-09 DIAGNOSIS — I480 Paroxysmal atrial fibrillation: Secondary | ICD-10-CM

## 2015-11-09 NOTE — Progress Notes (Signed)
Carelink Summary Report / Loop Recorder 

## 2015-11-11 LAB — CUP PACEART REMOTE DEVICE CHECK: Date Time Interrogation Session: 20161024113542

## 2015-11-11 NOTE — Progress Notes (Signed)
Carelink summary report received. Battery status OK. Normal device function. No new symptom, brady, or pause episodes. 15 tachy episodes--all available ECGs suggest AF w/RVR. 107 AF episodes (burden 0.9%), +Xarelto and diltiazem, avg V rates moderately controlled. Monthly summary reports and ROV with JA on 11/14/15 at 8:45am to discuss AF ablation.

## 2015-11-14 ENCOUNTER — Ambulatory Visit (INDEPENDENT_AMBULATORY_CARE_PROVIDER_SITE_OTHER): Payer: 59 | Admitting: Internal Medicine

## 2015-11-14 ENCOUNTER — Encounter: Payer: Self-pay | Admitting: Internal Medicine

## 2015-11-14 VITALS — BP 118/70 | HR 53 | Ht 66.0 in | Wt 205.2 lb

## 2015-11-14 DIAGNOSIS — R0602 Shortness of breath: Secondary | ICD-10-CM | POA: Diagnosis not present

## 2015-11-14 DIAGNOSIS — R002 Palpitations: Secondary | ICD-10-CM | POA: Diagnosis not present

## 2015-11-14 DIAGNOSIS — I480 Paroxysmal atrial fibrillation: Secondary | ICD-10-CM | POA: Diagnosis not present

## 2015-11-14 DIAGNOSIS — I10 Essential (primary) hypertension: Secondary | ICD-10-CM

## 2015-11-14 LAB — CUP PACEART INCLINIC DEVICE CHECK: Date Time Interrogation Session: 20161128112638

## 2015-11-14 NOTE — Progress Notes (Signed)
Electrophysiology Office Note   Date:  11/14/2015   ID:  Jacqueline [name], DOB 1951-10-27, MRN [id number]  PCP:  Horatio Pel, MD  Cardiologist:  Dr Meda Coffee Primary Electrophysiologist: [name], MD    Chief Complaint  Patient presents with  . PAF     History of Present Illness: Jacqueline [name] is a 64 y.o. female who presents today for electrophysiology evaluation.  She has a h/o PAF that first diagnosed in April of this year but was treated for palpitaions in 2010 with metoprolol.  She was recently seen in consult by Dr. Meda Coffee, wore a holter monitor, which confirmed afib  and started on flecainide 50 mg bid, toprol stopped  and xarelto started  for a chadsvasc score of 3.She had a echo that showed normal EF, normal left atrial size. On f/u visit, she reported some lightheaded episodes, and had an ekg showing afib that converted to SR with a 3.6 termination pause  recorded in the office. Flecainide increased to 100 mg bid. Repeat holter showed frequent PVC's and non sustained ventricular tach up to 10 beats with profound sinus bradycardia and multiple pauses during awake hours, longest pause was 3.5 ms,episodes of atrial flutter. With this report, flecainide was stopped  and pt referred to afib clinic for further evaluation.  She has since had an ILR implanted 8/16.  This has documented ongoing atrial fibrillation and atrial flutter.  She also has sinus bradycardia.   No pauses or other arrhythmia noted.  She is not very active.  She reports fatigue and shortness of breath with activity.  Recent ETT was low risk.  She denies any tobacco use, no alcohol use, uses cpap faithfully, minimal caffeine use. She does not have a regular exercise program and is overweight.  Today, she denies symptoms of palpitations, chest pain, shortness of breath, orthopnea, PND, lower extremity edema, claudication, dizziness, presyncope, syncope, bleeding, or neurologic sequela. The patient is tolerating  medications without difficulties and is otherwise without complaint today.    Past Medical History  Diagnosis Date  . Stress incontinence, female   . HTN (hypertension)   . Hyperlipidemia   . GERD (gastroesophageal reflux disease)   . Palpitations   . Paroxysmal atrial fibrillation (HCC)   . Gait abnormality   . Typical atrial flutter [facility])    Past Surgical History  Procedure Laterality Date  . Vaginal hysterectomy    . Anterior and posterior vaginal repair    . Burch procedure    . Ep implantable device N/A 08/11/2015    Procedure: Loop Recorder Insertion;  Surgeon: [name], MD;  Location: [facility];  Service: Cardiovascular;  Laterality: N/A;     Current Outpatient Prescriptions  Medication Sig Dispense Refill  . acetaminophen (TYLENOL) 500 MG tablet Take 500 mg by mouth every 8 (eight) hours as needed for mild pain or moderate pain.    Marland Kitchen ACIPHEX 20 MG tablet Take 20 mg by mouth daily.    Marland Kitchen atorvastatin (LIPITOR) 20 MG tablet Take 10 mg by mouth daily.     . Calcium Carbonate (CALTRATE 600 PO) Take 600 mg by mouth daily.     . cetirizine (ZYRTEC) 10 MG tablet Take 10 mg by mouth daily.      . Cranberry 405 MG CAPS Take 405 mg by mouth daily.     Marland Kitchen diltiazem (CARDIZEM) 30 MG tablet Take 1 tablet by mouth every 4 hours AS NEEDED for HR >100 as long BP>100    .  fluticasone (FLONASE) 50 MCG/ACT nasal spray Place 1 spray into both nostrils daily as needed for allergies or rhinitis.    Marland Kitchen lisinopril-hydrochlorothiazide (PRINZIDE,ZESTORETIC) 20-12.5 MG per tablet Take 1 tablet by mouth daily.    . Multiple Vitamins-Minerals (CENTRUM PO) Take 1 tablet by mouth daily.      . pramipexole (MIRAPEX) 0.125 MG tablet Take 1-2 tablets by mouth every evening. 3 hours before bedtime  2  . XARELTO 20 MG TABS tablet TAKE 1 TABLET (20 MG TOTAL) BY MOUTH DAILY WITH SUPPER. 30 tablet 3   No current facility-administered medications for this visit.    Allergies:   Review of patient's  allergies indicates no known allergies.   Social History:  The patient  reports that she has never smoked. She has never used smokeless tobacco. She reports that she drinks alcohol. She reports that she does not use illicit drugs.   Family History:  The patient's  family history includes Bradycardia in her father; Breast cancer in her mother; Colon cancer in her mother; Diabetes in her maternal grandmother and paternal grandmother; Heart block in her father; Heart disease in her paternal uncle.    ROS:  Please see the history of present illness.   All other systems are reviewed and negative.    PHYSICAL EXAM: VS:  BP 118/70 mmHg  Pulse 53  Ht 5\' 6"  (1.676 m)  Wt 205 lb 3.2 oz (93.078 kg)  BMI 33.14 kg/m2 , BMI Body mass index is 33.14 kg/(m^2). GEN: Well nourished, well developed, in no acute distress HEENT: normal Neck: no JVD, carotid bruits, or masses Cardiac: RRR; no murmurs, rubs, or gallops,no edema  Respiratory:  clear to auscultation bilaterally, normal work of breathing GI: soft, nontender, nondistended, + BS MS: no deformity or atrophy Skin: warm and dry  Neuro:  Strength and sensation are intact Psych: euthymic mood, full affect  EKG:  EKG is ordered today. The ekg ordered today shows sinus rhythm   Recent Labs: No results found for requested labs within last 365 days.    Lipid Panel  No results found for: CHOL, TRIG, HDL, CHOLHDL, VLDL, LDLCALC, LDLDIRECT   Wt Readings from Last 3 Encounters:  11/14/15 205 lb 3.2 oz (93.078 kg)  10/17/15 204 lb 12.8 oz (92.897 kg)  09/13/15 203 lb (92.08 kg)      Other studies Reviewed: Additional studies/ records that were reviewed today include: Bethanie Dicker notes, ILR interrogation today, echo  Review of the above records today demonstrates: EF 55-60%, mild MR,   ETT is reviewed   ASSESSMENT AND PLAN:  1.  Paroxysmal atrial fibrillation and atrial flutter The patient has symptomatic atrial arrhythmias.  She has  failed medical therapy with flecainide.  Further AAD options are limited by bradycardia.  She is clear that she would like to avoid pacing. Therapeutic strategies for afib and atrial flutter including medicine and ablation were discussed in detail with the patient today. Risk, benefits, and alternatives to EP study and radiofrequency ablation were also discussed in detail today. These risks include but are not limited to stroke, bleeding, vascular damage, tamponade, perforation, damage to the esophagus, lungs, and other structures, pulmonary vein stenosis, worsening renal function, and death. The patient understands these risk and wishes to proceed.  We will therefore proceed with catheter ablation at the next available time.   Continue current medicines in the interim.  Will obtain Cardiac CT rather than TEE prior to ablation. ILR is interrogated today and reveals afib/ atrial flutter  burden 1%  2. SOB Likely due to deconditioning. Will obtain Cardiac CT to evaluate coronary arteries and evaluate pulmonary vein anatomy prior to ablation.  3. Obesity Regular exercise and weight reduction encouraged I have reviewed the patients BMI and decreased success rates with ablation at length today.  Weight loss is strongly advised.  Per Guijian et al (PACE 2013; [id number]), patients with BMI 25-29.9 (obese) have a 27% increase in AF recurrence post ablation.  Patients with BMI >30 have a 31% increase in AF recurrence post ablation when compared to those with BMI <25.  4. HTN Stable No change required today   Current medicines are reviewed at length with the patient today.   The patient does not have concerns regarding her medicines.  The following changes were made today:  none  Labs/ tests ordered today include:  Orders Placed This Encounter  Procedures  . CT Heart Morp W/Cta Cor W/Score W/Ca W/Cm &/Or Wo/Cm  . Basic metabolic panel  . CBC with Differential/Platelet  . Implantable device check      Signed, [name], MD  11/14/2015 7:42 PM     [address] [place] [address] [phone] (office) [phone] (fax)

## 2015-11-14 NOTE — Patient Instructions (Addendum)
Medication Instructions:  Your physician recommends that you continue on your current medications as directed. Please refer to the Current Medication list given to you today.   Labwork: Your physician recommends that you return for lab work on 12/08/15 at 8:00am   Testing/Procedures: Your physician has recommended that you have an ablation. Catheter ablation is a medical procedure used to treat some cardiac arrhythmias (irregular heartbeats). During catheter ablation, a long, thin, flexible tube is put into a blood vessel in your groin (upper thigh), or neck. This tube is called an ablation catheter. It is then guided to your heart through the blood vessel. Radio frequency waves destroy small areas of heart tissue where abnormal heartbeats may cause an arrhythmia to start. Please see the instruction sheet given to you today.  Please check in at [facility] on 12/19/15 at 5:30am  Timmothy Sours not eat or drink after midnight and do not take any medications  Your physician has requested that you have cardiac CT. Cardiac computed tomography (CT) is a painless test that uses an x-ray machine to take clear, detailed pictures of your heart. For further information please visit [email or web address]. Please follow instruction sheet as given.      Follow-Up: Your physician recommends that you schedule a follow-up appointment in: 4 weeks from 12/22/15 with Roderic Palau, NP and 3 months from 12/22/15 with Dr Rayann Heman   Any Other Special Instructions Will Be Listed Below (If Applicable).     If you need a refill on your cardiac medications before your next appointment, please call your pharmacy.

## 2015-11-18 ENCOUNTER — Encounter: Payer: Self-pay | Admitting: Cardiovascular Disease

## 2015-11-30 ENCOUNTER — Encounter: Payer: Self-pay | Admitting: Cardiology

## 2015-12-08 ENCOUNTER — Other Ambulatory Visit: Payer: Self-pay | Admitting: *Deleted

## 2015-12-08 ENCOUNTER — Other Ambulatory Visit (INDEPENDENT_AMBULATORY_CARE_PROVIDER_SITE_OTHER): Payer: 59 | Admitting: *Deleted

## 2015-12-08 DIAGNOSIS — R002 Palpitations: Secondary | ICD-10-CM

## 2015-12-08 DIAGNOSIS — I480 Paroxysmal atrial fibrillation: Secondary | ICD-10-CM

## 2015-12-08 LAB — CBC WITH DIFFERENTIAL/PLATELET
Basophils Absolute: 0 10*3/uL (ref 0.0–0.1)
Basophils Relative: 0 % (ref 0–1)
Eosinophils Absolute: 0.2 10*3/uL (ref 0.0–0.7)
Eosinophils Relative: 3 % (ref 0–5)
HCT: 40.6 % (ref 36.0–46.0)
Hemoglobin: 13.8 g/dL (ref 12.0–15.0)
Lymphocytes Relative: 33 % (ref 12–46)
Lymphs Abs: 2.1 10*3/uL (ref 0.7–4.0)
MCH: 30.4 pg (ref 26.0–34.0)
MCHC: 34 g/dL (ref 30.0–36.0)
MCV: 89.4 fL (ref 78.0–100.0)
MPV: 10 fL (ref 8.6–12.4)
Monocytes Absolute: 0.7 10*3/uL (ref 0.1–1.0)
Monocytes Relative: 11 % (ref 3–12)
Neutro Abs: 3.4 10*3/uL (ref 1.7–7.7)
Neutrophils Relative %: 53 % (ref 43–77)
Platelets: 246 10*3/uL (ref 150–400)
RBC: 4.54 MIL/uL (ref 3.87–5.11)
RDW: 12.8 % (ref 11.5–15.5)
WBC: 6.5 10*3/uL (ref 4.0–10.5)

## 2015-12-08 LAB — BASIC METABOLIC PANEL
BUN: 15 mg/dL (ref 7–25)
CO2: 29 mmol/L (ref 20–31)
Calcium: 9 mg/dL (ref 8.6–10.4)
Chloride: 99 mmol/L (ref 98–110)
Creat: 1.16 mg/dL — ABNORMAL HIGH (ref 0.50–0.99)
Glucose, Bld: 147 mg/dL — ABNORMAL HIGH (ref 65–99)
Potassium: 3.7 mmol/L (ref 3.5–5.3)
Sodium: 135 mmol/L (ref 135–146)

## 2015-12-08 NOTE — Addendum Note (Signed)
Addended by: Eulis Foster on: 12/08/2015 08:04 AM   Modules accepted: Orders

## 2015-12-09 ENCOUNTER — Ambulatory Visit (INDEPENDENT_AMBULATORY_CARE_PROVIDER_SITE_OTHER): Payer: 59 | Admitting: *Deleted

## 2015-12-09 DIAGNOSIS — I480 Paroxysmal atrial fibrillation: Secondary | ICD-10-CM | POA: Diagnosis not present

## 2015-12-09 NOTE — Progress Notes (Signed)
Carelink Summary Report / Loop Recorder 

## 2015-12-14 ENCOUNTER — Ambulatory Visit (HOSPITAL_COMMUNITY)
Admission: RE | Admit: 2015-12-14 | Discharge: 2015-12-14 | Disposition: A | Discharge status: Home / Self Care | Payer: 59 | Source: Ambulatory Visit | Attending: Internal Medicine | Admitting: Internal Medicine

## 2015-12-14 DIAGNOSIS — I480 Paroxysmal atrial fibrillation: Secondary | ICD-10-CM | POA: Insufficient documentation

## 2015-12-14 DIAGNOSIS — K449 Diaphragmatic hernia without obstruction or gangrene: Secondary | ICD-10-CM | POA: Diagnosis not present

## 2015-12-14 DIAGNOSIS — R0602 Shortness of breath: Secondary | ICD-10-CM | POA: Diagnosis present

## 2015-12-14 IMAGING — CT CT HEART MORP W/ CTA COR W/ SCORE W/ CA W/CM &/OR W/O CM
1 of 7 series · 1 of 20 positions shown, 2 images · IV contrast (Iodine)
Comparison: Chest radiograph of [DATE]

EXAM:
OVER-READ INTERPRETATION  CT CHEST

The following report is an over-read performed by radiologist Dr.
does not include interpretation of cardiac or coronary anatomy or
pathology. The CTA interpretation by the cardiologist is attached.
CLINICAL DATA: Pre Ablation
Cardiac CTA
MEDICATIONS:
None
TECHNIQUE: The patient was scanned on a [employer] [REDACTED]ice scanner. Gantry
rotation speed was 270 msecs. Collimation was .9mm. A 100 kV
prospective scan was triggered in the descending thoracic aorta at
111 HU's with 5% padding centered around 78% of the R-R interval.
Average HR during the scan was 50 bpm. The 3D data set was
interpreted on a dedicated work station using MPR, MIP and VRT
modes. A total of 80cc of contrast was used.

[Series 200: locator · axial · 0.35mm/px · z∈[-159,-159]mm · 1 of 1 slices shown, 2 images]
[im 1/1  vessel]
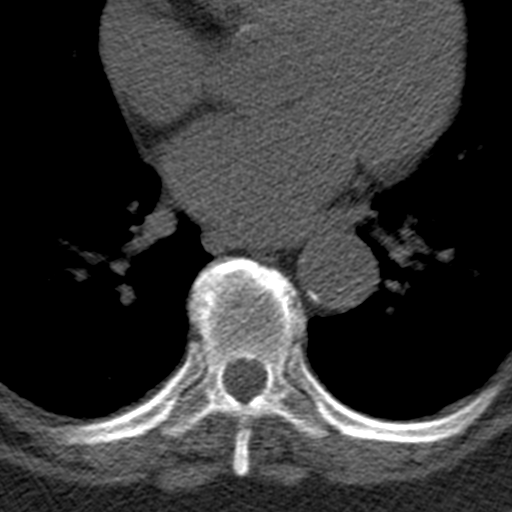
[im 1/1  lung]
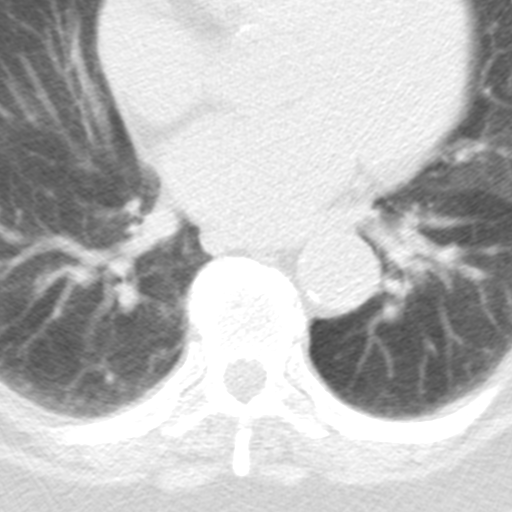

[1 of 20 positions shown; findings below may reference images not displayed]

FINDINGS: Mediastinum/Nodes: No mediastinal or hilar adenopathy. No central
pulmonary embolism, on this non-dedicated study. A small hiatal
hernia.

Lungs/Pleura: No imaged pericardial effusion. Imaged portions of the
lungs are clear.

Upper abdomen: Normal imaged portions of the liver, spleen, right
adrenal gland.

Musculoskeletal: Moderate thoracic spondylosis.
IMPRESSION: 1. No acute extracardiac findings within the imaged chest.
2. Small hiatal hernia.
FINDINGS: The LA was mildly dilated. PSL length was 38 mm. The RA/RV and LV
were normal in size. The PANGILINAN was conical. There was no PANGILINAN thrombus.
There were 4 pulmonary veins entering the posterior LA in normal
fashion. There was no pericardial effusion. The esophagus coursed
close to the RLPV. Measurements were as follows

LLPV:  Ostial dimension 15.3 mm Area:  1.12 cm2

LUPV:  Ostial dimension 11.9 mm Area:  1.0 cm2

RUPV:  Ostial dimension 21.3 mm Area:  2.9 cm2

RLPV:  Ostial dimension 19..5 mm Area:  2.7 cm2

PANGILINAN:  Ostium 12.3 mm Depth 19.7 mm
IMPRESSION: 1) Mild LAE PSL dimension 38 mm

2) 4 normal pulmonary veins entering posterior LA see measurements
above

3) Conical PANGILINAN with no thrombus

4) Esophagus in proximity to the right lower pulmonary vein

5) No pericardial effusion

PANGILINAN

## 2015-12-14 MED ORDER — IOHEXOL 350 MG/ML SOLN
80.0000 mL | Freq: Once | INTRAVENOUS | Status: AC | PRN
  Administered 2015-12-14: 100 mL via INTRAVENOUS

## 2015-12-22 ENCOUNTER — Ambulatory Visit (HOSPITAL_COMMUNITY)
Admission: RE | Admit: 2015-12-22 | Discharge: 2015-12-23 | Disposition: A | Discharge status: Home / Self Care | Payer: 59 | Source: Ambulatory Visit | Attending: Internal Medicine | Admitting: Internal Medicine

## 2015-12-22 ENCOUNTER — Ambulatory Visit (HOSPITAL_COMMUNITY): Payer: 59 | Admitting: Anesthesiology

## 2015-12-22 ENCOUNTER — Encounter (HOSPITAL_COMMUNITY)
Admission: RE | Disposition: A | Discharge status: Home / Self Care | Payer: Self-pay | Source: Ambulatory Visit | Attending: Internal Medicine

## 2015-12-22 ENCOUNTER — Encounter (HOSPITAL_COMMUNITY): Payer: Self-pay | Admitting: Internal Medicine

## 2015-12-22 DIAGNOSIS — Z7901 Long term (current) use of anticoagulants: Secondary | ICD-10-CM | POA: Diagnosis not present

## 2015-12-22 DIAGNOSIS — E785 Hyperlipidemia, unspecified: Secondary | ICD-10-CM | POA: Diagnosis not present

## 2015-12-22 DIAGNOSIS — I483 Typical atrial flutter: Secondary | ICD-10-CM | POA: Insufficient documentation

## 2015-12-22 DIAGNOSIS — I10 Essential (primary) hypertension: Secondary | ICD-10-CM | POA: Insufficient documentation

## 2015-12-22 DIAGNOSIS — I480 Paroxysmal atrial fibrillation: Secondary | ICD-10-CM | POA: Diagnosis not present

## 2015-12-22 DIAGNOSIS — I4891 Unspecified atrial fibrillation: Secondary | ICD-10-CM | POA: Diagnosis present

## 2015-12-22 HISTORY — PX: ELECTROPHYSIOLOGIC STUDY: SHX172A

## 2015-12-22 LAB — POCT ACTIVATED CLOTTING TIME
Activated Clotting Time: 317 seconds
Activated Clotting Time: 286 seconds
Activated Clotting Time: 317 s
Activated Clotting Time: 291 s
Activated Clotting Time: 209 s
Activated Clotting Time: 224 s
Activated Clotting Time: 204 seconds

## 2015-12-22 LAB — GLUCOSE, CAPILLARY: Glucose-Capillary: 151 mg/dL — ABNORMAL HIGH (ref 65–99)

## 2015-12-22 LAB — MRSA PCR SCREENING: MRSA by PCR: NEGATIVE

## 2015-12-22 SURGERY — ATRIAL FIBRILLATION ABLATION
Anesthesia: General

## 2015-12-22 MED ORDER — ROCURONIUM BROMIDE 100 MG/10ML IV SOLN: INTRAVENOUS | Status: DC | PRN

## 2015-12-22 MED ORDER — PRAMIPEXOLE DIHYDROCHLORIDE 0.25 MG PO TABS
0.2500 mg | ORAL_TABLET | Freq: Every evening | ORAL | Status: DC
  Administered 2015-12-22: 0.25 mg via ORAL
  Filled 2015-12-22 (×2): qty 1

## 2015-12-22 MED ORDER — LIDOCAINE HCL (CARDIAC) 20 MG/ML IV SOLN
INTRAVENOUS | Status: DC | PRN
  Administered 2015-12-22: 60 mg via INTRAVENOUS

## 2015-12-22 MED ORDER — IOHEXOL 350 MG/ML SOLN
INTRAVENOUS | Status: DC | PRN
  Administered 2015-12-22: 3 mL via INTRACARDIAC

## 2015-12-22 MED ORDER — FENTANYL CITRATE (PF) 100 MCG/2ML IJ SOLN
INTRAMUSCULAR | Status: DC | PRN
  Administered 2015-12-22 (×6): 25 ug via INTRAVENOUS
  Administered 2015-12-22: 50 ug via INTRAVENOUS

## 2015-12-22 MED ORDER — HEPARIN SODIUM (PORCINE) 1000 UNIT/ML IJ SOLN
INTRAMUSCULAR | Status: AC
  Filled 2015-12-22: qty 1

## 2015-12-22 MED ORDER — SODIUM CHLORIDE 0.9 % IJ SOLN: 3.0000 mL | INTRAMUSCULAR | Status: DC | PRN

## 2015-12-22 MED ORDER — PROPOFOL 500 MG/50ML IV EMUL
INTRAVENOUS | Status: DC | PRN
  Administered 2015-12-22: 20 ug/kg/min via INTRAVENOUS

## 2015-12-22 MED ORDER — FENTANYL CITRATE (PF) 100 MCG/2ML IJ SOLN: 25.0000 ug | INTRAMUSCULAR | Status: DC | PRN

## 2015-12-22 MED ORDER — ACETAMINOPHEN 325 MG PO TABS: 650.0000 mg | ORAL_TABLET | ORAL | Status: DC | PRN

## 2015-12-22 MED ORDER — SODIUM CHLORIDE 0.9 % IJ SOLN
3.0000 mL | Freq: Two times a day (BID) | INTRAMUSCULAR | Status: DC
  Administered 2015-12-22: 3 mL via INTRAVENOUS

## 2015-12-22 MED ORDER — LISINOPRIL-HYDROCHLOROTHIAZIDE 20-12.5 MG PO TABS: 1.0000 | ORAL_TABLET | Freq: Every day | ORAL | Status: DC

## 2015-12-22 MED ORDER — HYDROCODONE-ACETAMINOPHEN 5-325 MG PO TABS: 1.0000 | ORAL_TABLET | ORAL | Status: DC | PRN

## 2015-12-22 MED ORDER — BUPIVACAINE HCL (PF) 0.25 % IJ SOLN
INTRAMUSCULAR | Status: DC | PRN
  Administered 2015-12-22: 15 mL

## 2015-12-22 MED ORDER — BUPIVACAINE HCL (PF) 0.25 % IJ SOLN
INTRAMUSCULAR | Status: AC
  Filled 2015-12-22: qty 30

## 2015-12-22 MED ORDER — DOBUTAMINE IN D5W 4-5 MG/ML-% IV SOLN
INTRAVENOUS | Status: DC | PRN
  Administered 2015-12-22: 20 ug/kg/min via INTRAVENOUS

## 2015-12-22 MED ORDER — PROTAMINE SULFATE 10 MG/ML IV SOLN: 30.0000 mg | Freq: Once | INTRAVENOUS | Status: DC

## 2015-12-22 MED ORDER — PANTOPRAZOLE SODIUM 40 MG PO TBEC
40.0000 mg | DELAYED_RELEASE_TABLET | Freq: Every day | ORAL | Status: DC
  Administered 2015-12-22 – 2015-12-23 (×2): 40 mg via ORAL
  Filled 2015-12-22 (×2): qty 1

## 2015-12-22 MED ORDER — GLYCOPYRROLATE 0.2 MG/ML IJ SOLN
INTRAMUSCULAR | Status: DC | PRN
  Administered 2015-12-22: 0.2 mg via INTRAVENOUS

## 2015-12-22 MED ORDER — SODIUM CHLORIDE 0.9 % IV SOLN: 250.0000 mL | INTRAVENOUS | Status: DC | PRN

## 2015-12-22 MED ORDER — SODIUM CHLORIDE 0.9 % IV SOLN
INTRAVENOUS | Status: DC
  Administered 2015-12-22: 1000 mL via INTRAVENOUS
  Administered 2015-12-22: 07:00:00 via INTRAVENOUS

## 2015-12-22 MED ORDER — RIVAROXABAN 20 MG PO TABS
20.0000 mg | ORAL_TABLET | Freq: Every day | ORAL | Status: DC
  Administered 2015-12-22: 20 mg via ORAL
  Filled 2015-12-22: qty 1

## 2015-12-22 MED ORDER — MIDAZOLAM HCL 5 MG/5ML IJ SOLN
INTRAMUSCULAR | Status: DC | PRN
  Administered 2015-12-22: 2 mg via INTRAVENOUS

## 2015-12-22 MED ORDER — PROPOFOL 10 MG/ML IV BOLUS
INTRAVENOUS | Status: DC | PRN
  Administered 2015-12-22: 150 mg via INTRAVENOUS

## 2015-12-22 MED ORDER — ONDANSETRON HCL 4 MG/2ML IJ SOLN: 4.0000 mg | Freq: Four times a day (QID) | INTRAMUSCULAR | Status: DC | PRN

## 2015-12-22 MED ORDER — ONDANSETRON HCL 4 MG/2ML IJ SOLN
INTRAMUSCULAR | Status: DC | PRN
  Administered 2015-12-22 (×2): 4 mg via INTRAVENOUS

## 2015-12-22 MED ORDER — SCOPOLAMINE 1 MG/3DAYS TD PT72
MEDICATED_PATCH | TRANSDERMAL | Status: AC
  Administered 2015-12-22: 1 via TRANSDERMAL
  Filled 2015-12-22: qty 1

## 2015-12-22 MED ORDER — PROMETHAZINE HCL 25 MG/ML IJ SOLN: 6.2500 mg | INTRAMUSCULAR | Status: DC | PRN

## 2015-12-22 MED ORDER — HEPARIN SODIUM (PORCINE) 1000 UNIT/ML IJ SOLN
INTRAMUSCULAR | Status: DC | PRN
  Administered 2015-12-22: 1000 [IU] via INTRAVENOUS
  Administered 2015-12-22: 3000 [IU] via INTRAVENOUS

## 2015-12-22 MED ORDER — HYDROCHLOROTHIAZIDE 12.5 MG PO CAPS
12.5000 mg | ORAL_CAPSULE | Freq: Every day | ORAL | Status: DC
  Filled 2015-12-22: qty 1

## 2015-12-22 MED ORDER — LISINOPRIL 20 MG PO TABS: 20.0000 mg | ORAL_TABLET | Freq: Every day | ORAL | Status: DC

## 2015-12-22 MED ORDER — DOBUTAMINE IN D5W 4-5 MG/ML-% IV SOLN
INTRAVENOUS | Status: AC
  Filled 2015-12-22: qty 250

## 2015-12-22 MED ORDER — HEPARIN SODIUM (PORCINE) 1000 UNIT/ML IJ SOLN
INTRAMUSCULAR | Status: DC | PRN
  Administered 2015-12-22: 12000 [IU] via INTRAVENOUS

## 2015-12-22 MED ORDER — DEXAMETHASONE SODIUM PHOSPHATE 4 MG/ML IJ SOLN
INTRAMUSCULAR | Status: DC | PRN
Start: 2015-12-22 — End: 2015-12-22
  Administered 2015-12-22: 8 mg via INTRAVENOUS

## 2015-12-22 SURGICAL SUPPLY — 19 items
BAG SNAP BAND KOVER 36X36 (MISCELLANEOUS) ×4 IMPLANT
BLANKET WARM UNDERBOD FULL ACC (MISCELLANEOUS) ×2 IMPLANT
CATH NAVISTAR SMARTTOUCH DF (ABLATOR) ×2 IMPLANT
CATH SOUNDSTAR ECO REPROCESSED (CATHETERS) ×1 IMPLANT
CATH VARIABLE LASSO NAV 2515 (CATHETERS) ×1 IMPLANT
CATH WEBSTER BI DIR CS D-F CRV (CATHETERS) ×2 IMPLANT
COVER SWIFTLINK CONNECTOR (BAG) ×2 IMPLANT
NDL TRANSEP BRK 71CM 407200 (NEEDLE) ×1 IMPLANT
NEEDLE TRANSEP BRK 71CM 407200 (NEEDLE) ×2 IMPLANT
PACK EP LATEX FREE (CUSTOM PROCEDURE TRAY) ×2
PACK EP LF (CUSTOM PROCEDURE TRAY) ×1 IMPLANT
PAD DEFIB LIFELINK (PAD) ×2 IMPLANT
PATCH CARTO3 (PAD) ×2 IMPLANT
SHEATH AVANTI 11F 11CM (SHEATH) ×2 IMPLANT
SHEATH PINNACLE 7F 10CM (SHEATH) ×4 IMPLANT
SHEATH PINNACLE 9F 10CM (SHEATH) ×2 IMPLANT
SHEATH SWARTZ TS SL2 63CM 8.5F (SHEATH) ×2 IMPLANT
SHIELD RADPAD SCOOP 12X17 (MISCELLANEOUS) ×2 IMPLANT
TUBING SMART ABLATE COOLFLOW (TUBING) ×2 IMPLANT

## 2015-12-22 NOTE — Anesthesia Preprocedure Evaluation (Addendum)
Anesthesia Evaluation  Patient identified by MRN, date of birth, ID band Patient awake    Reviewed: Allergy & Precautions, NPO status , Patient's Chart, lab work & pertinent test results  History of Anesthesia Complications (+) PONV  Airway Mallampati: II  TM Distance: >3 FB Neck ROM: Full    Dental no notable dental hx. (+) Teeth Intact, Dental Advisory Given   Pulmonary neg pulmonary ROS,    Pulmonary exam normal breath sounds clear to auscultation       Cardiovascular hypertension, Pt. on medications Normal cardiovascular exam+ dysrhythmias Atrial Fibrillation  Rhythm:Irregular Rate:Normal     Neuro/Psych negative neurological ROS  negative psych ROS   GI/Hepatic negative GI ROS, Neg liver ROS,   Endo/Other  negative endocrine ROS  Renal/GU negative Renal ROS  negative genitourinary   Musculoskeletal negative musculoskeletal ROS (+)   Abdominal (+)  Abdomen: soft. Bowel sounds: normal.  Peds negative pediatric ROS (+)  Hematology negative hematology ROS (+)   Anesthesia Other Findings   Reproductive/Obstetrics negative OB ROS                         Anesthesia Physical Anesthesia Plan  ASA: III  Anesthesia Plan: General   Post-op Pain Management:    Induction: Intravenous  Airway Management Planned: LMA and Oral ETT  Additional Equipment:   Intra-op Plan:   Post-operative Plan: Extubation in OR  Informed Consent: I have reviewed the patients History and Physical, chart, labs and discussed the procedure including the risks, benefits and alternatives for the proposed anesthesia with the patient or authorized representative who has indicated his/her understanding and acceptance.   Dental advisory given  Plan Discussed with: CRNA and Surgeon  Anesthesia Plan Comments:         Anesthesia Quick Evaluation

## 2015-12-22 NOTE — Progress Notes (Addendum)
Site area: 3 rt fv sheaths removed Site Prior to Removal:  Level 0 Pressure Applied For:  15 minutes Manual:   yes Patient Status During Pull:  stable Post Pull Site:  Level  0 Post Pull Instructions Given:  yes Post Pull Pulses Present: yes Dressing Applied:  Small tegaderm Bedrest begins @ 1430 Comments:  IV saline locked

## 2015-12-22 NOTE — Anesthesia Postprocedure Evaluation (Signed)
Anesthesia Post Note  Patient: Jacqueline [name]  Procedure(s) Performed: Procedure(s) (LRB): Atrial Fibrillation Ablation (N/A)  Patient location during evaluation: PACU Anesthesia Type: General Level of consciousness: awake and alert Pain management: pain level controlled Vital Signs Assessment: post-procedure vital signs reviewed and stable Respiratory status: spontaneous breathing and respiratory function stable Cardiovascular status: blood pressure returned to baseline and stable Postop Assessment: no signs of nausea or vomiting Anesthetic complications: no    Last Vitals:  Filed Vitals:   12/22/15 0609 12/22/15 1125  BP: 119/74 104/57  Pulse: 43 70  Temp: 36.4 C   Resp: 18 17    Last Pain: There were no vitals filed for this visit.               Isiaha Greenup S

## 2015-12-22 NOTE — H&P (Signed)
PCP: Horatio Pel, MD Cardiologist: Dr Meda Coffee Primary Electrophysiologist: [name], MD   Chief Complaint  Patient presents with  . PAF    History of Present Illness: Jacqueline [name] is a 65 y.o. female who presents today for afib ablation. She has a h/o PAF that first diagnosed in April of this year but was treated for palpitaions in 2010 with metoprolol. She was recently seen in consult by Dr. Meda Coffee, wore a holter monitor, which confirmed afib and started on flecainide 50 mg bid, toprol stopped and xarelto started for a chadsvasc score of 3.She had a echo that showed normal EF, normal left atrial size. On f/u visit, she reported some lightheaded episodes, and had an ekg showing afib that converted to SR with a 3.6 termination pause recorded in the office. Flecainide increased to 100 mg bid. Repeat holter showed frequent PVC's and non sustained ventricular tach up to 10 beats with profound sinus bradycardia and multiple pauses during awake hours, longest pause was 3.5 ms,episodes of atrial flutter. With this report, flecainide was stopped and pt referred to afib clinic for further evaluation.   She has since had an ILR implanted 8/16. This has documented ongoing atrial fibrillation and atrial flutter. She also has sinus bradycardia. No pauses or other arrhythmia noted. She is not very active. She reports fatigue and shortness of breath with activity. Recent ETT was low risk.  She denies any tobacco use, no alcohol use, uses cpap faithfully, minimal caffeine use. She does not have a regular exercise program and is overweight.  Today, she denies symptoms of palpitations, chest pain, shortness of breath, orthopnea, PND, lower extremity edema, claudication, dizziness, presyncope, syncope, bleeding, or neurologic sequela. The patient is tolerating medications without difficulties and is otherwise without complaint today.    Past Medical History   Diagnosis Date  . Stress incontinence, female   . HTN (hypertension)   . Hyperlipidemia   . GERD (gastroesophageal reflux disease)   . Palpitations   . Paroxysmal atrial fibrillation (HCC)   . Gait abnormality   . Typical atrial flutter [facility])    Past Surgical History  Procedure Laterality Date  . Vaginal hysterectomy    . Anterior and posterior vaginal repair    . Burch procedure    . Ep implantable device N/A 08/11/2015    Procedure: Loop Recorder Insertion; Surgeon: [name], MD; Location: [facility]; Service: Cardiovascular; Laterality: N/A;     Current Outpatient Prescriptions  Medication Sig Dispense Refill  . acetaminophen (TYLENOL) 500 MG tablet Take 500 mg by mouth every 8 (eight) hours as needed for mild pain or moderate pain.    Marland Kitchen ACIPHEX 20 MG tablet Take 20 mg by mouth daily.    Marland Kitchen atorvastatin (LIPITOR) 20 MG tablet Take 10 mg by mouth daily.     . Calcium Carbonate (CALTRATE 600 PO) Take 600 mg by mouth daily.     . cetirizine (ZYRTEC) 10 MG tablet Take 10 mg by mouth daily.     . Cranberry 405 MG CAPS Take 405 mg by mouth daily.     Marland Kitchen diltiazem (CARDIZEM) 30 MG tablet Take 1 tablet by mouth every 4 hours AS NEEDED for HR >100 as long BP>100    . fluticasone (FLONASE) 50 MCG/ACT nasal spray Place 1 spray into both nostrils daily as needed for allergies or rhinitis.    Marland Kitchen lisinopril-hydrochlorothiazide (PRINZIDE,ZESTORETIC) 20-12.5 MG per tablet Take 1 tablet by mouth daily.    . Multiple Vitamins-Minerals (CENTRUM  PO) Take 1 tablet by mouth daily.     . pramipexole (MIRAPEX) 0.125 MG tablet Take 1-2 tablets by mouth every evening. 3 hours before bedtime  2  . XARELTO 20 MG TABS tablet TAKE 1 TABLET (20 MG TOTAL) BY MOUTH DAILY WITH SUPPER. 30 tablet 3   No current facility-administered medications for this visit.    Allergies:  Review of patient's allergies indicates no known allergies.   Social History: The patient  reports that she has never smoked. She has never used smokeless tobacco. She reports that she drinks alcohol. She reports that she does not use illicit drugs.   Family History: The patient's family history includes Bradycardia in her father; Breast cancer in her mother; Colon cancer in her mother; Diabetes in her maternal grandmother and paternal grandmother; Heart block in her father; Heart disease in her paternal uncle.    ROS: Please see the history of present illness. All other systems are reviewed and negative.    PHYSICAL EXAM: Filed Vitals:   12/22/15 0609  BP: 119/74  Pulse: 43  Temp: 97.6 F (36.4 C)  Resp: 18   GEN: Well nourished, well developed, in no acute distress  HEENT: normal  Neck: no JVD, carotid bruits, or masses Cardiac: RRR; no murmurs, rubs, or gallops,no edema  Respiratory: clear to auscultation bilaterally, normal work of breathing GI: soft, nontender, nondistended, + BS MS: no deformity or atrophy  Skin: warm and dry  Neuro: Strength and sensation are intact Psych: euthymic mood, full affect   Lipid Panel   Labs (Brief)    No results found for: CHOL, TRIG, HDL, CHOLHDL, VLDL, LDLCALC, LDLDIRECT     Wt Readings from Last 3 Encounters:  11/14/15 205 lb 3.2 oz (93.078 kg)  10/17/15 204 lb 12.8 oz (92.897 kg)  09/13/15 203 lb (92.08 kg)      ASSESSMENT AND PLAN:  1. Paroxysmal atrial fibrillation and atrial flutter The patient has symptomatic atrial arrhythmias. She has failed medical therapy with flecainide. Further AAD options are limited by bradycardia. She is clear that she would like to avoid pacing. Therapeutic strategies for afib and atrial flutter including medicine and ablation were discussed in detail with the patient today. Risk, benefits, and alternatives to EP study and radiofrequency ablation were also discussed in  detail today. These risks include but are not limited to stroke, bleeding, vascular damage, tamponade, perforation, damage to the esophagus, lungs, and other structures, pulmonary vein stenosis, worsening renal function, and death. The patient understands these risk and wishes to proceed.   [name] MD, [facility] 12/22/2015 7:24 AM

## 2015-12-22 NOTE — Discharge Summary (Addendum)
Marland Kitchen    ELECTROPHYSIOLOGY PROCEDURE DISCHARGE SUMMARY    Patient ID: Jacqueline [name],  MRN: [id number], DOB/AGE: June 06, 1951 65 y.o.  Admit date: 12/22/2015 Discharge date:   Primary Care Physician: Horatio Pel, MD Primary Cardiologist: Dr. Meda Coffee Electrophysiologist: [name], MD  Primary Discharge Diagnosis:  1. Paroxysmal Afib     CHADS2Vasc is at least 3, on Xarelto  Secondary Discharge Diagnosis:  1. HTN 2.  HLD 3. Obesity   Procedures This Admission:  1.  Electrophysiology study and radiofrequency catheter ablation on 12/21/14  by Dr [name].  This study demonstrated: CONCLUSIONS: 1. Sinus rhythm upon presentation.  2. Prodigious conduction arising from the right superior pulmonary vein. This was likely her culprit for afib 3. Successful electrical isolation and anatomical encircling of all four pulmonary veins with radiofrequency current.  4. Cavo-tricuspid isthmus ablation was performed with complete bidirectional isthmus block achieved.  5. No inducible arrhythmias following ablation both on and off of dobutamine  6. No early apparent complications.    Brief HPI: Jacqueline [name] is a 65 y.o. female with a history of paroxysmal atrial fibrillation.  They have failed medical therapy with Flecainide noting NSVT. Risks, benefits, and alternatives to catheter ablation of atrial fibrillation were reviewed with the patient who wished to proceed.  The patient underwent cardiac CT prior to the procedure which demonstrated no LAA thrombus.    Hospital Course:  The patient was admitted and underwent EPS/RFCA of atrial fibrillation with details as outlined above.  They were monitored on telemetry overnight which demonstrated sinus rhythm.   Groin was without complication on the day of discharge.  The patient was examined by Dr. Rayann Heman and considered to be stable for discharge.  Wound care and restrictions were reviewed with the patient.  The patient will be seen  back by Roderic Palau, NP in 4 weeks and Dr Rayann Heman in 12 weeks for post ablation follow up.   This patients CHA2DS2-VASc Score and unadjusted Ischemic Stroke Rate (% per year) is equal to 3.2 % stroke rate/year from a score of 3 Above score calculated as 1 point each if present [CHF, HTN, DM, Vascular=MI/PAD/Aortic Plaque, Age if 65-74, or Female] Above score calculated as 2 points each if present [Age > 75, or Stroke/TIA/TE]   Physical Exam: Filed Vitals:   12/22/15 2318 12/23/15 0400 12/23/15 0413 12/23/15 0741  BP: 97/53  94/62 103/56  Pulse: 47 48 54 58  Temp: 97.9 F (36.6 C) 98.1 F (36.7 C)  98.3 F (36.8 C)  TempSrc: Oral Oral  Oral  Resp: 13 13 12 13   Height:      Weight:      SpO2: 96% 96% 94% 96%   GEN- The patient is well appearing, alert and oriented x 3 today.   HEENT: normocephalic, atraumatic; sclera clear, conjunctiva pink; hearing intact; oropharynx clear; neck supple  Lungs- Clear to ausculation bilaterally, normal work of breathing.  No wheezes, rales, rhonchi Heart- Regular rate and rhythm, no murmurs, rubs or gallops  GI- soft, non-tender, non-distended, bowel sounds present  Extremities- no clubbing, cyanosis, or edema; DP/PT/radial pulses 2+ bilaterally, groin without hematoma/bruit MS- no significant deformity or atrophy Skin- warm and dry, no rash or lesion Psych- euthymic mood, full affect Neuro- strength and sensation are intact   Labs:   Lab Results  Component Value Date   WBC 6.5 12/08/2015   HGB 13.8 12/08/2015   HCT 40.6 12/08/2015   MCV 89.4 12/08/2015   PLT 246 12/08/2015  No results for input(s): NA, K, CL, CO2, BUN, CREATININE, CALCIUM, PROT, BILITOT, ALKPHOS, ALT, AST, GLUCOSE in the last 168 hours.  Invalid input(s): LABALBU   Discharge Medications:    Medication List    TAKE these medications        acetaminophen 500 MG tablet  Commonly known as:  TYLENOL  Take 500 mg by mouth every 8 (eight) hours as needed for mild pain  or moderate pain.     ACIPHEX 20 MG tablet  Generic drug:  RABEprazole  Take 20 mg by mouth daily.     atorvastatin 20 MG tablet  Commonly known as:  LIPITOR  Take 10 mg by mouth daily.     CALTRATE 600 PO  Take 600 mg by mouth daily.     CENTRUM PO  Take 1 tablet by mouth daily.     cetirizine 10 MG tablet  Commonly known as:  ZYRTEC  Take 10 mg by mouth daily.     Cranberry 405 MG Caps  Take 405 mg by mouth daily.     diltiazem 30 MG tablet  Commonly known as:  CARDIZEM  Take 1 tablet by mouth every 4 hours AS NEEDED for HR >100 as long BP>100     fluticasone 50 MCG/ACT nasal spray  Commonly known as:  FLONASE  Place 1 spray into both nostrils daily as needed for allergies or rhinitis.     lisinopril-hydrochlorothiazide 20-12.5 MG tablet  Commonly known as:  PRINZIDE,ZESTORETIC  Take 1 tablet by mouth daily.     pramipexole 0.125 MG tablet  Commonly known as:  MIRAPEX  Take 0.25 mg by mouth every evening. 3 hours before bedtime     XARELTO 20 MG Tabs tablet  Generic drug:  rivaroxaban  TAKE 1 TABLET (20 MG TOTAL) BY MOUTH DAILY WITH SUPPER.        Disposition: Home  Follow-up Information    Follow up with CARROLL,DONNA, NP On 01/23/2016.   Specialties:  Nurse Practitioner, Cardiology   Why:  2:30PM   Contact information:   [place] [address] [phone]       Follow up with [name], MD In 3 months.   Specialty:  Cardiology   Why:  our office scheduler will be calling to schedule a 3 month office visit with Dr. Amada [place] information:   [address] [place] [address] [phone]       Duration of Discharge Encounter: Greater than 30 minutes including physician time.  Signed, Tommye Standard, PA-C 12/23/2015 8:14 AM  I have seen, examined the patient, and reviewed the above assessment and plan.  Changes to above are made where necessary.    Co Sign: [name], MD 12/23/2015 8:15 AM

## 2015-12-22 NOTE — Progress Notes (Signed)
ACT 204. Spoke w/Dr. Rayann Heman; OK to pull sheaths

## 2015-12-22 NOTE — Anesthesia Procedure Notes (Signed)
Procedure Name: LMA Insertion Date/Time: 12/22/2015 7:53 AM Performed by: Lavell Luster Pre-anesthesia Checklist: Patient identified, Emergency Drugs available, Suction available, Patient being monitored and Timeout performed Patient Re-evaluated:Patient Re-evaluated prior to inductionOxygen Delivery Method: Circle system utilized Preoxygenation: Pre-oxygenation with 100% oxygen Intubation Type: IV induction Ventilation: Mask ventilation without difficulty LMA: LMA inserted LMA Size: 4.0 Tube type: Oral Number of attempts: 1 Tube secured with: Tape Dental Injury: Teeth and Oropharynx as per pre-operative assessment

## 2015-12-22 NOTE — Progress Notes (Signed)
Took over care of Jacqueline [name] from [place] for lunch relief.

## 2015-12-22 NOTE — Transfer of Care (Signed)
Immediate Anesthesia Transfer of Care Note  Patient: Jacqueline [name]  Procedure(s) Performed: Procedure(s): Atrial Fibrillation Ablation (N/A)  Patient Location: PACU  Anesthesia Type:General  Level of Consciousness: awake, alert , oriented and sedated  Airway & Oxygen Therapy: Patient connected to nasal cannula oxygen  Post-op Assessment: Report given to RN  Post vital signs: stable  Last Vitals:  Filed Vitals:   12/22/15 0609  BP: 119/74  Pulse: 43  Temp: 36.4 C  Resp: 18    Complications: No apparent anesthesia complications

## 2015-12-23 DIAGNOSIS — E785 Hyperlipidemia, unspecified: Secondary | ICD-10-CM | POA: Diagnosis not present

## 2015-12-23 DIAGNOSIS — I480 Paroxysmal atrial fibrillation: Secondary | ICD-10-CM

## 2015-12-23 DIAGNOSIS — I10 Essential (primary) hypertension: Secondary | ICD-10-CM | POA: Diagnosis not present

## 2015-12-23 DIAGNOSIS — I483 Typical atrial flutter: Secondary | ICD-10-CM | POA: Diagnosis not present

## 2015-12-23 NOTE — Discharge Instructions (Signed)
No driving for 4 days. No lifting over 5 lbs for 1 week. No sexual activity for 1 week. You may return to work on 7 days. Keep procedure site clean & dry. If you notice increased pain, swelling, bleeding or pus, call/return!  You may shower, but no soaking baths/hot tubs/pools for 1 week.    You have an appointment set up with the [facility].  Multiple studies have shown that being followed by a dedicated atrial fibrillation clinic in addition to the standard care you receive from your other physicians improves health. We believe that enrollment in the atrial fibrillation clinic will allow [place] to better care for you.   The phone number to the [facility] is [phone]. The clinic is staffed Monday through Friday from 8:30am to 5pm.  Parking Directions: The clinic is located in the Heart and Vascular Building connected to [facility]. 1)From [address] turn on to [address] and go to the 3rd entrance  (Heart and Vascular entrance) on the right. 2)Look to the right for Heart &Vascular Parking Garage. 3)A code for the entrance is required please call the clinic to receive this.    4)Take the elevators to the 1st floor. Registration is in the room with the glass walls at the end of the hallway.  If you have any trouble parking or locating the clinic, please dont hesitate to call [phone].

## 2015-12-24 LAB — CUP PACEART REMOTE DEVICE CHECK: Date Time Interrogation Session: 20161123120704

## 2016-01-09 ENCOUNTER — Ambulatory Visit (INDEPENDENT_AMBULATORY_CARE_PROVIDER_SITE_OTHER): Payer: 59 | Admitting: *Deleted

## 2016-01-09 DIAGNOSIS — I480 Paroxysmal atrial fibrillation: Secondary | ICD-10-CM | POA: Diagnosis not present

## 2016-01-10 NOTE — Progress Notes (Signed)
Carelink Summary Report / Loop Recorder 

## 2016-01-21 LAB — CUP PACEART REMOTE DEVICE CHECK: Date Time Interrogation Session: 20161223123557

## 2016-01-23 ENCOUNTER — Encounter (HOSPITAL_COMMUNITY): Payer: Self-pay | Admitting: Nurse Practitioner

## 2016-01-23 ENCOUNTER — Ambulatory Visit (HOSPITAL_COMMUNITY)
Admission: RE | Admit: 2016-01-23 | Discharge: 2016-01-23 | Disposition: A | Discharge status: Home / Self Care | Payer: 59 | Source: Ambulatory Visit | Attending: Nurse Practitioner | Admitting: Nurse Practitioner

## 2016-01-23 VITALS — BP 126/82 | HR 42 | Ht 67.0 in | Wt 204.2 lb

## 2016-01-23 DIAGNOSIS — I498 Other specified cardiac arrhythmias: Secondary | ICD-10-CM | POA: Insufficient documentation

## 2016-01-23 DIAGNOSIS — I10 Essential (primary) hypertension: Secondary | ICD-10-CM | POA: Diagnosis not present

## 2016-01-23 DIAGNOSIS — I4891 Unspecified atrial fibrillation: Secondary | ICD-10-CM | POA: Insufficient documentation

## 2016-01-23 DIAGNOSIS — Z7901 Long term (current) use of anticoagulants: Secondary | ICD-10-CM | POA: Diagnosis not present

## 2016-01-23 DIAGNOSIS — R002 Palpitations: Secondary | ICD-10-CM | POA: Insufficient documentation

## 2016-01-23 DIAGNOSIS — K219 Gastro-esophageal reflux disease without esophagitis: Secondary | ICD-10-CM | POA: Diagnosis not present

## 2016-01-23 DIAGNOSIS — E785 Hyperlipidemia, unspecified: Secondary | ICD-10-CM | POA: Diagnosis not present

## 2016-01-23 DIAGNOSIS — R001 Bradycardia, unspecified: Secondary | ICD-10-CM | POA: Insufficient documentation

## 2016-01-23 DIAGNOSIS — I480 Paroxysmal atrial fibrillation: Secondary | ICD-10-CM | POA: Diagnosis not present

## 2016-01-23 DIAGNOSIS — Z9889 Other specified postprocedural states: Secondary | ICD-10-CM | POA: Insufficient documentation

## 2016-01-23 NOTE — Progress Notes (Signed)
Patient ID: Jacqueline [name], female   DOB: November 29, 1951, 65 y.o.   MRN: [id number]     Primary Care Physician: Horatio Pel, MD Referring Physician: Dr. Holley Bouche is a 65 y.o. female with a h/o PAF that first diagnosed in April of this year but was treated for palpitaions in 2010 with metoprolol. She was recently seen in consult by Dr. Meda Coffee, wore a holter monitor, which confirmed afib and started on flecainide 50 mg bid, toprol stopped and xarelto started for a chadsvasc score of 3.She had a echo that showed normal EF, normal left atrial size. On f/u visit, she reported some lightheaded episodes, and had an ekg showing afib that converted to SR with a 3.6 termination pause recorded in the office. Flecainide increased to 100 mg bid. Repeat holter showed frequent PVC's and non sustained ventricular tach up to 10 beats with profound sinus bradycardia and multiple pauses during awake hours, longest pause was 3.5 ms,episodes of atrial flutter. With this report, flecainide was stopped and pt referred to afib clinic for further evaluation.  She has since had an ILR implanted 8/16. This has documented ongoing atrial fibrillation and atrial flutter. She also has sinus bradycardia. No pauses or other arrhythmia noted. She is not very active. She reports fatigue and shortness of breath with activity. Recent ETT was low risk.  She underwent afib ablation 1/5 and is in the afib clinic for f/u. She reports short burst of increased heartrate. No swallowing difficulties or rt groin issues. Continues on blood thinner.  Today, she denies symptoms of palpitations, chest pain, shortness of breath, orthopnea, PND, lower extremity edema, dizziness, presyncope, syncope, or neurologic sequela. The patient is tolerating medications without difficulties and is otherwise without complaint today.   Past Medical History  Diagnosis Date  . Stress incontinence, female   . HTN (hypertension)   .  Hyperlipidemia   . GERD (gastroesophageal reflux disease)   . Palpitations   . Paroxysmal atrial fibrillation (HCC)   . Gait abnormality   . Typical atrial flutter ([facility])   . Dysrhythmia     Afib   Past Surgical History  Procedure Laterality Date  . Vaginal hysterectomy    . Anterior and posterior vaginal repair    . Burch procedure    . Ep implantable device N/A 08/11/2015    Procedure: Loop Recorder Insertion;  Surgeon: [name], MD;  Location: [facility];  Service: Cardiovascular;  Laterality: N/A;  . Electrophysiologic study N/A 12/22/2015    Procedure: Atrial Fibrillation Ablation;  Surgeon: [name], MD;  Location: [facility];  Service: Cardiovascular;  Laterality: N/A;    Current Outpatient Prescriptions  Medication Sig Dispense Refill  . ACIPHEX 20 MG tablet Take 20 mg by mouth daily.    Marland Kitchen atorvastatin (LIPITOR) 20 MG tablet Take 10 mg by mouth daily.     . Calcium Carbonate (CALTRATE 600 PO) Take 600 mg by mouth daily.     . cetirizine (ZYRTEC) 10 MG tablet Take 10 mg by mouth daily.      . Cranberry 405 MG CAPS Take 405 mg by mouth daily.     . fluticasone (FLONASE) 50 MCG/ACT nasal spray Place 1 spray into both nostrils daily as needed for allergies or rhinitis.    Marland Kitchen lisinopril-hydrochlorothiazide (PRINZIDE,ZESTORETIC) 20-12.5 MG per tablet Take 1 tablet by mouth daily.    . Multiple Vitamins-Minerals (CENTRUM PO) Take 1 tablet by mouth daily.      Marland Kitchen  pramipexole (MIRAPEX) 0.125 MG tablet Take 0.25 mg by mouth every evening. 3 hours before bedtime  2  . XARELTO 20 MG TABS tablet TAKE 1 TABLET (20 MG TOTAL) BY MOUTH DAILY WITH SUPPER. 30 tablet 3  . acetaminophen (TYLENOL) 500 MG tablet Take 500 mg by mouth every 8 (eight) hours as needed for mild pain or moderate pain.    Marland Kitchen diltiazem (CARDIZEM) 30 MG tablet Reported on 01/23/2016     No current facility-administered medications for this encounter.    No Known Allergies  Social History   Social  History  . Marital Status: Married    Spouse Name: N/A  . Number of Children: 2  . Years of Education: 16   Occupational History  . Full time legal assistant    Social History Main Topics  . Smoking status: Never Smoker   . Smokeless tobacco: Never Used  . Alcohol Use: 0.0 oz/week    0 Standard drinks or equivalent per week     Comment: Occasional  . Drug Use: No  . Sexual Activity: Not on file   Other Topics Concern  . Not on file   Social History Narrative   Lives at home with husband.   2 biological children, 2 step-children.   Right-handed.   2-4 cups caffeine per day.    Family History  Problem Relation Age of Onset  . Heart block Father     probable  . Breast cancer Mother   . Colon cancer Mother   . Diabetes Paternal Grandmother   . Diabetes Maternal Grandmother   . Heart disease Paternal Uncle   . Bradycardia Father     ROS- All systems are reviewed and negative except as per the HPI above  Physical Exam: Filed Vitals:   01/23/16 1435  BP: 126/82  Pulse: 42  Height: 5\' 7"  (1.702 m)  Weight: 204 lb 3.2 oz (92.625 kg)    GEN- The patient is well appearing, alert and oriented x 3 today.   Head- normocephalic, atraumatic Eyes-  Sclera clear, conjunctiva pink Ears- hearing intact Oropharynx- clear Neck- supple, no JVP Lymph- no cervical lymphadenopathy Lungs- Clear to ausculation bilaterally, normal work of breathing Heart- Regular rate and rhythm, no murmurs, rubs or gallops, PMI not laterally displaced GI- soft, NT, ND, + BS Extremities- no clubbing, cyanosis, or edema MS- no significant deformity or atrophy Skin- no rash or lesion Psych- euthymic mood, full affect Neuro- strength and sensation are intact  EKG- Marked sinus brady at 42 bpm, pr int 170 ms, qrs int 84 ms, qtc 370 ms Epic records reviewed LInq reports reviewed  Assessment and Plan: 1. Afib  S/p ablation, in SR Some atrial activity but non sustained Continue xarelto  2.  Bradycardia Continue monitoring via linq. Asymptomatic   F/u with Dr. Rayann Heman in April as recall Afib clinic as needed

## 2016-02-07 ENCOUNTER — Ambulatory Visit (INDEPENDENT_AMBULATORY_CARE_PROVIDER_SITE_OTHER): Payer: 59 | Admitting: *Deleted

## 2016-02-07 DIAGNOSIS — I480 Paroxysmal atrial fibrillation: Secondary | ICD-10-CM

## 2016-02-08 NOTE — Progress Notes (Signed)
Carelink Summary Report / Loop Recorder 

## 2016-02-12 ENCOUNTER — Other Ambulatory Visit: Payer: Self-pay | Admitting: Cardiology

## 2016-02-24 LAB — CUP PACEART REMOTE DEVICE CHECK: Date Time Interrogation Session: 20170122123547

## 2016-03-08 ENCOUNTER — Ambulatory Visit (INDEPENDENT_AMBULATORY_CARE_PROVIDER_SITE_OTHER): Payer: 59 | Admitting: *Deleted

## 2016-03-08 DIAGNOSIS — I480 Paroxysmal atrial fibrillation: Secondary | ICD-10-CM

## 2016-03-08 NOTE — Progress Notes (Signed)
Carelink Summary Report / Loop Recorder 

## 2016-04-09 ENCOUNTER — Ambulatory Visit (INDEPENDENT_AMBULATORY_CARE_PROVIDER_SITE_OTHER): Payer: 59 | Admitting: *Deleted

## 2016-04-09 DIAGNOSIS — I480 Paroxysmal atrial fibrillation: Secondary | ICD-10-CM | POA: Diagnosis not present

## 2016-04-09 NOTE — Progress Notes (Signed)
Carelink Summary Report / Loop Recorder 

## 2016-04-12 ENCOUNTER — Other Ambulatory Visit: Payer: Self-pay | Admitting: Cardiology

## 2016-04-13 ENCOUNTER — Ambulatory Visit (INDEPENDENT_AMBULATORY_CARE_PROVIDER_SITE_OTHER): Payer: 59 | Admitting: Internal Medicine

## 2016-04-13 ENCOUNTER — Encounter: Payer: Self-pay | Admitting: Internal Medicine

## 2016-04-13 VITALS — BP 116/72 | HR 59 | Ht 67.0 in | Wt 205.4 lb

## 2016-04-13 DIAGNOSIS — I480 Paroxysmal atrial fibrillation: Secondary | ICD-10-CM

## 2016-04-13 DIAGNOSIS — I10 Essential (primary) hypertension: Secondary | ICD-10-CM

## 2016-04-13 LAB — CUP PACEART INCLINIC DEVICE CHECK: Date Time Interrogation Session: 20170428150324

## 2016-04-13 NOTE — Patient Instructions (Signed)

## 2016-04-13 NOTE — Progress Notes (Signed)
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  Since her recent Afib ablation, the patient reports doing very well.  She denies procedure related complications and is pleased with results.  Feels that afib has been "fixed".  Continues to have rare and intermittent palpitations.  Denies presyncope or syncope. Today, she denies symptoms of palpitations, chest pain, shortness of breath,  lower extremity edema, dizziness, presyncope, or other concerns.    Past Medical History  Diagnosis Date  . Stress incontinence, female   . HTN (hypertension)   . Hyperlipidemia   . GERD (gastroesophageal reflux disease)   . Palpitations   . Paroxysmal atrial fibrillation (HCC)   . Gait abnormality   . Typical atrial flutter ([place])   . Dysrhythmia     Afib   Past Surgical History  Procedure Laterality Date  . Vaginal hysterectomy    . Anterior and posterior vaginal repair    . Burch procedure    . Ep implantable device N/A 08/11/2015    Procedure: Loop Recorder Insertion;  Surgeon: [name], MD;  Location: [place] [facility];  Service: Cardiovascular;  Laterality: N/A;  . Electrophysiologic study N/A 12/22/2015    CTI and PVI ablation by Dr Rayann Heman    ROS- all systems are reviewed and negatives except as per HPI above  Current Outpatient Prescriptions  Medication Sig Dispense Refill  . acetaminophen (TYLENOL) 500 MG tablet Take 500 mg by mouth every 8 (eight) hours as needed for mild pain or moderate pain.    Marland Kitchen ACIPHEX 20 MG tablet Take 20 mg by mouth daily.    Marland Kitchen atorvastatin (LIPITOR) 20 MG tablet Take 10 mg by mouth daily.     . Calcium Carbonate (CALTRATE 600 PO) Take 600 mg by mouth daily.     . cetirizine (ZYRTEC) 10 MG tablet Take 10 mg by mouth daily.      . Cranberry 405 MG CAPS Take 405 mg by mouth daily.     Marland Kitchen diltiazem (CARDIZEM) 30 MG tablet Take 30 mg by mouth as  directed. Reported on 01/23/2016    . fluticasone (FLONASE) 50 MCG/ACT nasal spray Place 1 spray into both nostrils daily as needed for allergies or rhinitis.    Marland Kitchen lisinopril-hydrochlorothiazide (PRINZIDE,ZESTORETIC) 20-12.5 MG per tablet Take 1 tablet by mouth daily.    . Multiple Vitamins-Minerals (CENTRUM PO) Take 1 tablet by mouth daily.      . pramipexole (MIRAPEX) 0.125 MG tablet Take 0.25 mg by mouth every evening. 3 hours before bedtime  2  . XARELTO 20 MG TABS tablet TAKE 1 TABLET (20 MG TOTAL) BY MOUTH DAILY WITH SUPPER. 30 tablet 1   No current facility-administered medications for this visit.    Physical Exam: Filed Vitals:   04/13/16 1234  BP: 116/72  Pulse: 59  Height: 5\' 7"  (1.702 m)  Weight: 205 lb 6.4 oz (93.169 kg)    GEN- The patient is well appearing, alert and oriented x 3 today.   Head- normocephalic, atraumatic Eyes-  Sclera clear, conjunctiva pink Ears- hearing intact Oropharynx- clear Lungs- Clear to ausculation bilaterally, normal work of breathing Heart- Regular rate and rhythm, no murmurs, rubs or gallops, PMI not laterally displaced GI- soft, NT, ND, + BS Extremities- no clubbing, cyanosis, or edema  ekg today reveal sinus rhythm 59 bpm, otherwise normal ekg ILR interrogation reviewed  Assessment and Plan:  1. Paroxysmal atrial  fibrillation Doing well s/p ablation Some ERAF by device interrogation Overall burden is very low Given paucity of symptoms, no changes at this time  2. Sinus brady Post termination pauses noted without symptoms No changes  3. HTN Stable No change required today  Return to see me in 3 months  [name] MD, [facility] 04/13/2016 2:07 PM

## 2016-04-27 LAB — CUP PACEART REMOTE DEVICE CHECK: Date Time Interrogation Session: 20170221130635

## 2016-04-27 NOTE — Progress Notes (Signed)
Carelink summary report received. Battery status OK. Normal device function. No new symptom episodes, tachy episodes, or brady episodes. 2 pause episodes- 1 with available ECG, appears true pause 3 seconds. 14 AF episodes 3.2% +Xarelto Monthly summary reports and ROV/PRN

## 2016-05-07 ENCOUNTER — Ambulatory Visit (INDEPENDENT_AMBULATORY_CARE_PROVIDER_SITE_OTHER): Payer: 59 | Admitting: *Deleted

## 2016-05-07 DIAGNOSIS — I480 Paroxysmal atrial fibrillation: Secondary | ICD-10-CM | POA: Diagnosis not present

## 2016-05-07 NOTE — Progress Notes (Signed)
Carelink Summary Report / Loop Recorder 

## 2016-05-12 LAB — CUP PACEART REMOTE DEVICE CHECK: Date Time Interrogation Session: 20170323133615

## 2016-05-12 NOTE — Progress Notes (Signed)
Carelink summary report received. Battery status OK. Normal device function. No new symptom episodes, brady, or pause episodes. 3.9% AF, +Xarelto, V rates controlled. 1 tachy episode AF with RVR. Monthly summary reports and ROV/PRN

## 2016-05-14 LAB — CUP PACEART REMOTE DEVICE CHECK: Date Time Interrogation Session: 20170422140849

## 2016-05-14 NOTE — Progress Notes (Signed)
Carelink summary report received. Battery status OK. Normal device function. No new symptom episodes, brady episodes. 2.4% AF, +Xarelto, V rates controlled. 1 tachy episode AF with RVR. 1 post termination pause. Monthly summary reports and ROV/PRN

## 2016-06-05 ENCOUNTER — Encounter: Payer: Self-pay | Admitting: Internal Medicine

## 2016-06-06 ENCOUNTER — Ambulatory Visit (INDEPENDENT_AMBULATORY_CARE_PROVIDER_SITE_OTHER): Payer: 59 | Admitting: *Deleted

## 2016-06-06 DIAGNOSIS — I480 Paroxysmal atrial fibrillation: Secondary | ICD-10-CM | POA: Diagnosis not present

## 2016-06-06 NOTE — Progress Notes (Signed)
Carelink Summary Report / Loop Recorder 

## 2016-06-15 LAB — CUP PACEART REMOTE DEVICE CHECK
Date Time Interrogation Session: 20170522140635
Date Time Interrogation Session: 20170621143738

## 2016-07-06 ENCOUNTER — Ambulatory Visit (INDEPENDENT_AMBULATORY_CARE_PROVIDER_SITE_OTHER): Payer: 59 | Admitting: *Deleted

## 2016-07-06 DIAGNOSIS — I480 Paroxysmal atrial fibrillation: Secondary | ICD-10-CM

## 2016-07-06 NOTE — Progress Notes (Signed)
Carelink Summary Report / Loop Recorder 

## 2016-07-10 ENCOUNTER — Telehealth: Payer: Self-pay | Admitting: *Deleted

## 2016-07-10 NOTE — Telephone Encounter (Signed)
[facility] requesting call back.  Gave device clinic phone number.  Will ask patient if symptomatic during nocturnal post-termination brady episode on 07/08/16.  If asymptomatic, continue to monitor per Dr. Rayann Heman.

## 2016-07-11 NOTE — Telephone Encounter (Signed)
Pt returning your call

## 2016-07-16 NOTE — Telephone Encounter (Signed)
Able to reach patient.  She was asymptomatic with brady episode (nocturnal) on 07/08/16.  Patient is aware of her appointment with Dr. Rayann Heman scheduled for 07/18/16 at 11:00am.  She denies additional questions or concerns at this time and is appreciative of call.

## 2016-07-18 ENCOUNTER — Ambulatory Visit (INDEPENDENT_AMBULATORY_CARE_PROVIDER_SITE_OTHER): Payer: 59 | Admitting: Internal Medicine

## 2016-07-18 ENCOUNTER — Encounter: Payer: Self-pay | Admitting: Internal Medicine

## 2016-07-18 VITALS — BP 134/78 | HR 77 | Ht 66.0 in | Wt 203.6 lb

## 2016-07-18 DIAGNOSIS — I10 Essential (primary) hypertension: Secondary | ICD-10-CM | POA: Diagnosis not present

## 2016-07-18 DIAGNOSIS — I480 Paroxysmal atrial fibrillation: Secondary | ICD-10-CM

## 2016-07-18 DIAGNOSIS — I495 Sick sinus syndrome: Secondary | ICD-10-CM | POA: Diagnosis not present

## 2016-07-18 NOTE — Patient Instructions (Signed)

## 2016-07-18 NOTE — Progress Notes (Signed)
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  Continues to have rare and intermittent palpitations.  Overall feels that her afib is much improved.   Denies presyncope or syncope. Today, she denies symptoms of palpitations, chest pain, shortness of breath,  lower extremity edema, dizziness, presyncope, or other concerns.    Past Medical History:  Diagnosis Date  . Dysrhythmia    Afib  . Gait abnormality   . GERD (gastroesophageal reflux disease)   . HTN (hypertension)   . Hyperlipidemia   . Palpitations   . Paroxysmal atrial fibrillation (HCC)   . Stress incontinence, female   . Typical atrial flutter [facility])    Past Surgical History:  Procedure Laterality Date  . ANTERIOR AND POSTERIOR VAGINAL REPAIR    . BURCH PROCEDURE    . ELECTROPHYSIOLOGIC STUDY N/A 12/22/2015   CTI and PVI ablation by Dr Rayann Heman  . EP IMPLANTABLE DEVICE N/A 08/11/2015   Procedure: Loop Recorder Insertion;  Surgeon: [name], MD;  Location: [place] [facility];  Service: Cardiovascular;  Laterality: N/A;  . VAGINAL HYSTERECTOMY      ROS- all systems are reviewed and negatives except as per HPI above  Current Outpatient Prescriptions  Medication Sig Dispense Refill  . acetaminophen (TYLENOL) 500 MG tablet Take 500 mg by mouth every 8 (eight) hours as needed for mild pain or moderate pain.    Marland Kitchen ACIPHEX 20 MG tablet Take 20 mg by mouth daily.    Marland Kitchen atorvastatin (LIPITOR) 20 MG tablet Take 10 mg by mouth daily.     . Calcium Carbonate (CALTRATE 600 PO) Take 600 mg by mouth daily.     . cetirizine (ZYRTEC) 10 MG tablet Take 10 mg by mouth daily.      . Cranberry 405 MG CAPS Take 405 mg by mouth daily.     Marland Kitchen diltiazem (CARDIZEM) 30 MG tablet Take 30 mg by mouth as directed. Reported on 01/23/2016    . fluticasone (FLONASE) 50 MCG/ACT nasal spray Place 1 spray into both nostrils daily as needed for  allergies or rhinitis.    Marland Kitchen lisinopril-hydrochlorothiazide (PRINZIDE,ZESTORETIC) 20-12.5 MG per tablet Take 1 tablet by mouth daily.    . Multiple Vitamins-Minerals (CENTRUM PO) Take 1 tablet by mouth daily.      . pramipexole (MIRAPEX) 0.125 MG tablet Take 0.25 mg by mouth every evening. 3 hours before bedtime  2  . XARELTO 20 MG TABS tablet TAKE 1 TABLET (20 MG TOTAL) BY MOUTH DAILY WITH SUPPER. 30 tablet 3   No current facility-administered medications for this visit.     Physical Exam: Vitals:   07/18/16 1117  BP: 134/78  Pulse: 77  SpO2: 97%  Weight: 203 lb 9.6 oz (92.4 kg)  Height: 5\' 6"  (1.676 m)    GEN- The patient is well appearing, alert and oriented x 3 today.   Head- normocephalic, atraumatic Eyes-  Sclera clear, conjunctiva pink Ears- hearing intact Oropharynx- clear Lungs- Clear to ausculation bilaterally, normal work of breathing Heart- Regular rate and rhythm with frequent ectopy, no murmurs, rubs or gallops, PMI not laterally displaced GI- soft, NT, ND, + BS Extremities- no clubbing, cyanosis, or edema  ILR interrogation reviewed  Assessment and Plan:  1. Paroxysmal atrial fibrillation Doing well s/p ablation.  Given afib burden of 4% by ILR, I have advised that she consider repeat ablation.  Risk, benefits, and alternatives to  EP study and radiofrequency ablation for afib were also discussed in detail today. These risks include but are not limited to stroke, bleeding, vascular damage, tamponade, perforation, damage to the esophagus, lungs, and other structures, pulmonary vein stenosis, worsening renal function, and death. The patient understands these risk and wishes to think about this further.  She will contact my office if she decides to proceed. Continue xarelto.  2. Sinus brady Post termination pauses resolved.  Some asymptomatic nocturnal bradycardia is noted No changes  3. HTN Stable No change required today  Return to see me in 3 months  [name] MD, [facility] 07/18/2016 1:47 PM

## 2016-07-19 LAB — CUP PACEART REMOTE DEVICE CHECK: Date Time Interrogation Session: 20170802040500

## 2016-07-20 ENCOUNTER — Encounter: Payer: Self-pay | Admitting: Internal Medicine

## 2016-07-26 ENCOUNTER — Telehealth: Payer: Self-pay | Admitting: Internal Medicine

## 2016-07-26 DIAGNOSIS — I480 Paroxysmal atrial fibrillation: Secondary | ICD-10-CM

## 2016-07-26 NOTE — Telephone Encounter (Signed)
New message     Pt called stating that she would like to schedule the ablation. Please call.

## 2016-07-26 NOTE — Telephone Encounter (Signed)
Calling stating she would like to schedule the ablation.  Would like to do sometime in October because her insurance changes Nov 1 and needs to have done prior to that day.  Advised that Jacqueline [name] is not in office tomorrow and will forward message to her to call her to schedule.

## 2016-08-02 LAB — CUP PACEART REMOTE DEVICE CHECK: Date Time Interrogation Session: 20170721150732

## 2016-08-03 ENCOUNTER — Other Ambulatory Visit: Payer: Self-pay | Admitting: Cardiology

## 2016-08-03 NOTE — Telephone Encounter (Signed)
Spoke with patient and let her know will schedule the ablation for 10/17 at 7:30.  She will need a CT the week prior and will come in for H&P and labs on 09/19/16

## 2016-08-06 ENCOUNTER — Ambulatory Visit (INDEPENDENT_AMBULATORY_CARE_PROVIDER_SITE_OTHER): Payer: 59 | Admitting: *Deleted

## 2016-08-06 DIAGNOSIS — I480 Paroxysmal atrial fibrillation: Secondary | ICD-10-CM | POA: Diagnosis not present

## 2016-08-06 NOTE — Progress Notes (Signed)
Carelink Summary Report / Loop Report 

## 2016-08-27 ENCOUNTER — Encounter: Payer: Self-pay | Admitting: Internal Medicine

## 2016-08-27 ENCOUNTER — Telehealth: Payer: Self-pay | Admitting: *Deleted

## 2016-08-27 NOTE — Telephone Encounter (Signed)
[facility] requesting call back.  Gave device clinic phone number for return call.  Will determine if patient was symptomatic with pause episode on 08/25/16 at 1606 (appears post-termination).

## 2016-08-28 NOTE — Telephone Encounter (Signed)
Pt returning your call. Please call her back on Wednesday when you are back in the office.

## 2016-08-29 ENCOUNTER — Other Ambulatory Visit: Payer: Self-pay | Admitting: Cardiology

## 2016-08-30 ENCOUNTER — Encounter: Payer: Self-pay | Admitting: Internal Medicine

## 2016-08-30 ENCOUNTER — Telehealth: Payer: Self-pay | Admitting: Internal Medicine

## 2016-08-30 NOTE — Telephone Encounter (Signed)
Returned patient's call.  She does not recall any symptoms with pause episode on 09/04/16.  Advised patient to call our office if she has any pre-syncopal symptoms or syncope with episodes in the future.  Patient verbalizes understanding and appreciation.  She denies additional questions or concerns at this time.

## 2016-08-30 NOTE — Telephone Encounter (Signed)
Left message to return my call.  

## 2016-08-30 NOTE — Telephone Encounter (Signed)
New Message  Shelly from [facility] call requesting to speak with RN about pt labs results. I did reach out the medical records, and kim stated she needed a EF form. Shelly insisted on speaking with RN. Please call back to discuss

## 2016-09-03 ENCOUNTER — Encounter: Payer: Self-pay | Admitting: Internal Medicine

## 2016-09-03 LAB — CUP PACEART REMOTE DEVICE CHECK: Date Time Interrogation Session: 20170820150930

## 2016-09-03 NOTE — Progress Notes (Signed)
Carelink summary report received. Battery status OK. Normal device function. No new tachy or pause episodes. 1 symptom- ECG appears SR w/ PACs & PVCs. 2 brady- both previously addressed. 30 AF 4.3% +Xarelto +Cardizem. Monthly summary reports and ROV/PRN

## 2016-09-04 ENCOUNTER — Ambulatory Visit (INDEPENDENT_AMBULATORY_CARE_PROVIDER_SITE_OTHER): Payer: 59 | Admitting: *Deleted

## 2016-09-04 DIAGNOSIS — I480 Paroxysmal atrial fibrillation: Secondary | ICD-10-CM

## 2016-09-04 NOTE — Progress Notes (Signed)
Carelink Summary Report / Loop Recorder 

## 2016-09-05 ENCOUNTER — Encounter: Payer: Self-pay | Admitting: Internal Medicine

## 2016-09-19 ENCOUNTER — Encounter: Payer: Self-pay | Admitting: Internal Medicine

## 2016-09-19 ENCOUNTER — Other Ambulatory Visit: Payer: 59 | Admitting: *Deleted

## 2016-09-19 ENCOUNTER — Ambulatory Visit (INDEPENDENT_AMBULATORY_CARE_PROVIDER_SITE_OTHER): Payer: 59 | Admitting: Internal Medicine

## 2016-09-19 VITALS — BP 128/66 | HR 62 | Ht 66.5 in | Wt 205.6 lb

## 2016-09-19 DIAGNOSIS — I495 Sick sinus syndrome: Secondary | ICD-10-CM

## 2016-09-19 DIAGNOSIS — I10 Essential (primary) hypertension: Secondary | ICD-10-CM | POA: Diagnosis not present

## 2016-09-19 DIAGNOSIS — I480 Paroxysmal atrial fibrillation: Secondary | ICD-10-CM | POA: Diagnosis not present

## 2016-09-19 LAB — CBC WITH DIFFERENTIAL/PLATELET
Basophils Absolute: 0 {cells}/uL (ref 0–200)
Basophils Relative: 0 %
Eosinophils Absolute: 182 {cells}/uL (ref 15–500)
Eosinophils Relative: 2 %
HCT: 40.9 % (ref 35.0–45.0)
Hemoglobin: 14.1 g/dL (ref 11.7–15.5)
Lymphocytes Relative: 31 %
Lymphs Abs: 2821 {cells}/uL (ref 850–3900)
MCH: 30.7 pg (ref 27.0–33.0)
MCHC: 34.5 g/dL (ref 32.0–36.0)
MCV: 89.1 fL (ref 80.0–100.0)
MPV: 9.8 fL (ref 7.5–12.5)
Monocytes Absolute: 1001 {cells}/uL — ABNORMAL HIGH (ref 200–950)
Monocytes Relative: 11 %
Neutro Abs: 5096 {cells}/uL (ref 1500–7800)
Neutrophils Relative %: 56 %
Platelets: 246 10*3/uL (ref 140–400)
RBC: 4.59 MIL/uL (ref 3.80–5.10)
RDW: 13 % (ref 11.0–15.0)
WBC: 9.1 10*3/uL (ref 3.8–10.8)

## 2016-09-19 LAB — BASIC METABOLIC PANEL WITH GFR
BUN: 17 mg/dL (ref 7–25)
CO2: 29 mmol/L (ref 20–31)
Calcium: 9.8 mg/dL (ref 8.6–10.4)
Chloride: 101 mmol/L (ref 98–110)
Creat: 1.24 mg/dL — ABNORMAL HIGH (ref 0.50–0.99)
Glucose, Bld: 82 mg/dL (ref 65–99)
Potassium: 3.8 mmol/L (ref 3.5–5.3)
Sodium: 139 mmol/L (ref 135–146)

## 2016-09-19 NOTE — Patient Instructions (Addendum)
Medication Instructions:  Your physician recommends that you continue on your current medications as directed. Please refer to the Current Medication list given to you today.   Labwork: Your physician recommends that you return for lab work today   Testing/Procedures: Non-Cardiac CT Angiography (CTA), is a special type of CT scan that uses a computer to produce multi-dimensional views of major blood vessels throughout the body. In CT angiography, a contrast material is injected through an IV to help visualize the blood vessels---09/25/16    Your physician has recommended that you have an ablation. Catheter ablation is a medical procedure used to treat some cardiac arrhythmias (irregular heartbeats). During catheter ablation, a long, thin, flexible tube is put into a blood vessel in your groin (upper thigh), or neck. This tube is called an ablation catheter. It is then guided to your heart through the blood vessel. Radio frequency waves destroy small areas of heart tissue where abnormal heartbeats may cause an arrhythmia to start. Please see the instruction sheet given to you today.---10/02/16  Please arrive at [facility] at 5:30am Do not eat or drink after midnight the night prior to the procedure Do not take any medications the morning of the procedure Plan for one night stay     Follow-Up: Your physician recommends that you schedule a follow-up appointment  4 weeks from 10/02/16 with Roderic Palau, NP in [facility] and 3 months with Dr Rayann Heman   Any Other Special Instructions Will Be Listed Below (If Applicable).     If you need a refill on your cardiac medications before your next appointment, please call your pharmacy.

## 2016-09-20 NOTE — Progress Notes (Signed)
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  Unfortunately, afib has increased in frequency and duration.  Jacqueline [name] reports palpitations and fatigue.   Denies presyncope or syncope. Today, Jacqueline [name] denies symptoms of chest pain, shortness of breath,  lower extremity edema, dizziness, presyncope, or other concerns.    Past Medical History:  Diagnosis Date  . Dysrhythmia    Afib  . Gait abnormality   . GERD (gastroesophageal reflux disease)   . HTN (hypertension)   . Hyperlipidemia   . Palpitations   . Paroxysmal atrial fibrillation (HCC)   . Stress incontinence, female   . Typical atrial flutter [facility])    Past Surgical History:  Procedure Laterality Date  . ANTERIOR AND POSTERIOR VAGINAL REPAIR    . BURCH PROCEDURE    . ELECTROPHYSIOLOGIC STUDY N/A 12/22/2015   CTI and PVI ablation by Dr Rayann Heman  . EP IMPLANTABLE DEVICE N/A 08/11/2015   Procedure: Loop Recorder Insertion;  Surgeon: [name], MD;  Location: [place] [facility];  Service: Cardiovascular;  Laterality: N/A;  . VAGINAL HYSTERECTOMY      ROS- all systems are reviewed and negatives except as per HPI above  Current Outpatient Prescriptions  Medication Sig Dispense Refill  . acetaminophen (TYLENOL) 500 MG tablet Take 500 mg by mouth every 8 (eight) hours as needed for mild pain or moderate pain.    Marland Kitchen atorvastatin (LIPITOR) 20 MG tablet Take 10 mg by mouth daily.     . Calcium Carbonate (CALTRATE 600 PO) Take 600 mg by mouth daily.     . cetirizine (ZYRTEC) 10 MG tablet Take 10 mg by mouth daily.      . Cranberry 405 MG CAPS Take 405 mg by mouth daily.     Marland Kitchen diltiazem (CARDIZEM) 30 MG tablet Take 30 mg by mouth as directed. Reported on 01/23/2016    . fluticasone (FLONASE) 50 MCG/ACT nasal spray Place 1 spray into both nostrils daily as needed for allergies or rhinitis.    Marland Kitchen lisinopril-hydrochlorothiazide  (PRINZIDE,ZESTORETIC) 20-12.5 MG per tablet Take 1 tablet by mouth daily.    . Multiple Vitamins-Minerals (CENTRUM PO) Take 1 tablet by mouth daily.      . pantoprazole (PROTONIX) 40 MG tablet Take 1 tablet by mouth daily. 1    . pramipexole (MIRAPEX) 0.125 MG tablet Take 0.25 mg by mouth every evening. 3 hours before bedtime  2  . XARELTO 20 MG TABS tablet TAKE 1 TABLET BY MOUTH EVERY DAY WITH SUPPER 30 tablet 5   No current facility-administered medications for this visit.     Physical Exam: Vitals:   09/19/16 1217  BP: 128/66  Pulse: 62  Weight: 205 lb 9.6 oz (93.3 kg)  Height: 5' 6.5" (1.689 m)    GEN- The patient is well appearing, alert and oriented x 3 today.   Head- normocephalic, atraumatic Eyes-  Sclera clear, conjunctiva pink Ears- hearing intact Oropharynx- clear Lungs- Clear to ausculation bilaterally, normal work of breathing Heart- Regular rate and rhythm with frequent ectopy, no murmurs, rubs or gallops, PMI not laterally displaced GI- soft, NT, ND, + BS Extremities- no clubbing, cyanosis, or edema  ILR interrogation reviewed  Assessment and Plan:  1. Paroxysmal atrial fibrillation Recurrence of afib despite medical therapy with flecainide.  ILR reveals afib burden of 5%. Therapeutic strategies for afib including medicine and ablation were discussed in detail with the patient today. Risk,  benefits, and alternatives to EP study and radiofrequency ablation for afib were also discussed in detail today. These risks include but are not limited to stroke, bleeding, vascular damage, tamponade, perforation, damage to the esophagus, lungs, and other structures, pulmonary vein stenosis, worsening renal function, and death. The patient understands these risk and wishes to proceed.  We will therefore proceed with catheter ablation at the next available time.  Will obtain cardiac CT prior to ablation to evaluate LA anatomy and to exclude LAA thrombus.  Continue xarelto.  2.  Sinus brady Post termination pauses resolved.  Some asymptomatic nocturnal bradycardia is noted No changes  3. HTN Stable No change required today    [name] MD, [facility] 09/20/2016 11:10 PM

## 2016-09-24 ENCOUNTER — Telehealth (HOSPITAL_COMMUNITY): Payer: Self-pay | Admitting: *Deleted

## 2016-09-24 NOTE — Telephone Encounter (Signed)
Cld pt to sched appt for 4 week f/u after 10/17 ablation.  Lft vcml

## 2016-09-25 ENCOUNTER — Encounter (HOSPITAL_COMMUNITY): Payer: Self-pay

## 2016-09-25 ENCOUNTER — Ambulatory Visit (HOSPITAL_COMMUNITY)
Admission: RE | Admit: 2016-09-25 | Discharge: 2016-09-25 | Disposition: A | Discharge status: Home / Self Care | Payer: 59 | Source: Ambulatory Visit | Attending: Internal Medicine | Admitting: Internal Medicine

## 2016-09-25 DIAGNOSIS — R911 Solitary pulmonary nodule: Secondary | ICD-10-CM | POA: Insufficient documentation

## 2016-09-25 DIAGNOSIS — K449 Diaphragmatic hernia without obstruction or gangrene: Secondary | ICD-10-CM | POA: Insufficient documentation

## 2016-09-25 DIAGNOSIS — I480 Paroxysmal atrial fibrillation: Secondary | ICD-10-CM | POA: Diagnosis present

## 2016-09-25 IMAGING — CT CT HEART MORPH/PULM VEIN W/ CM & W/O CA SCORE
1 of 10 series · 1 of 20 positions shown, 2 images · non-contrast
Comparison: Cardiac CTA [DATE].

CLINICAL DATA: Pre Ablation

EXAM:
Cardiac CTA
MEDICATIONS:
None
TECHNIQUE: The patient was scanned on a [employer] [REDACTED]ice scanner. Gantry
rotation speed was 270 msecs. Collimation was .9mm. A 100 kV
prospective scan was triggered in the descending thoracic aorta at
111 HU's with 5% padding centered around 78% of the R-R interval.
Average HR during the scan was bpm. The 3D data set was interpreted
on a dedicated work station using MPR, MIP and VRT modes. A total of
80cc of contrast was used.

[Series 300: locator · axial · 0.35mm/px · z∈[-126,-126]mm · 1 of 1 slices shown, 2 images]
[im 1/1  vessel]
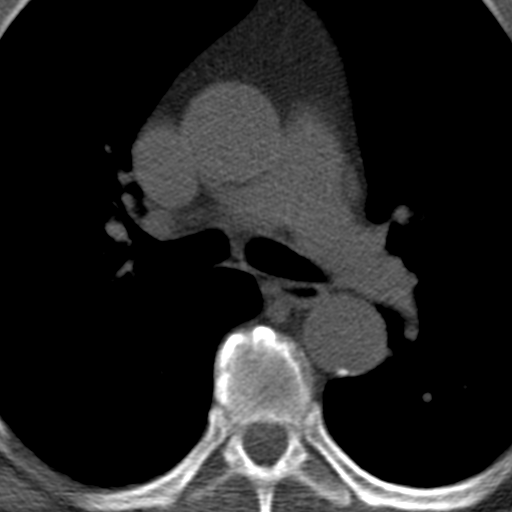
[im 1/1  lung]
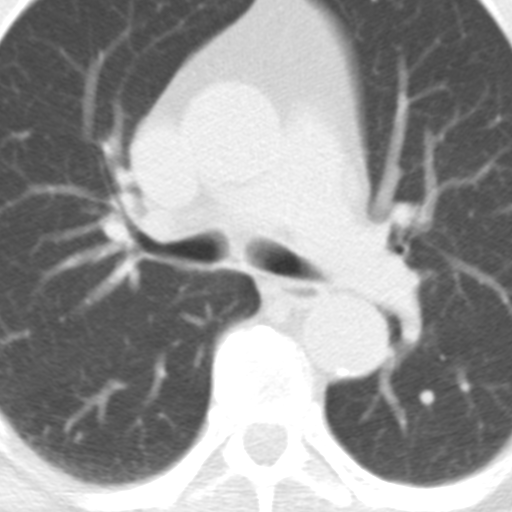

[1 of 20 positions shown; findings below may reference images not displayed]

FINDINGS: Non-cardiac: See separate report from [REDACTED]. LLL lung
nodule

There was mild biatrial enlargement. The RV appeared mildly
enlarged. There was no ASD/VSD. There was no pericardial effusion.
The esophagus coursed closest to the LLPV

There was a small hiatal hernia. The pulmonary veins drained
normally into the LA with no anomaly. The right sided veins were
larger. There was no DIKE thrombus

LLPV:  Ostium 12 mm Area.9 cm2

LUPV:  Ostium 13 mm Area.9 cm2

RUPV:  Ostium 16 mm Area 2.2 cm2

RLPV Ostium 17 mm Area 2.8 cm2
IMPRESSION: 1) No anomalous pulmonary veins right sided veins large than left
see measurements above

2) No DIKE thrombus

3) Esophagus courses closest to LLPV small hiatal hernia

4) Mild biatrial enlargement

5) No pericardial effusion

DIKE

EXAM:
OVER-READ INTERPRETATION  CT CHEST

The following report is an over-read performed by radiologist Dr.
over-read does not include interpretation of cardiac or coronary
anatomy or pathology. The coronary calcium score/coronary CTA
interpretation by the cardiologist is attached.
FINDINGS: 4 mm subpleural nodule in the anterior aspect of the left lower lobe
abutting the major fissure (image 47 of series 204). Within the
visualized portions of the thorax there are no other larger
suspicious appearing pulmonary nodules or masses, there is no acute
consolidative airspace disease, no pleural effusions, no
pneumothorax and no lymphadenopathy. Small hiatal hernia. Scattered
areas of linear scarring in the lung bases bilaterally. Visualized
portions of the upper abdomen are unremarkable. No aggressive
appearing lytic or blastic lesions are noted in the visualized
portions of the skeleton. Implanted device in the subcutaneous fat
of the medial aspect of the left breast.
IMPRESSION: 1. 4 mm subpleural nodule in the anterior aspect of the left lower
lobe is highly nonspecific, and statistically likely a benign
subpleural lymph node. No follow-up needed if patient is low-risk.
Non-contrast chest CT can be considered in 12 months if patient is
high-risk. This recommendation follows the consensus statement:
Guidelines for Management of Incidental Pulmonary Nodules Detected
2. Small hiatal hernia.

## 2016-09-25 MED ORDER — IOPAMIDOL (ISOVUE-370) INJECTION 76%
INTRAVENOUS | Status: AC
  Administered 2016-09-25: 100 mL
  Filled 2016-09-25: qty 100

## 2016-09-25 NOTE — Progress Notes (Signed)
Pt discharged after CT heart scan, pt on xarelto so extra pressure held upon IV removal

## 2016-09-29 LAB — CUP PACEART REMOTE DEVICE CHECK: Date Time Interrogation Session: 20170919153718

## 2016-09-29 NOTE — Progress Notes (Signed)
Carelink summary report received. Battery status OK. Normal device function. 1 symptom- appears AF w/ RVR and SVR at times, previously addressed. 1 tachy- appears irreg. VS, 1 pause- previously addressed, 2 brady-1 w/ ECG, previously addressed, 37 AF 5.2% 2 w/ ECGs, 1 appears SR w/ PACs, 1 appears AF. Pt has upcoming AF ablation scheduled on 10/17. +Xarelto. Monthly summary reports and ROV/PRN

## 2016-10-02 ENCOUNTER — Encounter (HOSPITAL_COMMUNITY)
Admission: RE | Disposition: A | Discharge status: Home / Self Care | Payer: Self-pay | Source: Ambulatory Visit | Attending: Internal Medicine

## 2016-10-02 ENCOUNTER — Encounter (HOSPITAL_COMMUNITY): Payer: Self-pay | Admitting: Certified Registered"

## 2016-10-02 ENCOUNTER — Ambulatory Visit (HOSPITAL_COMMUNITY): Payer: 59 | Admitting: Certified Registered"

## 2016-10-02 ENCOUNTER — Ambulatory Visit (HOSPITAL_COMMUNITY)
Admission: RE | Admit: 2016-10-02 | Discharge: 2016-10-03 | Disposition: A | Discharge status: Home / Self Care | Payer: 59 | Source: Ambulatory Visit | Attending: Internal Medicine | Admitting: Internal Medicine

## 2016-10-02 DIAGNOSIS — I483 Typical atrial flutter: Secondary | ICD-10-CM | POA: Insufficient documentation

## 2016-10-02 DIAGNOSIS — K219 Gastro-esophageal reflux disease without esophagitis: Secondary | ICD-10-CM | POA: Insufficient documentation

## 2016-10-02 DIAGNOSIS — Z7901 Long term (current) use of anticoagulants: Secondary | ICD-10-CM | POA: Insufficient documentation

## 2016-10-02 DIAGNOSIS — I10 Essential (primary) hypertension: Secondary | ICD-10-CM | POA: Insufficient documentation

## 2016-10-02 DIAGNOSIS — E785 Hyperlipidemia, unspecified: Secondary | ICD-10-CM | POA: Diagnosis not present

## 2016-10-02 DIAGNOSIS — R269 Unspecified abnormalities of gait and mobility: Secondary | ICD-10-CM | POA: Diagnosis not present

## 2016-10-02 DIAGNOSIS — I480 Paroxysmal atrial fibrillation: Secondary | ICD-10-CM | POA: Insufficient documentation

## 2016-10-02 DIAGNOSIS — I4891 Unspecified atrial fibrillation: Secondary | ICD-10-CM | POA: Diagnosis present

## 2016-10-02 HISTORY — PX: ELECTROPHYSIOLOGIC STUDY: SHX172A

## 2016-10-02 LAB — GLUCOSE, CAPILLARY
Glucose-Capillary: 140 mg/dL — ABNORMAL HIGH (ref 65–99)
Glucose-Capillary: 164 mg/dL — ABNORMAL HIGH (ref 65–99)
Glucose-Capillary: 160 mg/dL — ABNORMAL HIGH (ref 65–99)

## 2016-10-02 LAB — MRSA PCR SCREENING: MRSA by PCR: NEGATIVE

## 2016-10-02 LAB — POCT ACTIVATED CLOTTING TIME
Activated Clotting Time: 312 s
Activated Clotting Time: 312 s
Activated Clotting Time: 158 seconds

## 2016-10-02 SURGERY — ATRIAL FIBRILLATION ABLATION
Anesthesia: General

## 2016-10-02 MED ORDER — HYDROCODONE-ACETAMINOPHEN 5-325 MG PO TABS: 1.0000 | ORAL_TABLET | ORAL | Status: DC | PRN

## 2016-10-02 MED ORDER — INFLUENZA VAC SPLIT QUAD 0.5 ML IM SUSY
0.5000 mL | PREFILLED_SYRINGE | INTRAMUSCULAR | Status: AC
  Administered 2016-10-03: 0.5 mL via INTRAMUSCULAR
  Filled 2016-10-02: qty 0.5

## 2016-10-02 MED ORDER — ONDANSETRON HCL 4 MG/2ML IJ SOLN
4.0000 mg | Freq: Four times a day (QID) | INTRAMUSCULAR | Status: DC | PRN
  Administered 2016-10-02: 4 mg via INTRAVENOUS
  Filled 2016-10-02: qty 2

## 2016-10-02 MED ORDER — RIVAROXABAN 20 MG PO TABS
20.0000 mg | ORAL_TABLET | Freq: Every day | ORAL | Status: DC
  Administered 2016-10-02: 20 mg via ORAL
  Filled 2016-10-02: qty 1

## 2016-10-02 MED ORDER — PROTAMINE SULFATE 10 MG/ML IV SOLN
INTRAVENOUS | Status: DC | PRN
  Administered 2016-10-02: 30 mg via INTRAVENOUS

## 2016-10-02 MED ORDER — LISINOPRIL-HYDROCHLOROTHIAZIDE 20-12.5 MG PO TABS: 1.0000 | ORAL_TABLET | Freq: Every day | ORAL | Status: DC

## 2016-10-02 MED ORDER — HYDROCHLOROTHIAZIDE 12.5 MG PO CAPS: 12.5000 mg | ORAL_CAPSULE | Freq: Every day | ORAL | Status: DC

## 2016-10-02 MED ORDER — BUPIVACAINE HCL (PF) 0.25 % IJ SOLN
INTRAMUSCULAR | Status: AC
  Filled 2016-10-02: qty 30

## 2016-10-02 MED ORDER — LIDOCAINE 2% (20 MG/ML) 5 ML SYRINGE
INTRAMUSCULAR | Status: DC | PRN
  Administered 2016-10-02: 50 mg via INTRAVENOUS

## 2016-10-02 MED ORDER — ADENOSINE 6 MG/2ML IV SOLN
INTRAVENOUS | Status: DC | PRN
  Administered 2016-10-02 (×2): 12 mg via INTRAVENOUS

## 2016-10-02 MED ORDER — ADENOSINE 6 MG/2ML IV SOLN
INTRAVENOUS | Status: AC
  Filled 2016-10-02: qty 4

## 2016-10-02 MED ORDER — PROPOFOL 10 MG/ML IV BOLUS
INTRAVENOUS | Status: DC | PRN
  Administered 2016-10-02: 190 mg via INTRAVENOUS

## 2016-10-02 MED ORDER — MIDAZOLAM HCL 5 MG/5ML IJ SOLN
INTRAMUSCULAR | Status: DC | PRN
  Administered 2016-10-02: 2 mg via INTRAVENOUS

## 2016-10-02 MED ORDER — OXYCODONE HCL 5 MG/5ML PO SOLN: 5.0000 mg | Freq: Once | ORAL | Status: DC | PRN

## 2016-10-02 MED ORDER — HYDROMORPHONE HCL 2 MG/ML IJ SOLN: 0.3000 mg | INTRAMUSCULAR | Status: DC | PRN

## 2016-10-02 MED ORDER — ISOPROTERENOL HCL 0.2 MG/ML IJ SOLN
INTRAMUSCULAR | Status: AC
Start: 2016-10-02 — End: 2016-10-02
  Filled 2016-10-02: qty 5

## 2016-10-02 MED ORDER — IOPAMIDOL (ISOVUE-370) INJECTION 76%
INTRAVENOUS | Status: AC
  Filled 2016-10-02: qty 50

## 2016-10-02 MED ORDER — OXYCODONE HCL 5 MG PO TABS: 5.0000 mg | ORAL_TABLET | Freq: Once | ORAL | Status: DC | PRN

## 2016-10-02 MED ORDER — BUPIVACAINE HCL (PF) 0.25 % IJ SOLN
INTRAMUSCULAR | Status: DC | PRN
  Administered 2016-10-02: 30 mL

## 2016-10-02 MED ORDER — DEXAMETHASONE SODIUM PHOSPHATE 10 MG/ML IJ SOLN
INTRAMUSCULAR | Status: DC | PRN
  Administered 2016-10-02: 4 mg via INTRAVENOUS

## 2016-10-02 MED ORDER — ISOPROTERENOL HCL 0.2 MG/ML IJ SOLN
INTRAMUSCULAR | Status: DC | PRN
  Administered 2016-10-02: 10 ug/min via INTRAVENOUS

## 2016-10-02 MED ORDER — SODIUM CHLORIDE 0.9% FLUSH
3.0000 mL | Freq: Two times a day (BID) | INTRAVENOUS | Status: DC
  Administered 2016-10-02 (×2): 3 mL via INTRAVENOUS

## 2016-10-02 MED ORDER — HEPARIN SODIUM (PORCINE) 1000 UNIT/ML IJ SOLN
INTRAMUSCULAR | Status: DC | PRN
  Administered 2016-10-02: 1000 [IU] via INTRAVENOUS

## 2016-10-02 MED ORDER — ONDANSETRON HCL 4 MG/2ML IJ SOLN
4.0000 mg | Freq: Once | INTRAMUSCULAR | Status: DC | PRN
Start: 2016-10-02 — End: 2016-10-03

## 2016-10-02 MED ORDER — ONDANSETRON HCL 4 MG/2ML IJ SOLN
INTRAMUSCULAR | Status: DC | PRN
  Administered 2016-10-02: 4 mg via INTRAVENOUS

## 2016-10-02 MED ORDER — IOPAMIDOL (ISOVUE-370) INJECTION 76%
INTRAVENOUS | Status: DC | PRN
  Administered 2016-10-02: 5 mL via INTRAVENOUS

## 2016-10-02 MED ORDER — SODIUM CHLORIDE 0.9 % IV SOLN: 250.0000 mL | INTRAVENOUS | Status: DC | PRN

## 2016-10-02 MED ORDER — HEPARIN SODIUM (PORCINE) 1000 UNIT/ML IJ SOLN
INTRAMUSCULAR | Status: DC | PRN
  Administered 2016-10-02: 2000 [IU] via INTRAVENOUS
  Administered 2016-10-02: 12000 [IU] via INTRAVENOUS
  Administered 2016-10-02: 2000 [IU] via INTRAVENOUS

## 2016-10-02 MED ORDER — SUCCINYLCHOLINE CHLORIDE 20 MG/ML IJ SOLN
INTRAMUSCULAR | Status: DC | PRN
  Administered 2016-10-02: 120 mg via INTRAVENOUS

## 2016-10-02 MED ORDER — PRAMIPEXOLE DIHYDROCHLORIDE 0.25 MG PO TABS
0.2500 mg | ORAL_TABLET | Freq: Every evening | ORAL | Status: DC
  Administered 2016-10-02: 0.25 mg via ORAL
  Filled 2016-10-02: qty 1

## 2016-10-02 MED ORDER — LISINOPRIL 20 MG PO TABS
20.0000 mg | ORAL_TABLET | Freq: Every day | ORAL | Status: DC
  Filled 2016-10-02: qty 1

## 2016-10-02 MED ORDER — ACETAMINOPHEN 325 MG PO TABS: 650.0000 mg | ORAL_TABLET | ORAL | Status: DC | PRN

## 2016-10-02 MED ORDER — HEPARIN SODIUM (PORCINE) 1000 UNIT/ML IJ SOLN
INTRAMUSCULAR | Status: AC
  Filled 2016-10-02: qty 1

## 2016-10-02 MED ORDER — PANTOPRAZOLE SODIUM 40 MG PO TBEC
40.0000 mg | DELAYED_RELEASE_TABLET | Freq: Every day | ORAL | Status: DC
Start: 2016-10-02 — End: 2016-10-03
  Administered 2016-10-02 – 2016-10-03 (×2): 40 mg via ORAL
  Filled 2016-10-02 (×2): qty 1

## 2016-10-02 MED ORDER — SODIUM CHLORIDE 0.9% FLUSH: 3.0000 mL | INTRAVENOUS | Status: DC | PRN

## 2016-10-02 MED ORDER — FENTANYL CITRATE (PF) 100 MCG/2ML IJ SOLN
INTRAMUSCULAR | Status: DC | PRN
  Administered 2016-10-02: 25 ug via INTRAVENOUS
  Administered 2016-10-02: 50 ug via INTRAVENOUS
  Administered 2016-10-02: 25 ug via INTRAVENOUS
  Administered 2016-10-02 (×2): 50 ug via INTRAVENOUS

## 2016-10-02 MED ORDER — SODIUM CHLORIDE 0.9 % IV SOLN
INTRAVENOUS | Status: DC
Start: 2016-10-02 — End: 2016-10-02
  Administered 2016-10-02: 07:00:00 via INTRAVENOUS

## 2016-10-02 MED ORDER — FENTANYL CITRATE (PF) 100 MCG/2ML IJ SOLN: 25.0000 ug | INTRAMUSCULAR | Status: DC | PRN

## 2016-10-02 MED ORDER — ONDANSETRON HCL 4 MG/2ML IJ SOLN: 4.0000 mg | Freq: Once | INTRAMUSCULAR | Status: DC | PRN

## 2016-10-02 SURGICAL SUPPLY — 17 items
BAG SNAP BAND KOVER 36X36 (MISCELLANEOUS) ×2 IMPLANT
BLANKET WARM UNDERBOD FULL ACC (MISCELLANEOUS) ×2 IMPLANT
CATH NAVISTAR SMARTTOUCH DF (ABLATOR) ×1 IMPLANT
CATH SOUNDSTAR 3D IMAGING (CATHETERS) ×1 IMPLANT
CATH VARIABLE LASSO NAV 2515 (CATHETERS) ×2 IMPLANT
CATH WEBSTER BI DIR CS D-F CRV (CATHETERS) ×1 IMPLANT
NEEDLE TRANSEP BRK 71CM 407200 (NEEDLE) ×2 IMPLANT
PACK EP LATEX FREE (CUSTOM PROCEDURE TRAY) ×2
PACK EP LF (CUSTOM PROCEDURE TRAY) ×1 IMPLANT
PAD DEFIB LIFELINK (PAD) ×2 IMPLANT
PATCH CARTO3 (PAD) ×1 IMPLANT
SHEATH AVANTI 11F 11CM (SHEATH) ×2 IMPLANT
SHEATH PINNACLE 7F 10CM (SHEATH) ×2 IMPLANT
SHEATH PINNACLE 9F 10CM (SHEATH) ×2 IMPLANT
SHEATH SWARTZ TS SL2 63CM 8.5F (SHEATH) ×1 IMPLANT
SHIELD RADPAD SCOOP 12X17 (MISCELLANEOUS) ×2 IMPLANT
TUBING SMART ABLATE COOLFLOW (TUBING) ×2 IMPLANT

## 2016-10-02 NOTE — Transfer of Care (Signed)
Immediate Anesthesia Transfer of Care Note  Patient: Jacqueline [name]  Procedure(s) Performed: Procedure(s): Atrial Fibrillation Ablation (N/A)  Patient Location: Cath Lab  Anesthesia Type:General  Level of Consciousness: awake, oriented and patient cooperative  Airway & Oxygen Therapy: Patient Spontanous Breathing and Patient connected to nasal cannula oxygen  Post-op Assessment: Report given to RN, Post -op Vital signs reviewed and stable and Patient moving all extremities  Post vital signs: Reviewed and stable  Last Vitals:  Vitals:   10/02/16 0551 10/02/16 1014  BP: 119/74 (!) 112/44  Pulse: (!) 52 (!) 58  Resp: 18 15  Temp: 36.6 C 36.1 C    Last Pain:  Vitals:   10/02/16 1014  TempSrc: Tympanic      Patients Stated Pain Goal: 3 ([id number])  Complications: No apparent anesthesia complications

## 2016-10-02 NOTE — Progress Notes (Signed)
Site area: Right groin a 7, 9 , 11 venous sheath was removed  Site Prior to Removal:  Level 0  Pressure Applied For 15 MINUTES    Bedrest Beginning at 1110a  Manual:   Yes.    Patient Status During Pull:  stable  Post Pull Groin Site:  Level 0  Post Pull Instructions Given:  Yes.    Post Pull Pulses Present:  Yes.    Dressing Applied:  Yes.    Comments:  VS remain stable during sheath pull.

## 2016-10-02 NOTE — H&P (View-Only) (Signed)
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  Unfortunately, afib has increased in frequency and duration.  She reports palpitations and fatigue.   Denies presyncope or syncope. Today, she denies symptoms of chest pain, shortness of breath,  lower extremity edema, dizziness, presyncope, or other concerns.    Past Medical History:  Diagnosis Date  . Dysrhythmia    Afib  . Gait abnormality   . GERD (gastroesophageal reflux disease)   . HTN (hypertension)   . Hyperlipidemia   . Palpitations   . Paroxysmal atrial fibrillation (HCC)   . Stress incontinence, female   . Typical atrial flutter [facility])    Past Surgical History:  Procedure Laterality Date  . ANTERIOR AND POSTERIOR VAGINAL REPAIR    . BURCH PROCEDURE    . ELECTROPHYSIOLOGIC STUDY N/A 12/22/2015   CTI and PVI ablation by Dr Rayann Heman  . EP IMPLANTABLE DEVICE N/A 08/11/2015   Procedure: Loop Recorder Insertion;  Surgeon: [name], MD;  Location: [place] [facility];  Service: Cardiovascular;  Laterality: N/A;  . VAGINAL HYSTERECTOMY      ROS- all systems are reviewed and negatives except as per HPI above  Current Outpatient Prescriptions  Medication Sig Dispense Refill  . acetaminophen (TYLENOL) 500 MG tablet Take 500 mg by mouth every 8 (eight) hours as needed for mild pain or moderate pain.    Marland Kitchen atorvastatin (LIPITOR) 20 MG tablet Take 10 mg by mouth daily.     . Calcium Carbonate (CALTRATE 600 PO) Take 600 mg by mouth daily.     . cetirizine (ZYRTEC) 10 MG tablet Take 10 mg by mouth daily.      . Cranberry 405 MG CAPS Take 405 mg by mouth daily.     Marland Kitchen diltiazem (CARDIZEM) 30 MG tablet Take 30 mg by mouth as directed. Reported on 01/23/2016    . fluticasone (FLONASE) 50 MCG/ACT nasal spray Place 1 spray into both nostrils daily as needed for allergies or rhinitis.    Marland Kitchen lisinopril-hydrochlorothiazide  (PRINZIDE,ZESTORETIC) 20-12.5 MG per tablet Take 1 tablet by mouth daily.    . Multiple Vitamins-Minerals (CENTRUM PO) Take 1 tablet by mouth daily.      . pantoprazole (PROTONIX) 40 MG tablet Take 1 tablet by mouth daily. 1    . pramipexole (MIRAPEX) 0.125 MG tablet Take 0.25 mg by mouth every evening. 3 hours before bedtime  2  . XARELTO 20 MG TABS tablet TAKE 1 TABLET BY MOUTH EVERY DAY WITH SUPPER 30 tablet 5   No current facility-administered medications for this visit.     Physical Exam: Vitals:   09/19/16 1217  BP: 128/66  Pulse: 62  Weight: 205 lb 9.6 oz (93.3 kg)  Height: 5' 6.5" (1.689 m)    GEN- The patient is well appearing, alert and oriented x 3 today.   Head- normocephalic, atraumatic Eyes-  Sclera clear, conjunctiva pink Ears- hearing intact Oropharynx- clear Lungs- Clear to ausculation bilaterally, normal work of breathing Heart- Regular rate and rhythm with frequent ectopy, no murmurs, rubs or gallops, PMI not laterally displaced GI- soft, NT, ND, + BS Extremities- no clubbing, cyanosis, or edema  ILR interrogation reviewed  Assessment and Plan:  1. Paroxysmal atrial fibrillation Recurrence of afib despite medical therapy with flecainide.  ILR reveals afib burden of 5%. Therapeutic strategies for afib including medicine and ablation were discussed in detail with the patient today. Risk,  benefits, and alternatives to EP study and radiofrequency ablation for afib were also discussed in detail today. These risks include but are not limited to stroke, bleeding, vascular damage, tamponade, perforation, damage to the esophagus, lungs, and other structures, pulmonary vein stenosis, worsening renal function, and death. The patient understands these risk and wishes to proceed.  We will therefore proceed with catheter ablation at the next available time.  Will obtain cardiac CT prior to ablation to evaluate LA anatomy and to exclude LAA thrombus.  Continue xarelto.  2.  Sinus brady Post termination pauses resolved.  Some asymptomatic nocturnal bradycardia is noted No changes  3. HTN Stable No change required today    [name] MD, [facility] 09/20/2016 11:10 PM

## 2016-10-02 NOTE — Interval H&P Note (Signed)
History and Physical Interval Note:  10/02/2016 7:25 AM  Jacqueline [name]  has presented today for surgery, with the diagnosis of a-fib  The various methods of treatment have been discussed with the patient and family. After consideration of risks, benefits and other options for treatment, the patient has consented to  Procedure(s): Atrial Fibrillation Ablation (N/A) as a surgical intervention .  The patient's history has been reviewed, patient examined, no change in status, stable for surgery.  I have reviewed the patient's chart and labs.  Questions were answered to the patient's satisfaction.     [name]

## 2016-10-02 NOTE — Anesthesia Preprocedure Evaluation (Addendum)
Anesthesia Evaluation  Patient identified by MRN, date of birth, ID band Patient awake    Reviewed: Allergy & Precautions, NPO status , Patient's Chart, lab work & pertinent test results  Airway Mallampati: II  TM Distance: >3 FB Neck ROM: Full    Dental  (+) Teeth Intact, Dental Advisory Given   Pulmonary    breath sounds clear to auscultation       Cardiovascular hypertension,  Rhythm:Regular Rate:Normal     Neuro/Psych    GI/Hepatic   Endo/Other    Renal/GU      Musculoskeletal   Abdominal   Peds  Hematology   Anesthesia Other Findings   Reproductive/Obstetrics                             Anesthesia Physical Anesthesia Plan  ASA: III  Anesthesia Plan: General   Post-op Pain Management:    Induction: Intravenous  Airway Management Planned: LMA  Additional Equipment:   Intra-op Plan:   Post-operative Plan:   Informed Consent: I have reviewed the patients History and Physical, chart, labs and discussed the procedure including the risks, benefits and alternatives for the proposed anesthesia with the patient or authorized representative who has indicated his/her understanding and acceptance.   Dental advisory given  Plan Discussed with: CRNA and Anesthesiologist  Anesthesia Plan Comments:         Anesthesia Quick Evaluation  

## 2016-10-02 NOTE — Discharge Summary (Signed)
ELECTROPHYSIOLOGY PROCEDURE DISCHARGE SUMMARY    Patient ID: Jacqueline [name],  MRN: [id number], DOB/AGE: Apr 29, 1951 65 y.o.  Admit date: 10/02/2016 Discharge date: 10/03/2016  Primary Care Physician: Horatio Pel, MD Primary Cardiologist: Meda Coffee Electrophysiologist: [name], MD  Primary Discharge Diagnosis:  Paroxysmal atrial fibrillation status post ablation this admission  Secondary Discharge Diagnosis:  1.  Hypertension 2.  Hyperlipidemia 3.  Typical atrial flutter s/p CTI  Procedures This Admission:  1.  Electrophysiology study and radiofrequency catheter ablation on 10/02/16 by Dr [name].  This study demonstrated sinus rhythm upon presentation; return of electrical conduction within the right superior pulmonary vein which was the culprit for this patients atrial fibrillation.  The RIPV, LSPV, and LIPV were quiescent from a prior ablation procedure; successful electrical isolation and anatomical encircling of the RSPV with RF; no inducible arrhythmias following ablation both on and off of Isuprel.  Adenosine was also administered.  The patient was observed for over 20 minutes without return of conduction in the RSPV; CTI block persists from a prior ablation procedure; no early apparent complications..    Brief HPI: Jacqueline [name] is a 65 y.o. female with a history of paroxysmal atrial fibrillation.  They have failed medical therapy with Flecainide.  She did well initially post ablation but has had increasing AF recently by symptoms and ILR. Risks, benefits, and alternatives to catheter ablation of atrial fibrillation were reviewed with the patient who wished to proceed.  The patient underwent cardiac CT prior to the procedure which demonstrated no LAA thrombus.    Hospital Course:  The patient was admitted and underwent EPS/RFCA of atrial fibrillation with details as outlined above.  They were monitored on telemetry overnight which demonstrated sinus bradycardia.   Groin was without complication on the day of discharge.  The patient was examined and considered to be stable for discharge.  Wound care and restrictions were reviewed with the patient.  The patient will be seen back by Roderic Palau, NP in 4 weeks and Dr Rayann Heman in 12 weeks for post ablation follow up.   This patients CHA2DS2-VASc Score and unadjusted Ischemic Stroke Rate (% per year) is equal to 3.2 % stroke rate/year from a score of 3 Above score calculated as 1 point each if present [CHF, HTN, DM, Vascular=MI/PAD/Aortic Plaque, Age if 65-74, or Female] Above score calculated as 2 points each if present [Age > 75, or Stroke/TIA/TE]   Physical Exam: Vitals:   10/03/16 0300 10/03/16 0400 10/03/16 0500 10/03/16 0600  BP:      Pulse: (!) 46 (!) 49 (!) 49 (!) 49  Resp: 13 16 12  (!) 25  Temp:      TempSrc: (P) Oral     SpO2: 94% 93% 93% 93%  Weight:      Height:        GEN- The patient is well appearing, alert and oriented x 3 today.   HEENT: normocephalic, atraumatic; sclera clear, conjunctiva pink; hearing intact; oropharynx clear; neck supple  Lungs- Clear to ausculation bilaterally, normal work of breathing.  No wheezes, rales, rhonchi Heart- Regular rate and rhythm, no murmurs, rubs or gallops  GI- soft, non-tender, non-distended, bowel sounds present  Extremities- no clubbing, cyanosis, or edema; DP/PT/radial pulses 2+ bilaterally, groin without hematoma/bruit MS- no significant deformity or atrophy Skin- warm and dry, no rash or lesion Psych- euthymic mood, full affect Neuro- strength and sensation are intact   Labs:   Lab Results  Component Value Date   WBC  9.1 09/19/2016   HGB 14.1 09/19/2016   HCT 40.9 09/19/2016   MCV 89.1 09/19/2016   PLT 246 09/19/2016   No results for input(s): NA, K, CL, CO2, BUN, CREATININE, CALCIUM, PROT, BILITOT, ALKPHOS, ALT, AST, GLUCOSE in the last 168 hours.  Invalid input(s): LABALBU   Discharge Medications:    Medication List      TAKE these medications   acetaminophen 500 MG tablet Commonly known as:  TYLENOL Take 500 mg by mouth every 8 (eight) hours as needed for mild pain or moderate pain.   atorvastatin 20 MG tablet Commonly known as:  LIPITOR Take 10 mg by mouth daily.   CALTRATE 600 PO Take 600 mg by mouth daily.   CENTRUM PO Take 1 tablet by mouth daily.   cetirizine 10 MG tablet Commonly known as:  ZYRTEC Take 10 mg by mouth daily.   Cranberry 405 MG Caps Take 405 mg by mouth daily.   diltiazem 30 MG tablet Commonly known as:  CARDIZEM Take 30 mg by mouth as needed.   fluticasone 50 MCG/ACT nasal spray Commonly known as:  FLONASE Place 1 spray into both nostrils daily as needed for allergies or rhinitis.   lisinopril-hydrochlorothiazide 20-12.5 MG tablet Commonly known as:  PRINZIDE,ZESTORETIC Take 1 tablet by mouth daily.   pantoprazole 40 MG tablet Commonly known as:  PROTONIX Take 1 tablet by mouth daily. 1   pramipexole 0.125 MG tablet Commonly known as:  MIRAPEX Take 0.25 mg by mouth every evening. 3 hours before bedtime   PROBIOTIC PO Take 1 capsule by mouth daily.   XARELTO 20 MG Tabs tablet Generic drug:  rivaroxaban TAKE 1 TABLET BY MOUTH EVERY DAY WITH SUPPER       Disposition:  Discharge Instructions    Diet - low sodium heart healthy    Complete by:  As directed    Increase activity slowly    Complete by:  As directed      Follow-up Information    [facility] Follow up on 11/02/2016.   Specialty:  Cardiology Why:  at 8:30AM Contact information: [address] [id number] [place] [id number] [place], MD Follow up on 01/02/2017.   Specialty:  Cardiology Why:  at 9:45AM Contact information: [place] [address] [phone]           Duration of Discharge Encounter: Greater than 30 minutes including physician time.  Signed, Chanetta Marshall,  NP 10/03/2016 7:27 AM   I have seen, examined the patient, and reviewed the above assessment and plan.  On exam, RRR.  Changes to above are made where necessary.  Resume home medicines.  Follow-up with Butch Penny in the 4 weeks.  Co Sign: [name], MD 10/03/2016 9:31 AM

## 2016-10-02 NOTE — Discharge Instructions (Signed)
You have an appointment set up with the [facility].  Multiple studies have shown that being followed by a dedicated atrial fibrillation clinic in addition to the standard care you receive from your other physicians improves health. We believe that enrollment in the atrial fibrillation clinic will allow [place] to better care for you.   The phone number to the [facility] is [phone]. The clinic is staffed Monday through Friday from 8:30am to 5pm.  Parking Directions: The clinic is located in the Heart and Vascular Building connected to [facility]. 1)From [address] turn on to [address] and go to the 3rd entrance  (Heart and Vascular entrance) on the right. 2)Look to the right for Heart &Vascular Parking Garage. 3)A code for the entrance is required please call the clinic to receive this.   4)Take the elevators to the 1st floor. Registration is in the room with the glass walls at the end of the hallway.  If you have any trouble parking or locating the clinic, please dont hesitate to call [phone].  No driving for 4 days. No lifting over 5 lbs for 1 week. No sexual activity for 1 week. You may return to work in 1 week. Keep procedure site clean & dry. If you notice increased pain, swelling, bleeding or pus, call/return!  You may shower, but no soaking baths/hot tubs/pools for 1 week.

## 2016-10-02 NOTE — Anesthesia Procedure Notes (Signed)
Procedure Name: Intubation Date/Time: 10/02/2016 7:51 AM Performed by: Melina Copa, Jalayiah Bibian R Pre-anesthesia Checklist: Patient identified, Emergency Drugs available, Suction available and Patient being monitored Patient Re-evaluated:Patient Re-evaluated prior to inductionOxygen Delivery Method: Circle System Utilized Preoxygenation: Pre-oxygenation with 100% oxygen Intubation Type: IV induction Ventilation: Mask ventilation with difficulty LMA: LMA inserted LMA Size: 4.0 Laryngoscope Size: Mac and 3 Grade View: Grade I Tube type: Oral Tube size: 7.5 mm Number of attempts: 1 Airway Equipment and Method: Stylet and Oral airway Placement Confirmation: ETT inserted through vocal cords under direct vision,  positive ETCO2 and breath sounds checked- equal and bilateral Secured at: 21 cm Tube secured with: Tape Dental Injury: Teeth and Oropharynx as per pre-operative assessment  Comments: 4 LMA placed easily but unable to ventilate. LMA repositioned multiple times without success. Mask ventilation, decision made to intubate. Atraumatic oral intubation.

## 2016-10-02 NOTE — Anesthesia Postprocedure Evaluation (Signed)
Anesthesia Post Note  Patient: Jacqueline [name]  Procedure(s) Performed: Procedure(s) (LRB): Atrial Fibrillation Ablation (N/A)  Patient location during evaluation: Cath Lab Anesthesia Type: General Level of consciousness: awake, awake and alert and oriented Pain management: pain level controlled Vital Signs Assessment: post-procedure vital signs reviewed and stable Respiratory status: spontaneous breathing, nonlabored ventilation and respiratory function stable Cardiovascular status: blood pressure returned to baseline Anesthetic complications: no    Last Vitals:  Vitals:   10/02/16 1400 10/02/16 1500  BP: 118/85 103/65  Pulse: 61 (!) 49  Resp: 18 13  Temp:      Last Pain:  Vitals:   10/02/16 1135  TempSrc: Oral                 Teka Chanda COKER

## 2016-10-03 DIAGNOSIS — I10 Essential (primary) hypertension: Secondary | ICD-10-CM | POA: Diagnosis not present

## 2016-10-03 DIAGNOSIS — E785 Hyperlipidemia, unspecified: Secondary | ICD-10-CM | POA: Diagnosis not present

## 2016-10-03 DIAGNOSIS — I480 Paroxysmal atrial fibrillation: Secondary | ICD-10-CM | POA: Diagnosis not present

## 2016-10-03 DIAGNOSIS — I483 Typical atrial flutter: Secondary | ICD-10-CM | POA: Diagnosis not present

## 2016-10-03 NOTE — Progress Notes (Signed)
Pt given discharge packet. Education given about medication regimen and follow up appointments. Patient and spouse have no further questions at this time.

## 2016-10-04 ENCOUNTER — Ambulatory Visit (INDEPENDENT_AMBULATORY_CARE_PROVIDER_SITE_OTHER): Payer: 59 | Admitting: *Deleted

## 2016-10-04 DIAGNOSIS — I480 Paroxysmal atrial fibrillation: Secondary | ICD-10-CM | POA: Diagnosis not present

## 2016-10-04 NOTE — Progress Notes (Signed)
Carelink Summary Report / Loop Recorder 

## 2016-10-19 ENCOUNTER — Encounter: Payer: 59 | Admitting: Internal Medicine

## 2016-11-02 ENCOUNTER — Ambulatory Visit (HOSPITAL_COMMUNITY)
Admission: RE | Admit: 2016-11-02 | Discharge: 2016-11-02 | Disposition: A | Discharge status: Home / Self Care | Payer: 59 | Source: Ambulatory Visit | Attending: Nurse Practitioner | Admitting: Nurse Practitioner

## 2016-11-02 ENCOUNTER — Encounter (HOSPITAL_COMMUNITY): Payer: Self-pay | Admitting: Nurse Practitioner

## 2016-11-02 VITALS — BP 134/78 | HR 56 | Ht 66.0 in | Wt 209.2 lb

## 2016-11-02 DIAGNOSIS — Z7901 Long term (current) use of anticoagulants: Secondary | ICD-10-CM | POA: Diagnosis not present

## 2016-11-02 DIAGNOSIS — Z800 Family history of malignant neoplasm of digestive organs: Secondary | ICD-10-CM | POA: Insufficient documentation

## 2016-11-02 DIAGNOSIS — Z8249 Family history of ischemic heart disease and other diseases of the circulatory system: Secondary | ICD-10-CM | POA: Diagnosis not present

## 2016-11-02 DIAGNOSIS — I10 Essential (primary) hypertension: Secondary | ICD-10-CM | POA: Diagnosis not present

## 2016-11-02 DIAGNOSIS — E785 Hyperlipidemia, unspecified: Secondary | ICD-10-CM | POA: Diagnosis not present

## 2016-11-02 DIAGNOSIS — Z9889 Other specified postprocedural states: Secondary | ICD-10-CM | POA: Insufficient documentation

## 2016-11-02 DIAGNOSIS — Z803 Family history of malignant neoplasm of breast: Secondary | ICD-10-CM | POA: Diagnosis not present

## 2016-11-02 DIAGNOSIS — I480 Paroxysmal atrial fibrillation: Secondary | ICD-10-CM | POA: Insufficient documentation

## 2016-11-02 DIAGNOSIS — K219 Gastro-esophageal reflux disease without esophagitis: Secondary | ICD-10-CM | POA: Insufficient documentation

## 2016-11-02 DIAGNOSIS — R001 Bradycardia, unspecified: Secondary | ICD-10-CM | POA: Diagnosis not present

## 2016-11-02 DIAGNOSIS — Z79899 Other long term (current) drug therapy: Secondary | ICD-10-CM | POA: Insufficient documentation

## 2016-11-02 DIAGNOSIS — Z833 Family history of diabetes mellitus: Secondary | ICD-10-CM | POA: Insufficient documentation

## 2016-11-02 NOTE — Progress Notes (Addendum)
Patient ID: Jacqueline [name], female   DOB: 03-08-1951, 65 y.o.   MRN: [id number]     Primary Care Physician: Horatio Pel, MD Referring Physician: Dr. Holley Bouche is a 65 y.o. female with a h/o PAF that first diagnosed in April of this year but was treated for palpitaions in 2010 with metoprolol. She was recently seen in consult by Dr. Meda Coffee, wore a holter monitor, which confirmed afib and started on flecainide 50 mg bid, toprol stopped and xarelto started for a chadsvasc score of 3.She had a echo that showed normal EF, normal left atrial size. On f/u visit, she reported some lightheaded episodes, and had an ekg showing afib that converted to SR with a 3.6 termination pause recorded in the office. Flecainide increased to 100 mg bid. Repeat holter showed frequent PVC's and non sustained ventricular tach up to 10 beats with profound sinus bradycardia and multiple pauses during awake hours, longest pause was 3.5 ms,episodes of atrial flutter. With this report, flecainide was stopped and pt referred to afib clinic for further evaluation.  She has since had an ILR implanted 8/16. This has documented ongoing atrial fibrillation and atrial flutter. She also has sinus bradycardia. No pauses or other arrhythmia noted. She is not very active. She reports fatigue and shortness of breath with activity. Recent ETT was low risk.  She underwent afib ablation 12/22/15 and is in the afib clinic for f/u. She reports short burst of increased heartrate. No swallowing difficulties or rt groin issues. Continues on blood thinner.  F/u ablation 10/02/16, and she is doing well without any episodes of afib. She is very happy. No swallowing difficulties or groin issues.   Today, she denies symptoms of palpitations, chest pain, shortness of breath, orthopnea, PND, lower extremity edema, dizziness, presyncope, syncope, or neurologic sequela. The patient is tolerating medications without  difficulties and is otherwise without complaint today.   Past Medical History:  Diagnosis Date  . Gait abnormality   . GERD (gastroesophageal reflux disease)   . HTN (hypertension)   . Hyperlipidemia   . Paroxysmal atrial fibrillation (HCC)   . Stress incontinence, female   . Typical atrial flutter [facility])    Past Surgical History:  Procedure Laterality Date  . ANTERIOR AND POSTERIOR VAGINAL REPAIR    . BURCH PROCEDURE    . ELECTROPHYSIOLOGIC STUDY N/A 12/22/2015   CTI and PVI ablation by Dr Rayann Heman  . ELECTROPHYSIOLOGIC STUDY N/A 10/02/2016   Procedure: Atrial Fibrillation Ablation;  Surgeon: [name], MD;  Location: [place] [facility];  Service: Cardiovascular;  Laterality: N/A;  . EP IMPLANTABLE DEVICE N/A 08/11/2015   Procedure: Loop Recorder Insertion;  Surgeon: [name], MD;  Location: [place] [facility];  Service: Cardiovascular;  Laterality: N/A;  . VAGINAL HYSTERECTOMY      Current Outpatient Prescriptions  Medication Sig Dispense Refill  . acetaminophen (TYLENOL) 500 MG tablet Take 500 mg by mouth every 8 (eight) hours as needed for mild pain or moderate pain.    Marland Kitchen atorvastatin (LIPITOR) 20 MG tablet Take 10 mg by mouth daily.     . Calcium Carbonate (CALTRATE 600 PO) Take 600 mg by mouth daily.     . cetirizine (ZYRTEC) 10 MG tablet Take 10 mg by mouth daily.      . Cranberry 405 MG CAPS Take 405 mg by mouth daily.     . fluticasone (FLONASE) 50 MCG/ACT nasal spray Place 1 spray into both nostrils daily as needed for  allergies or rhinitis.    Marland Kitchen lisinopril-hydrochlorothiazide (PRINZIDE,ZESTORETIC) 20-12.5 MG per tablet Take 1 tablet by mouth daily.    . Multiple Vitamins-Minerals (CENTRUM PO) Take 1 tablet by mouth daily.      . pantoprazole (PROTONIX) 40 MG tablet Take 1 tablet by mouth daily. 1    . pramipexole (MIRAPEX) 0.125 MG tablet Take 0.25 mg by mouth every evening. 3 hours before bedtime  2  . Probiotic Product (PROBIOTIC PO) Take 1 capsule by mouth daily.     Alveda Reasons 20 MG TABS tablet TAKE 1 TABLET BY MOUTH EVERY DAY WITH SUPPER 30 tablet 5  . diltiazem (CARDIZEM) 30 MG tablet Take 30 mg by mouth as needed.      No current facility-administered medications for this encounter.     No Known Allergies  Social History   Social History  . Marital status: Married    Spouse name: N/A  . Number of children: 2  . Years of education: 16   Occupational History  . Full time legal assistant    Social History Main Topics  . Smoking status: Never Smoker  . Smokeless tobacco: Never Used  . Alcohol use 0.0 oz/week     Comment: Occasional  . Drug use: No  . Sexual activity: Not on file   Other Topics Concern  . Not on file   Social History Narrative   Lives at home with husband.   2 biological children, 2 step-children.   Right-handed.   2-4 cups caffeine per day.    Family History  Problem Relation Age of Onset  . Breast cancer Mother   . Colon cancer Mother   . Heart block Father     probable  . Bradycardia Father   . Diabetes Paternal Grandmother   . Diabetes Maternal Grandmother   . Heart disease Paternal Uncle     ROS- All systems are reviewed and negative except as per the HPI above  Physical Exam: Vitals:   11/02/16 0838  BP: 134/78  Pulse: (!) 56  Weight: 209 lb 3.2 oz (94.9 kg)  Height: 5\' 6"  (1.676 m)    GEN- The patient is well appearing, alert and oriented x 3 today.   Head- normocephalic, atraumatic Eyes-  Sclera clear, conjunctiva pink Ears- hearing intact Oropharynx- clear Neck- supple, no JVP Lymph- no cervical lymphadenopathy Lungs- Clear to ausculation bilaterally, normal work of breathing Heart- Regular rate and rhythm, no murmurs, rubs or gallops, PMI not laterally displaced GI- soft, NT, ND, + BS Extremities- no clubbing, cyanosis, or edema MS- no significant deformity or atrophy Skin- no rash or lesion Psych- euthymic mood, full affect Neuro- strength and sensation are intact  EKG- Marked  sinus brady at 56 bpm, pr int 160 ms, qrs int 82 ms, qtc 413 ms Epic records reviewed LInq reports reviewed  Assessment and Plan: 1. Paroxysmal Afib  S/p ablation, in SR Has not noticed any heart irregularity Continue xarelto  2.H/o  Bradycardia Continue monitoring via linq. Asymptomatic  F/u with Dr. Rayann Heman in January Remote check 11/20 Afib clinic as needed

## 2016-11-03 LAB — CUP PACEART REMOTE DEVICE CHECK
Date Time Interrogation Session: 20171019154107
Implantable Pulse Generator Implant Date: 20160825

## 2016-11-03 NOTE — Progress Notes (Signed)
Carelink summary report received. Battery status OK. Normal device function. No new symptom episodes, brady, or pause episodes. 5.3% AF, +Xarelto. Monthly summary reports and ROV/PRN

## 2016-11-05 ENCOUNTER — Ambulatory Visit (INDEPENDENT_AMBULATORY_CARE_PROVIDER_SITE_OTHER): Payer: 59 | Admitting: *Deleted

## 2016-11-05 DIAGNOSIS — I480 Paroxysmal atrial fibrillation: Secondary | ICD-10-CM | POA: Diagnosis not present

## 2016-11-05 NOTE — Progress Notes (Signed)
Carelink Summary Report / Loop Recorder 

## 2016-11-22 ENCOUNTER — Encounter: Payer: Self-pay | Admitting: Internal Medicine

## 2016-12-03 ENCOUNTER — Ambulatory Visit (INDEPENDENT_AMBULATORY_CARE_PROVIDER_SITE_OTHER): Payer: 59 | Admitting: *Deleted

## 2016-12-03 DIAGNOSIS — I480 Paroxysmal atrial fibrillation: Secondary | ICD-10-CM | POA: Diagnosis not present

## 2016-12-03 NOTE — Progress Notes (Signed)
Carelink Summary Report / Loop Recorder 

## 2016-12-16 LAB — CUP PACEART REMOTE DEVICE CHECK
Date Time Interrogation Session: 20171118160643
Implantable Pulse Generator Implant Date: 20160825

## 2016-12-16 NOTE — Progress Notes (Signed)
Carelink summary report received. Battery status OK. Normal device function. No new symptom episodes, tachy episodes, brady, or pause episodes. No new AF episodes. Monthly summary reports and ROV/PRN 

## 2017-01-02 ENCOUNTER — Ambulatory Visit (INDEPENDENT_AMBULATORY_CARE_PROVIDER_SITE_OTHER): Payer: 59 | Admitting: *Deleted

## 2017-01-02 ENCOUNTER — Encounter: Payer: 59 | Admitting: Internal Medicine

## 2017-01-02 DIAGNOSIS — I480 Paroxysmal atrial fibrillation: Secondary | ICD-10-CM | POA: Diagnosis not present

## 2017-01-04 NOTE — Progress Notes (Signed)
Carelink Summary Report / Loop Recorder 

## 2017-01-10 ENCOUNTER — Encounter: Payer: Self-pay | Admitting: Internal Medicine

## 2017-01-16 DIAGNOSIS — Z803 Family history of malignant neoplasm of breast: Secondary | ICD-10-CM | POA: Diagnosis not present

## 2017-01-16 DIAGNOSIS — Z1231 Encounter for screening mammogram for malignant neoplasm of breast: Secondary | ICD-10-CM | POA: Diagnosis not present

## 2017-01-21 LAB — CUP PACEART REMOTE DEVICE CHECK
Date Time Interrogation Session: 20171218171356
Implantable Pulse Generator Implant Date: 20160825

## 2017-01-24 DIAGNOSIS — G4733 Obstructive sleep apnea (adult) (pediatric): Secondary | ICD-10-CM | POA: Diagnosis not present

## 2017-01-25 ENCOUNTER — Encounter: Payer: 59 | Admitting: Internal Medicine

## 2017-02-01 ENCOUNTER — Ambulatory Visit (INDEPENDENT_AMBULATORY_CARE_PROVIDER_SITE_OTHER): Payer: 59 | Admitting: *Deleted

## 2017-02-01 DIAGNOSIS — I480 Paroxysmal atrial fibrillation: Secondary | ICD-10-CM

## 2017-02-04 NOTE — Progress Notes (Signed)
Carelink Summary Report / Loop Recorder 

## 2017-02-09 LAB — CUP PACEART REMOTE DEVICE CHECK
Date Time Interrogation Session: 20180117170815
Implantable Pulse Generator Implant Date: 20160825

## 2017-02-13 ENCOUNTER — Encounter: Payer: Self-pay | Admitting: *Deleted

## 2017-02-20 ENCOUNTER — Ambulatory Visit (INDEPENDENT_AMBULATORY_CARE_PROVIDER_SITE_OTHER): Payer: 59 | Admitting: Internal Medicine

## 2017-02-20 ENCOUNTER — Encounter: Payer: Self-pay | Admitting: Internal Medicine

## 2017-02-20 VITALS — BP 122/78 | HR 70 | Ht 66.5 in | Wt 209.0 lb

## 2017-02-20 DIAGNOSIS — I495 Sick sinus syndrome: Secondary | ICD-10-CM | POA: Diagnosis not present

## 2017-02-20 DIAGNOSIS — I480 Paroxysmal atrial fibrillation: Secondary | ICD-10-CM | POA: Diagnosis not present

## 2017-02-20 LAB — CUP PACEART INCLINIC DEVICE CHECK
Date Time Interrogation Session: 20180307141723
Implantable Pulse Generator Implant Date: 20160825

## 2017-02-20 LAB — CUP PACEART REMOTE DEVICE CHECK
Date Time Interrogation Session: 20180216171229
Implantable Pulse Generator Implant Date: 20160825

## 2017-02-20 NOTE — Progress Notes (Signed)
PCP: Horatio Pel, MD Primary Cardiologist:  Dr Rocco Serene is a 66 y.o. female who presents today for routine electrophysiology followup. She is doing great s/p redo afib ablation.  No afib.  Denies procedure related complications.  Today, she denies symptoms of palpitations, chest pain, shortness of breath,  lower extremity edema, dizziness, presyncope, syncope, or other concerns.    Past Medical History:  Diagnosis Date  . Gait abnormality   . GERD (gastroesophageal reflux disease)   . HTN (hypertension)   . Hyperlipidemia   . Paroxysmal atrial fibrillation (HCC)   . Stress incontinence, female   . Typical atrial flutter [facility])    Past Surgical History:  Procedure Laterality Date  . ANTERIOR AND POSTERIOR VAGINAL REPAIR    . BURCH PROCEDURE    . ELECTROPHYSIOLOGIC STUDY N/A 12/22/2015   CTI and PVI ablation by Dr Rayann Heman  . ELECTROPHYSIOLOGIC STUDY N/A 10/02/2016   Procedure: Atrial Fibrillation Ablation;  Surgeon: [name], MD;  Location: [place] [facility];  Service: Cardiovascular;  Laterality: N/A;  . EP IMPLANTABLE DEVICE N/A 08/11/2015   Procedure: Loop Recorder Insertion;  Surgeon: [name], MD;  Location: [place] [facility];  Service: Cardiovascular;  Laterality: N/A;  . VAGINAL HYSTERECTOMY      ROS- all systems are reviewed and negatives except as per HPI above  Current Outpatient Prescriptions  Medication Sig Dispense Refill  . acetaminophen (TYLENOL) 500 MG tablet Take 500 mg by mouth every 8 (eight) hours as needed for mild pain or moderate pain.    Marland Kitchen atorvastatin (LIPITOR) 20 MG tablet Take 10 mg by mouth daily.     . Calcium Carbonate (CALTRATE 600 PO) Take 600 mg by mouth daily.     . cetirizine (ZYRTEC) 10 MG tablet Take 10 mg by mouth daily.      . Cranberry 405 MG CAPS Take 405 mg by mouth daily.     . fluticasone (FLONASE) 50 MCG/ACT nasal spray Place 1 spray into both nostrils daily as needed for allergies or rhinitis.    Marland Kitchen  lisinopril-hydrochlorothiazide (PRINZIDE,ZESTORETIC) 20-12.5 MG per tablet Take 1 tablet by mouth daily.    . Multiple Vitamins-Minerals (CENTRUM PO) Take 1 tablet by mouth daily.      . pantoprazole (PROTONIX) 40 MG tablet Take 1 tablet by mouth daily. 1    . pramipexole (MIRAPEX) 0.125 MG tablet Take 0.25 mg by mouth every evening. 3 hours before bedtime  2  . Probiotic Product (PROBIOTIC PO) Take 1 capsule by mouth daily.    Alveda Reasons 20 MG TABS tablet TAKE 1 TABLET BY MOUTH EVERY DAY WITH SUPPER 30 tablet 5  . diltiazem (CARDIZEM) 30 MG tablet Take 30 mg by mouth as needed (Take as directed).      No current facility-administered medications for this visit.     Physical Exam: Vitals:   02/20/17 1210  BP: 122/78  Pulse: 70  SpO2: 97%  Weight: 209 lb (94.8 kg)  Height: 5' 6.5" (1.689 m)    GEN- The patient is well appearing, alert and oriented x 3 today.   Head- normocephalic, atraumatic Eyes-  Sclera clear, conjunctiva pink Ears- hearing intact Oropharynx- clear Lungs- Clear to ausculation bilaterally, normal work of breathing Heart- Regular rate and rhythm with frequent ectopy, no murmurs, rubs or gallops, PMI not laterally displaced GI- soft, NT, ND, + BS Extremities- no clubbing, cyanosis, or edema  ILR interrogation personally reviewed  Assessment and Plan:  1. Paroxysmal atrial  fibrillation No afib post ablation off of AAD therapy Doing well Continue xarelto (chads2vasc score is 3).  Could consider stopping anticoagulating down the road if no AF on ILR.  She is aware however that we have very little data to support his approach  2. Sinus brady Post termination pauses resolved.    No changes  3. HTN Stable No change required today  Return to see me in 3 months   [name] MD, [facility] 02/20/2017 12:37 PM

## 2017-02-20 NOTE — Patient Instructions (Addendum)
Medication Instructions:  Your physician recommends that you continue on your current medications as directed. Please refer to the Current Medication list given to you today.   Labwork: None ordered   Testing/Procedures: None ordered   Follow-Up: Your physician recommends that you schedule a follow-up appointment in: 3 months with Dr Rayann Heman

## 2017-03-02 ENCOUNTER — Other Ambulatory Visit: Payer: Self-pay | Admitting: Internal Medicine

## 2017-03-04 ENCOUNTER — Ambulatory Visit (INDEPENDENT_AMBULATORY_CARE_PROVIDER_SITE_OTHER): Payer: 59 | Admitting: *Deleted

## 2017-03-04 DIAGNOSIS — I480 Paroxysmal atrial fibrillation: Secondary | ICD-10-CM | POA: Diagnosis not present

## 2017-03-04 NOTE — Progress Notes (Signed)
Carelink Summary Report / Loop Recorder 

## 2017-03-04 NOTE — Telephone Encounter (Signed)
Age 66 Wt 94.8 02/20/2017  Saw Dr Allred 02/20/2017  09/19/2016 Hgb 14.1 HCT 40.9  11/22/2016 SrCr 1.09 CrCl  88.18  Refill done for Xarelto 20 mg daily as requested

## 2017-03-18 LAB — CUP PACEART REMOTE DEVICE CHECK
Date Time Interrogation Session: 20180318174101
Implantable Pulse Generator Implant Date: 20160825

## 2017-03-25 DIAGNOSIS — R3915 Urgency of urination: Secondary | ICD-10-CM | POA: Diagnosis not present

## 2017-03-25 DIAGNOSIS — N390 Urinary tract infection, site not specified: Secondary | ICD-10-CM | POA: Diagnosis not present

## 2017-03-27 DIAGNOSIS — L570 Actinic keratosis: Secondary | ICD-10-CM | POA: Diagnosis not present

## 2017-03-27 DIAGNOSIS — L723 Sebaceous cyst: Secondary | ICD-10-CM | POA: Diagnosis not present

## 2017-03-27 DIAGNOSIS — D2261 Melanocytic nevi of right upper limb, including shoulder: Secondary | ICD-10-CM | POA: Diagnosis not present

## 2017-03-27 DIAGNOSIS — D225 Melanocytic nevi of trunk: Secondary | ICD-10-CM | POA: Diagnosis not present

## 2017-04-02 ENCOUNTER — Ambulatory Visit (INDEPENDENT_AMBULATORY_CARE_PROVIDER_SITE_OTHER): Payer: 59 | Admitting: *Deleted

## 2017-04-02 DIAGNOSIS — I480 Paroxysmal atrial fibrillation: Secondary | ICD-10-CM

## 2017-04-02 NOTE — Progress Notes (Signed)
Carelink Summary Report / Loop Recorder 

## 2017-04-19 LAB — CUP PACEART REMOTE DEVICE CHECK
Date Time Interrogation Session: 20180417180637
Implantable Pulse Generator Implant Date: 20160825

## 2017-04-25 DIAGNOSIS — R0789 Other chest pain: Secondary | ICD-10-CM | POA: Diagnosis not present

## 2017-04-25 DIAGNOSIS — R05 Cough: Secondary | ICD-10-CM | POA: Diagnosis not present

## 2017-04-25 DIAGNOSIS — E119 Type 2 diabetes mellitus without complications: Secondary | ICD-10-CM | POA: Diagnosis not present

## 2017-05-02 ENCOUNTER — Ambulatory Visit (INDEPENDENT_AMBULATORY_CARE_PROVIDER_SITE_OTHER): Payer: 59 | Admitting: *Deleted

## 2017-05-02 DIAGNOSIS — I495 Sick sinus syndrome: Secondary | ICD-10-CM

## 2017-05-02 NOTE — Progress Notes (Signed)
Carelink Summary Report / Loop Recorder 

## 2017-05-03 ENCOUNTER — Telehealth: Payer: Self-pay | Admitting: *Deleted

## 2017-05-03 NOTE — Telephone Encounter (Signed)
Spoke with patient regarding pause episode on LINQ from 04/29/17 at 2151, duration 4sec.  Patient reports that she had a GI illness this day and that she was likely vomiting at this time.  She feels much better now and denies any syncope.  Patient is aware that Dr. Rayann Heman will review this episode and that I will call her with any additional recommendations.  She is appreciative of call and denies additional questions or concerns at this time.  Episode placed in Dr. Otilio Connors folder for review.

## 2017-05-06 DIAGNOSIS — R300 Dysuria: Secondary | ICD-10-CM | POA: Diagnosis not present

## 2017-05-06 DIAGNOSIS — N390 Urinary tract infection, site not specified: Secondary | ICD-10-CM | POA: Diagnosis not present

## 2017-05-07 DIAGNOSIS — E119 Type 2 diabetes mellitus without complications: Secondary | ICD-10-CM | POA: Diagnosis not present

## 2017-05-13 LAB — CUP PACEART REMOTE DEVICE CHECK
Date Time Interrogation Session: 20180517183821
Implantable Pulse Generator Implant Date: 20160825

## 2017-05-27 ENCOUNTER — Ambulatory Visit (INDEPENDENT_AMBULATORY_CARE_PROVIDER_SITE_OTHER): Payer: 59 | Admitting: Internal Medicine

## 2017-05-27 ENCOUNTER — Encounter: Payer: Self-pay | Admitting: Internal Medicine

## 2017-05-27 VITALS — BP 122/72 | HR 59 | Ht 66.0 in | Wt 198.8 lb

## 2017-05-27 DIAGNOSIS — I10 Essential (primary) hypertension: Secondary | ICD-10-CM | POA: Diagnosis not present

## 2017-05-27 DIAGNOSIS — I495 Sick sinus syndrome: Secondary | ICD-10-CM | POA: Diagnosis not present

## 2017-05-27 DIAGNOSIS — I480 Paroxysmal atrial fibrillation: Secondary | ICD-10-CM | POA: Diagnosis not present

## 2017-05-27 LAB — CUP PACEART INCLINIC DEVICE CHECK
Date Time Interrogation Session: 20180611134524
Implantable Pulse Generator Implant Date: 20160825

## 2017-05-27 NOTE — Patient Instructions (Addendum)
Medication Instructions:  Your physician recommends that you continue on your current medications as directed. Please refer to the Current Medication list given to you today.   Labwork: None ordered   Testing/Procedures: None ordered   Follow-Up:  Your physician recommends that you schedule a follow-up appointment in: 3 months with [name], NP and 6 months with Dr Allred   Any Other Special Instructions Will Be Listed Below (If Applicable).     If you need a refill on your cardiac medications before your next appointment, please call your pharmacy.   

## 2017-05-27 NOTE — Progress Notes (Signed)
PCP: Deland Pretty, MD  Jacqueline [name] is a 66 y.o. female who presents today for routine electrophysiology followup.  Since last being seen in our clinic, the patient reports doing very well.  No afib episodes post ablation!  Today, she denies symptoms of palpitations, chest pain, shortness of breath,  lower extremity edema, dizziness, presyncope, or syncope.  The patient is otherwise without complaint today.   Past Medical History:  Diagnosis Date  . Gait abnormality   . GERD (gastroesophageal reflux disease)   . HTN (hypertension)   . Hyperlipidemia   . Paroxysmal atrial fibrillation (HCC)   . Stress incontinence, female   . Typical atrial flutter [facility])    Past Surgical History:  Procedure Laterality Date  . ANTERIOR AND POSTERIOR VAGINAL REPAIR    . BURCH PROCEDURE    . ELECTROPHYSIOLOGIC STUDY N/A 12/22/2015   CTI and PVI ablation by Dr Rayann Heman  . ELECTROPHYSIOLOGIC STUDY N/A 10/02/2016   Procedure: Atrial Fibrillation Ablation;  Surgeon: [name], MD;  Location: [place] [facility];  Service: Cardiovascular;  Laterality: N/A;  . EP IMPLANTABLE DEVICE N/A 08/11/2015   Procedure: Loop Recorder Insertion;  Surgeon: [name], MD;  Location: [place] [facility];  Service: Cardiovascular;  Laterality: N/A;  . VAGINAL HYSTERECTOMY      ROS- all systems are reviewed and negatives except as per HPI above  Current Outpatient Prescriptions  Medication Sig Dispense Refill  . acetaminophen (TYLENOL) 500 MG tablet Take 500 mg by mouth every 8 (eight) hours as needed for mild pain or moderate pain.    Marland Kitchen atorvastatin (LIPITOR) 20 MG tablet Take 10 mg by mouth daily.     . Calcium Carbonate (CALTRATE 600 PO) Take 600 mg by mouth daily.     . cetirizine (ZYRTEC) 10 MG tablet Take 10 mg by mouth daily.      . Cranberry 405 MG CAPS Take 405 mg by mouth daily.     Marland Kitchen diltiazem (CARDIZEM) 30 MG tablet Take 30 mg by mouth as needed (Take as directed).     . fluticasone (FLONASE) 50 MCG/ACT  nasal spray Place 1 spray into both nostrils daily as needed for allergies or rhinitis.    Marland Kitchen losartan-hydrochlorothiazide (HYZAAR) 50-12.5 MG tablet Take 1 tablet by mouth daily.  12  . Multiple Vitamins-Minerals (CENTRUM PO) Take 1 tablet by mouth daily.      . pantoprazole (PROTONIX) 40 MG tablet Take 1 tablet by mouth daily. 1    . pramipexole (MIRAPEX) 0.125 MG tablet Take 0.25 mg by mouth every evening. 3 hours before bedtime  2  . Probiotic Product (PROBIOTIC PO) Take 1 capsule by mouth daily.    Alveda Reasons 20 MG TABS tablet TAKE 1 TABLET BY MOUTH EVERY DAY WITH SUPPER 30 tablet 5   No current facility-administered medications for this visit.     Physical Exam: Vitals:   05/27/17 1139  BP: 122/72  Pulse: (!) 59  SpO2: 95%  Weight: 198 lb 12.8 oz (90.2 kg)  Height: 5\' 6"  (1.676 m)    GEN- The patient is well appearing, alert and oriented x 3 today.   Head- normocephalic, atraumatic Eyes-  Sclera clear, conjunctiva pink Ears- hearing intact Oropharynx- clear Lungs- Clear to ausculation bilaterally, normal work of breathing Heart- Regular rate and rhythm, no murmurs, rubs or gallops, PMI not laterally displaced GI- soft, NT, ND, + BS Extremities- no clubbing, cyanosis, or edema  ILR interrogation ordered today is personally reviewed and shows  no afib post ablation, 1 asymptomatic pause noted during time of GI illness  Assessment and Plan:  1. Paroxysmal atrial fibrillation No recurrence post ablation by ILR interrogation today chads2vasc score is 4 (recently diagnosed with diabetes).  Continue on xarelto  2. Sinus bradycardia Asymptomatic No indication for pacing currently  3. HTN Stable No change required today  Return to see Butch Penny in AF clinic in 3 months I will see in 6 months  [name] MD, [facility] 05/27/2017 11:58 AM

## 2017-06-03 ENCOUNTER — Ambulatory Visit (INDEPENDENT_AMBULATORY_CARE_PROVIDER_SITE_OTHER): Payer: 59 | Admitting: *Deleted

## 2017-06-03 DIAGNOSIS — I480 Paroxysmal atrial fibrillation: Secondary | ICD-10-CM | POA: Diagnosis not present

## 2017-06-03 NOTE — Progress Notes (Signed)
Carelink Summary Report / Loop Recorder 

## 2017-06-10 DIAGNOSIS — E7800 Pure hypercholesterolemia, unspecified: Secondary | ICD-10-CM | POA: Diagnosis not present

## 2017-06-10 DIAGNOSIS — E119 Type 2 diabetes mellitus without complications: Secondary | ICD-10-CM | POA: Diagnosis not present

## 2017-06-10 DIAGNOSIS — Z0000 Encounter for general adult medical examination without abnormal findings: Secondary | ICD-10-CM | POA: Diagnosis not present

## 2017-06-11 LAB — CUP PACEART REMOTE DEVICE CHECK
Date Time Interrogation Session: 20180616193953
Implantable Pulse Generator Implant Date: 20160825

## 2017-06-13 DIAGNOSIS — I10 Essential (primary) hypertension: Secondary | ICD-10-CM | POA: Diagnosis not present

## 2017-06-13 DIAGNOSIS — Z0000 Encounter for general adult medical examination without abnormal findings: Secondary | ICD-10-CM | POA: Diagnosis not present

## 2017-06-13 DIAGNOSIS — E119 Type 2 diabetes mellitus without complications: Secondary | ICD-10-CM | POA: Diagnosis not present

## 2017-07-01 ENCOUNTER — Ambulatory Visit (INDEPENDENT_AMBULATORY_CARE_PROVIDER_SITE_OTHER): Payer: 59 | Admitting: *Deleted

## 2017-07-01 DIAGNOSIS — I480 Paroxysmal atrial fibrillation: Secondary | ICD-10-CM

## 2017-07-02 NOTE — Progress Notes (Signed)
Carelink Summary Report / Loop Recorder 

## 2017-07-03 DIAGNOSIS — H00034 Abscess of left upper eyelid: Secondary | ICD-10-CM | POA: Diagnosis not present

## 2017-07-08 LAB — CUP PACEART REMOTE DEVICE CHECK
Date Time Interrogation Session: 20180716201348
Implantable Pulse Generator Implant Date: 20160825

## 2017-07-08 NOTE — Progress Notes (Signed)
Carelink summary report received. Battery status OK. Normal device function. No new symptom episodes, tachy episodes, brady, or pause episodes. No new AF episodes. Monthly summary reports and ROV/PRN 

## 2017-07-31 ENCOUNTER — Ambulatory Visit (INDEPENDENT_AMBULATORY_CARE_PROVIDER_SITE_OTHER): Payer: 59 | Admitting: *Deleted

## 2017-07-31 ENCOUNTER — Other Ambulatory Visit: Payer: Self-pay | Admitting: Internal Medicine

## 2017-07-31 DIAGNOSIS — E119 Type 2 diabetes mellitus without complications: Secondary | ICD-10-CM | POA: Diagnosis not present

## 2017-07-31 DIAGNOSIS — I480 Paroxysmal atrial fibrillation: Secondary | ICD-10-CM | POA: Diagnosis not present

## 2017-08-06 LAB — CUP PACEART REMOTE DEVICE CHECK
Date Time Interrogation Session: 20180815203928
Implantable Pulse Generator Implant Date: 20160825

## 2017-08-06 NOTE — Progress Notes (Signed)
Carelink summary report received. Battery status OK. Normal device function. No new symptom episodes, tachy episodes, brady, or pause episodes. No new AF episodes. Monthly summary reports and ROV/PRN 

## 2017-08-12 DIAGNOSIS — E119 Type 2 diabetes mellitus without complications: Secondary | ICD-10-CM | POA: Diagnosis not present

## 2017-08-12 DIAGNOSIS — E7800 Pure hypercholesterolemia, unspecified: Secondary | ICD-10-CM | POA: Diagnosis not present

## 2017-08-12 DIAGNOSIS — I10 Essential (primary) hypertension: Secondary | ICD-10-CM | POA: Diagnosis not present

## 2017-08-22 DIAGNOSIS — N390 Urinary tract infection, site not specified: Secondary | ICD-10-CM | POA: Diagnosis not present

## 2017-08-22 DIAGNOSIS — R300 Dysuria: Secondary | ICD-10-CM | POA: Diagnosis not present

## 2017-08-28 ENCOUNTER — Ambulatory Visit (HOSPITAL_COMMUNITY)
Admission: RE | Admit: 2017-08-28 | Discharge: 2017-08-28 | Disposition: A | Discharge status: Home / Self Care | Payer: 59 | Source: Ambulatory Visit | Attending: Nurse Practitioner | Admitting: Nurse Practitioner

## 2017-08-28 ENCOUNTER — Encounter (HOSPITAL_COMMUNITY): Payer: Self-pay | Admitting: Nurse Practitioner

## 2017-08-28 VITALS — BP 116/62 | HR 62 | Ht 66.0 in | Wt 193.8 lb

## 2017-08-28 DIAGNOSIS — Z7901 Long term (current) use of anticoagulants: Secondary | ICD-10-CM | POA: Diagnosis not present

## 2017-08-28 DIAGNOSIS — I10 Essential (primary) hypertension: Secondary | ICD-10-CM | POA: Diagnosis not present

## 2017-08-28 DIAGNOSIS — I483 Typical atrial flutter: Secondary | ICD-10-CM | POA: Insufficient documentation

## 2017-08-28 DIAGNOSIS — K219 Gastro-esophageal reflux disease without esophagitis: Secondary | ICD-10-CM | POA: Insufficient documentation

## 2017-08-28 DIAGNOSIS — Z79899 Other long term (current) drug therapy: Secondary | ICD-10-CM | POA: Insufficient documentation

## 2017-08-28 DIAGNOSIS — I480 Paroxysmal atrial fibrillation: Secondary | ICD-10-CM | POA: Insufficient documentation

## 2017-08-28 DIAGNOSIS — I4891 Unspecified atrial fibrillation: Secondary | ICD-10-CM | POA: Diagnosis present

## 2017-08-28 DIAGNOSIS — R001 Bradycardia, unspecified: Secondary | ICD-10-CM | POA: Insufficient documentation

## 2017-08-28 DIAGNOSIS — E785 Hyperlipidemia, unspecified: Secondary | ICD-10-CM | POA: Diagnosis not present

## 2017-08-28 NOTE — Progress Notes (Signed)
Patient ID: Jacqueline [name], female   DOB: 1951-03-20, 66 y.o.   MRN: [id number]     Primary Care Physician: Deland Pretty, MD Referring Physician: Dr. Rayann Heman Cardiologist: Dr. Rocco Serene is a 66 y.o. female with a h/o PAF that first diagnosed in April of this year but was treated for palpitaions in 2010 with metoprolol. She was recently seen in consult by Dr. Meda Coffee, wore a holter monitor, which confirmed afib and started on flecainide 50 mg bid, toprol stopped and xarelto started for a chadsvasc score of 3.She had a echo that showed normal EF, normal left atrial size. On f/u visit, she reported some lightheaded episodes, and had an ekg showing afib that converted to SR with a 3.6 termination pause recorded in the office. Flecainide increased to 100 mg bid. Repeat holter showed frequent PVC's and non sustained ventricular tach up to 10 beats with profound sinus bradycardia and multiple pauses during awake hours, longest pause was 3.5 ms,episodes of atrial flutter. With this report, flecainide was stopped and pt referred to afib clinic for further evaluation.   She has since had an ILR implanted 8/16. This has documented ongoing atrial fibrillation and atrial flutter. She also has sinus bradycardia. No pauses or other arrhythmia noted. She is not very active. She reports fatigue and shortness of breath with activity. Recent ETT was low risk.  She underwent afib ablation 12/22/15 and is in the afib clinic for f/u. She reports short burst of increased heartrate. No swallowing difficulties or rt groin issues. Continues on blood thinner.  F/u ablation 10/02/16, and she is doing well without any episodes of afib. She is very happy. No swallowing difficulties or groin issues.   F/u in fib clinic 9/12, she is doing well post ablation. No afib that she is aware of. Continues with Linq monitoring. No issues with xarelto.  Today, she denies symptoms of palpitations, chest pain,  shortness of breath, orthopnea, PND, lower extremity edema, dizziness, presyncope, syncope, or neurologic sequela. The patient is tolerating medications without difficulties and is otherwise without complaint today.   Past Medical History:  Diagnosis Date  . Gait abnormality   . GERD (gastroesophageal reflux disease)   . HTN (hypertension)   . Hyperlipidemia   . Paroxysmal atrial fibrillation (HCC)   . Stress incontinence, female   . Typical atrial flutter [facility])    Past Surgical History:  Procedure Laterality Date  . ANTERIOR AND POSTERIOR VAGINAL REPAIR    . BURCH PROCEDURE    . ELECTROPHYSIOLOGIC STUDY N/A 12/22/2015   CTI and PVI ablation by Dr Rayann Heman  . ELECTROPHYSIOLOGIC STUDY N/A 10/02/2016   Procedure: Atrial Fibrillation Ablation;  Surgeon: [name], MD;  Location: [place] [facility];  Service: Cardiovascular;  Laterality: N/A;  . EP IMPLANTABLE DEVICE N/A 08/11/2015   Procedure: Loop Recorder Insertion;  Surgeon: [name], MD;  Location: [place] [facility];  Service: Cardiovascular;  Laterality: N/A;  . VAGINAL HYSTERECTOMY      Current Outpatient Prescriptions  Medication Sig Dispense Refill  . acetaminophen (TYLENOL) 500 MG tablet Take 500 mg by mouth every 8 (eight) hours as needed for mild pain or moderate pain.    Marland Kitchen atorvastatin (LIPITOR) 20 MG tablet Take 10 mg by mouth daily.     . Calcium Carbonate (CALTRATE 600 PO) Take 600 mg by mouth daily.     . cetirizine (ZYRTEC) 10 MG tablet Take 10 mg by mouth daily.      Marland Kitchen  Cranberry 405 MG CAPS Take 405 mg by mouth daily.     . fluticasone (FLONASE) 50 MCG/ACT nasal spray Place 1 spray into both nostrils daily as needed for allergies or rhinitis.    Marland Kitchen losartan-hydrochlorothiazide (HYZAAR) 50-12.5 MG tablet Take 1 tablet by mouth daily.  12  . Multiple Vitamins-Minerals (CENTRUM PO) Take 1 tablet by mouth daily.      . pantoprazole (PROTONIX) 40 MG tablet Take 1 tablet by mouth daily. 1    . pramipexole (MIRAPEX)  0.125 MG tablet Take 0.25 mg by mouth every evening. 3 hours before bedtime  2  . Probiotic Product (PROBIOTIC PO) Take 1 capsule by mouth daily.    Alveda Reasons 20 MG TABS tablet TAKE 1 TABLET BY MOUTH EVERY DAY WITH SUPPER 30 tablet 5  . diltiazem (CARDIZEM) 30 MG tablet Take 30 mg by mouth as needed (Take as directed).      No current facility-administered medications for this encounter.     Allergies  Allergen Reactions  . Lisinopril     Cough    Social History   Social History  . Marital status: Married    Spouse name: N/A  . Number of children: 2  . Years of education: 16   Occupational History  . Full time legal assistant    Social History Main Topics  . Smoking status: Never Smoker  . Smokeless tobacco: Never Used  . Alcohol use 0.0 oz/week     Comment: Occasional  . Drug use: No  . Sexual activity: Not on file   Other Topics Concern  . Not on file   Social History Narrative   Lives at home with husband.   2 biological children, 2 step-children.   Right-handed.   2-4 cups caffeine per day.    Family History  Problem Relation Age of Onset  . Breast cancer Mother   . Colon cancer Mother   . Heart block Father        probable  . Bradycardia Father   . Diabetes Paternal Grandmother   . Diabetes Maternal Grandmother   . Heart disease Paternal Uncle     ROS- All systems are reviewed and negative except as per the HPI above  Physical Exam: Vitals:   08/28/17 1031  BP: 116/62  Pulse: 62  Weight: 193 lb 12.8 oz (87.9 kg)  Height: 5\' 6"  (1.676 m)    GEN- The patient is well appearing, alert and oriented x 3 today.   Head- normocephalic, atraumatic Eyes-  Sclera clear, conjunctiva pink Ears- hearing intact Oropharynx- clear Neck- supple, no JVP Lymph- no cervical lymphadenopathy Lungs- Clear to ausculation bilaterally, normal work of breathing Heart- Regular rate and rhythm, no murmurs, rubs or gallops, PMI not laterally displaced GI- soft, NT, ND,  + BS Extremities- no clubbing, cyanosis, or edema MS- no significant deformity or atrophy Skin- no rash or lesion Psych- euthymic mood, full affect Neuro- strength and sensation are intact  EKG- NSR at 62 bpm, at 62 bpm,  pr int 174 ms, qrs int 82 ms, qtc 422 ms Epic records reviewed LInq reports reviewed  Assessment and Plan: 1. Paroxysmal Afib  S/p ablation, in SR Has not noticed any heart irregularity Continue xarelto for chadsvasc score of at least 3  2.H/o  Bradycardia Continue monitoring via linq. Asymptomatic  F/u with Dr. Rayann Heman in December as scheduled Remote check 9/14 Afib clinic as needed  Geroge Baseman. Carroll, [facility] [address]  [place], [place] [address] [phone]

## 2017-08-30 ENCOUNTER — Ambulatory Visit (INDEPENDENT_AMBULATORY_CARE_PROVIDER_SITE_OTHER): Payer: 59 | Admitting: *Deleted

## 2017-08-30 DIAGNOSIS — I480 Paroxysmal atrial fibrillation: Secondary | ICD-10-CM

## 2017-08-31 ENCOUNTER — Other Ambulatory Visit: Payer: Self-pay | Admitting: Internal Medicine

## 2017-09-02 NOTE — Telephone Encounter (Signed)
Pt last saw DrAllred on 05/27/17, last labs 06/10/17 Creat 1.10, age 66, weight 87.9kg, CrCl 67.92, based on CrCl pt is on appropriate dosage of Xarelto 20mg  QD.  Will refill rx.

## 2017-09-02 NOTE — Progress Notes (Signed)
Carelink Summary Report / Loop Recorder 

## 2017-09-03 ENCOUNTER — Other Ambulatory Visit: Payer: Self-pay | Admitting: Internal Medicine

## 2017-09-03 LAB — CUP PACEART REMOTE DEVICE CHECK
Date Time Interrogation Session: 20180914213943
Implantable Pulse Generator Implant Date: 20160825

## 2017-09-09 DIAGNOSIS — H00025 Hordeolum internum left lower eyelid: Secondary | ICD-10-CM | POA: Diagnosis not present

## 2017-09-09 DIAGNOSIS — E119 Type 2 diabetes mellitus without complications: Secondary | ICD-10-CM | POA: Diagnosis not present

## 2017-09-25 DIAGNOSIS — H00015 Hordeolum externum left lower eyelid: Secondary | ICD-10-CM | POA: Diagnosis not present

## 2017-09-30 ENCOUNTER — Ambulatory Visit (INDEPENDENT_AMBULATORY_CARE_PROVIDER_SITE_OTHER): Payer: 59 | Admitting: *Deleted

## 2017-09-30 DIAGNOSIS — I480 Paroxysmal atrial fibrillation: Secondary | ICD-10-CM

## 2017-09-30 NOTE — Progress Notes (Signed)
Carelink Summary Report / Loop Recorder 

## 2017-10-02 LAB — CUP PACEART REMOTE DEVICE CHECK
Date Time Interrogation Session: 20181014221054
Implantable Pulse Generator Implant Date: 20160825

## 2017-10-29 ENCOUNTER — Ambulatory Visit (INDEPENDENT_AMBULATORY_CARE_PROVIDER_SITE_OTHER): Payer: 59 | Admitting: *Deleted

## 2017-10-29 DIAGNOSIS — R319 Hematuria, unspecified: Secondary | ICD-10-CM | POA: Diagnosis not present

## 2017-10-29 DIAGNOSIS — N390 Urinary tract infection, site not specified: Secondary | ICD-10-CM | POA: Diagnosis not present

## 2017-10-29 DIAGNOSIS — I480 Paroxysmal atrial fibrillation: Secondary | ICD-10-CM

## 2017-10-29 DIAGNOSIS — R300 Dysuria: Secondary | ICD-10-CM | POA: Diagnosis not present

## 2017-10-30 NOTE — Progress Notes (Signed)
Carelink Summary Report / Loop Recorder 

## 2017-11-13 DIAGNOSIS — R319 Hematuria, unspecified: Secondary | ICD-10-CM | POA: Diagnosis not present

## 2017-11-15 ENCOUNTER — Encounter: Payer: Self-pay | Admitting: Internal Medicine

## 2017-11-15 LAB — CUP PACEART REMOTE DEVICE CHECK
Date Time Interrogation Session: 20181113223936
Implantable Pulse Generator Implant Date: 20160825

## 2017-11-25 ENCOUNTER — Encounter: Payer: 59 | Admitting: Internal Medicine

## 2017-11-28 ENCOUNTER — Ambulatory Visit (INDEPENDENT_AMBULATORY_CARE_PROVIDER_SITE_OTHER): Payer: 59 | Admitting: *Deleted

## 2017-11-28 DIAGNOSIS — I480 Paroxysmal atrial fibrillation: Secondary | ICD-10-CM

## 2017-11-29 NOTE — Progress Notes (Signed)
Carelink Summary Report / Loop Recorder 

## 2017-12-06 DIAGNOSIS — Z7901 Long term (current) use of anticoagulants: Secondary | ICD-10-CM | POA: Diagnosis not present

## 2017-12-06 DIAGNOSIS — M8580 Other specified disorders of bone density and structure, unspecified site: Secondary | ICD-10-CM | POA: Diagnosis not present

## 2017-12-06 DIAGNOSIS — E119 Type 2 diabetes mellitus without complications: Secondary | ICD-10-CM | POA: Diagnosis not present

## 2017-12-06 DIAGNOSIS — E7800 Pure hypercholesterolemia, unspecified: Secondary | ICD-10-CM | POA: Diagnosis not present

## 2017-12-09 LAB — CUP PACEART REMOTE DEVICE CHECK
Date Time Interrogation Session: 20181213230852
Implantable Pulse Generator Implant Date: 20160825

## 2017-12-13 DIAGNOSIS — E119 Type 2 diabetes mellitus without complications: Secondary | ICD-10-CM | POA: Diagnosis not present

## 2017-12-13 DIAGNOSIS — E7800 Pure hypercholesterolemia, unspecified: Secondary | ICD-10-CM | POA: Diagnosis not present

## 2017-12-13 DIAGNOSIS — Z23 Encounter for immunization: Secondary | ICD-10-CM | POA: Diagnosis not present

## 2017-12-13 DIAGNOSIS — G4733 Obstructive sleep apnea (adult) (pediatric): Secondary | ICD-10-CM | POA: Diagnosis not present

## 2017-12-17 DIAGNOSIS — C642 Malignant neoplasm of left kidney, except renal pelvis: Secondary | ICD-10-CM | POA: Insufficient documentation

## 2017-12-25 DIAGNOSIS — Z87440 Personal history of urinary (tract) infections: Secondary | ICD-10-CM | POA: Diagnosis not present

## 2017-12-25 DIAGNOSIS — R3121 Asymptomatic microscopic hematuria: Secondary | ICD-10-CM | POA: Diagnosis not present

## 2017-12-30 ENCOUNTER — Ambulatory Visit (INDEPENDENT_AMBULATORY_CARE_PROVIDER_SITE_OTHER): Payer: 59 | Admitting: *Deleted

## 2017-12-30 DIAGNOSIS — I480 Paroxysmal atrial fibrillation: Secondary | ICD-10-CM

## 2018-01-01 NOTE — Progress Notes (Signed)
Carelink Summary Report / Loop Recorder 

## 2018-01-06 DIAGNOSIS — R3121 Asymptomatic microscopic hematuria: Secondary | ICD-10-CM | POA: Diagnosis not present

## 2018-01-06 DIAGNOSIS — K5730 Diverticulosis of large intestine without perforation or abscess without bleeding: Secondary | ICD-10-CM | POA: Diagnosis not present

## 2018-01-10 LAB — CUP PACEART REMOTE DEVICE CHECK
Date Time Interrogation Session: 20190112233607
Implantable Pulse Generator Implant Date: 20160825

## 2018-01-15 DIAGNOSIS — N390 Urinary tract infection, site not specified: Secondary | ICD-10-CM | POA: Diagnosis not present

## 2018-01-15 DIAGNOSIS — R300 Dysuria: Secondary | ICD-10-CM | POA: Diagnosis not present

## 2018-01-22 ENCOUNTER — Encounter: Payer: 59 | Admitting: Internal Medicine

## 2018-01-22 DIAGNOSIS — R3121 Asymptomatic microscopic hematuria: Secondary | ICD-10-CM | POA: Diagnosis not present

## 2018-01-22 DIAGNOSIS — N3001 Acute cystitis with hematuria: Secondary | ICD-10-CM | POA: Diagnosis not present

## 2018-01-27 ENCOUNTER — Ambulatory Visit (INDEPENDENT_AMBULATORY_CARE_PROVIDER_SITE_OTHER): Payer: 59 | Admitting: *Deleted

## 2018-01-27 DIAGNOSIS — I480 Paroxysmal atrial fibrillation: Secondary | ICD-10-CM | POA: Diagnosis not present

## 2018-01-28 NOTE — Progress Notes (Signed)
Carelink Summary Report / Loop Recorder 

## 2018-01-30 ENCOUNTER — Other Ambulatory Visit: Payer: Self-pay | Admitting: Urology

## 2018-01-30 ENCOUNTER — Ambulatory Visit (HOSPITAL_COMMUNITY)
Admission: RE | Admit: 2018-01-30 | Discharge: 2018-01-30 | Disposition: A | Discharge status: Home / Self Care | Payer: 59 | Source: Ambulatory Visit | Attending: Urology | Admitting: Urology

## 2018-01-30 DIAGNOSIS — D49512 Neoplasm of unspecified behavior of left kidney: Secondary | ICD-10-CM

## 2018-01-30 DIAGNOSIS — S32010A Wedge compression fracture of first lumbar vertebra, initial encounter for closed fracture: Secondary | ICD-10-CM

## 2018-01-30 DIAGNOSIS — R911 Solitary pulmonary nodule: Secondary | ICD-10-CM | POA: Diagnosis not present

## 2018-01-30 DIAGNOSIS — M4856XA Collapsed vertebra, not elsewhere classified, lumbar region, initial encounter for fracture: Secondary | ICD-10-CM | POA: Insufficient documentation

## 2018-01-30 HISTORY — DX: Wedge compression fracture of first lumbar vertebra, initial encounter for closed fracture: S32.010A

## 2018-01-30 IMAGING — CR DG CHEST 2V
2 series · 2 of 2 positions shown · non-contrast
Comparison: Cardiac CT [DATE], cardiac CT [DATE]

CLINICAL DATA: Neoplasm of left kidney.

EXAM:
CHEST  2 VIEW

[w chest pa]
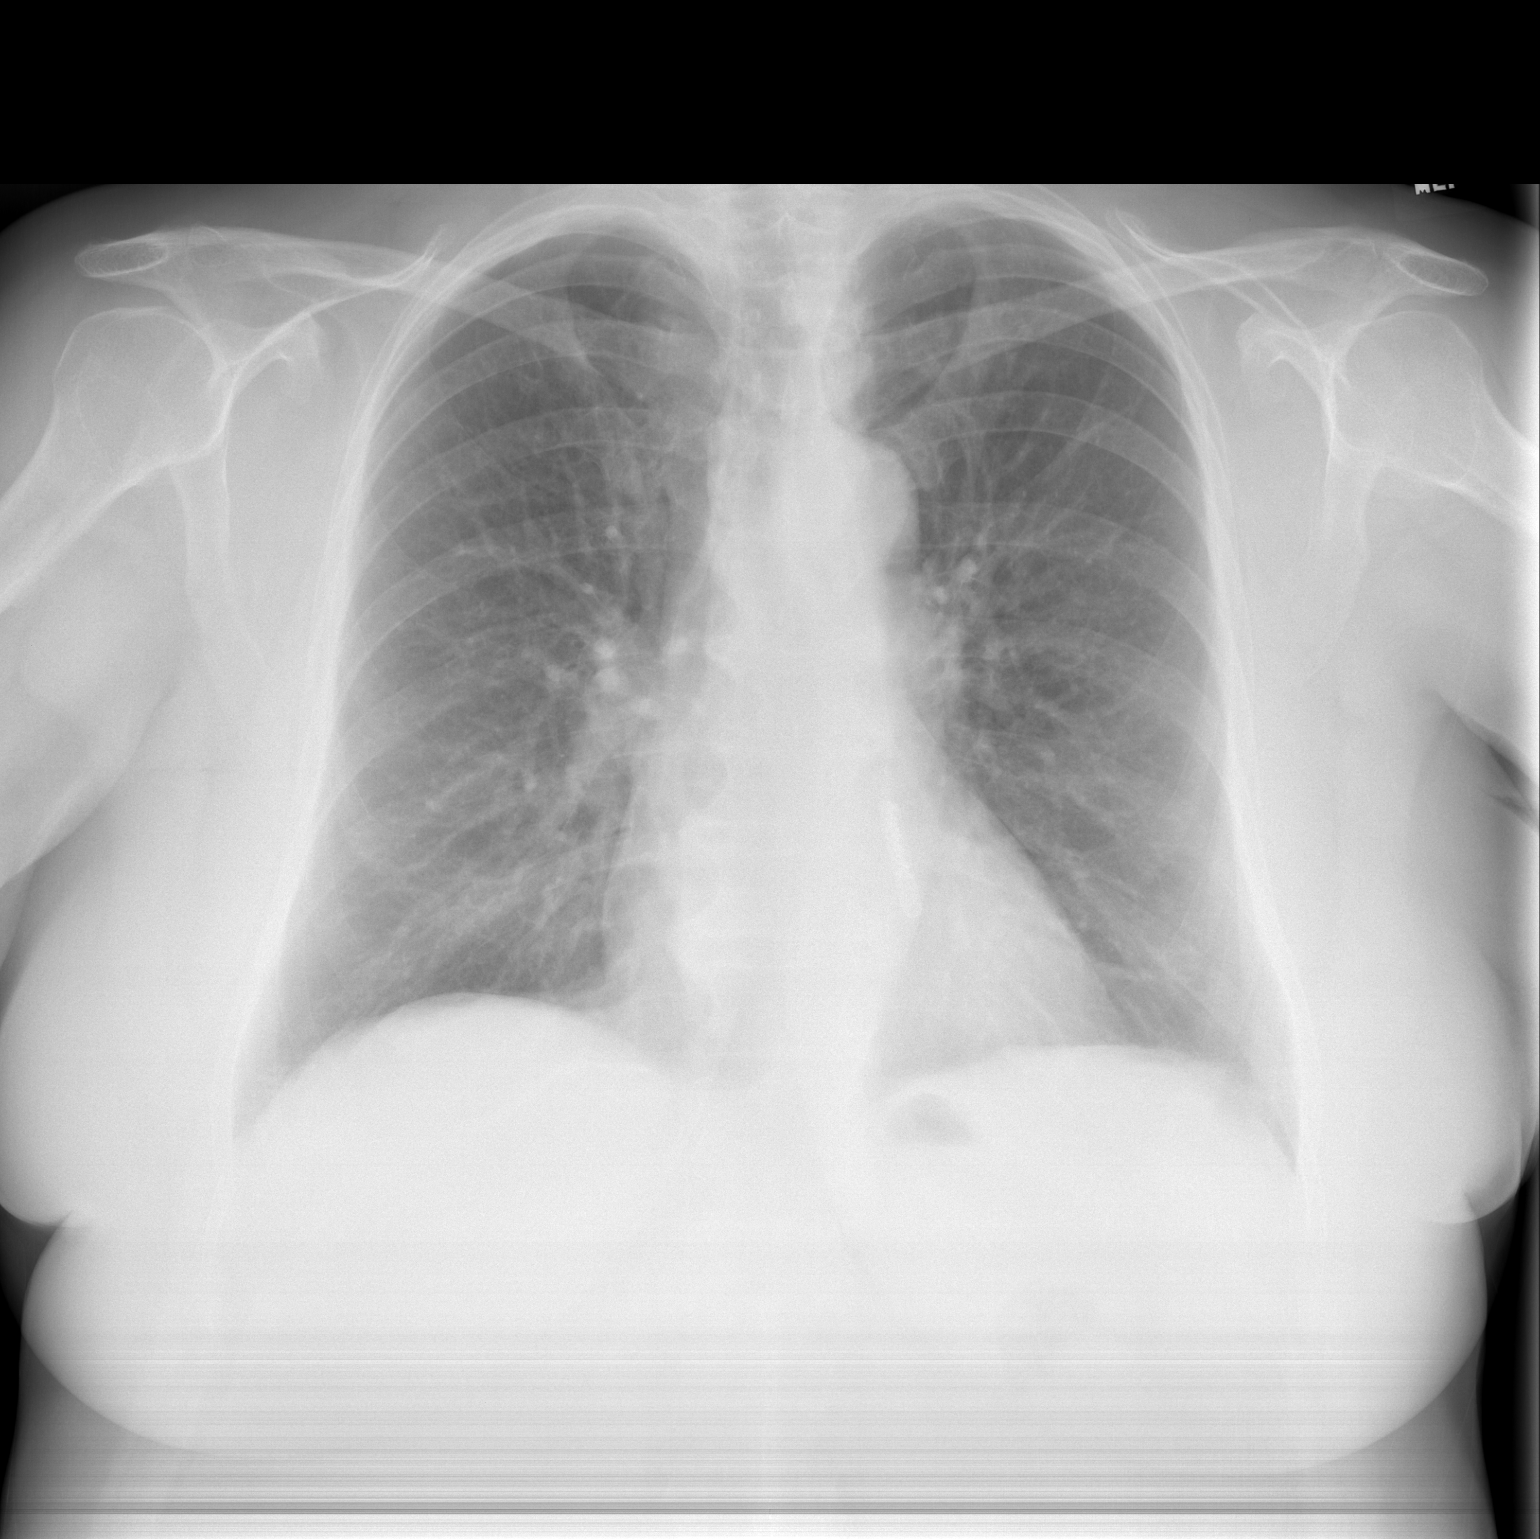

[w chest lat]
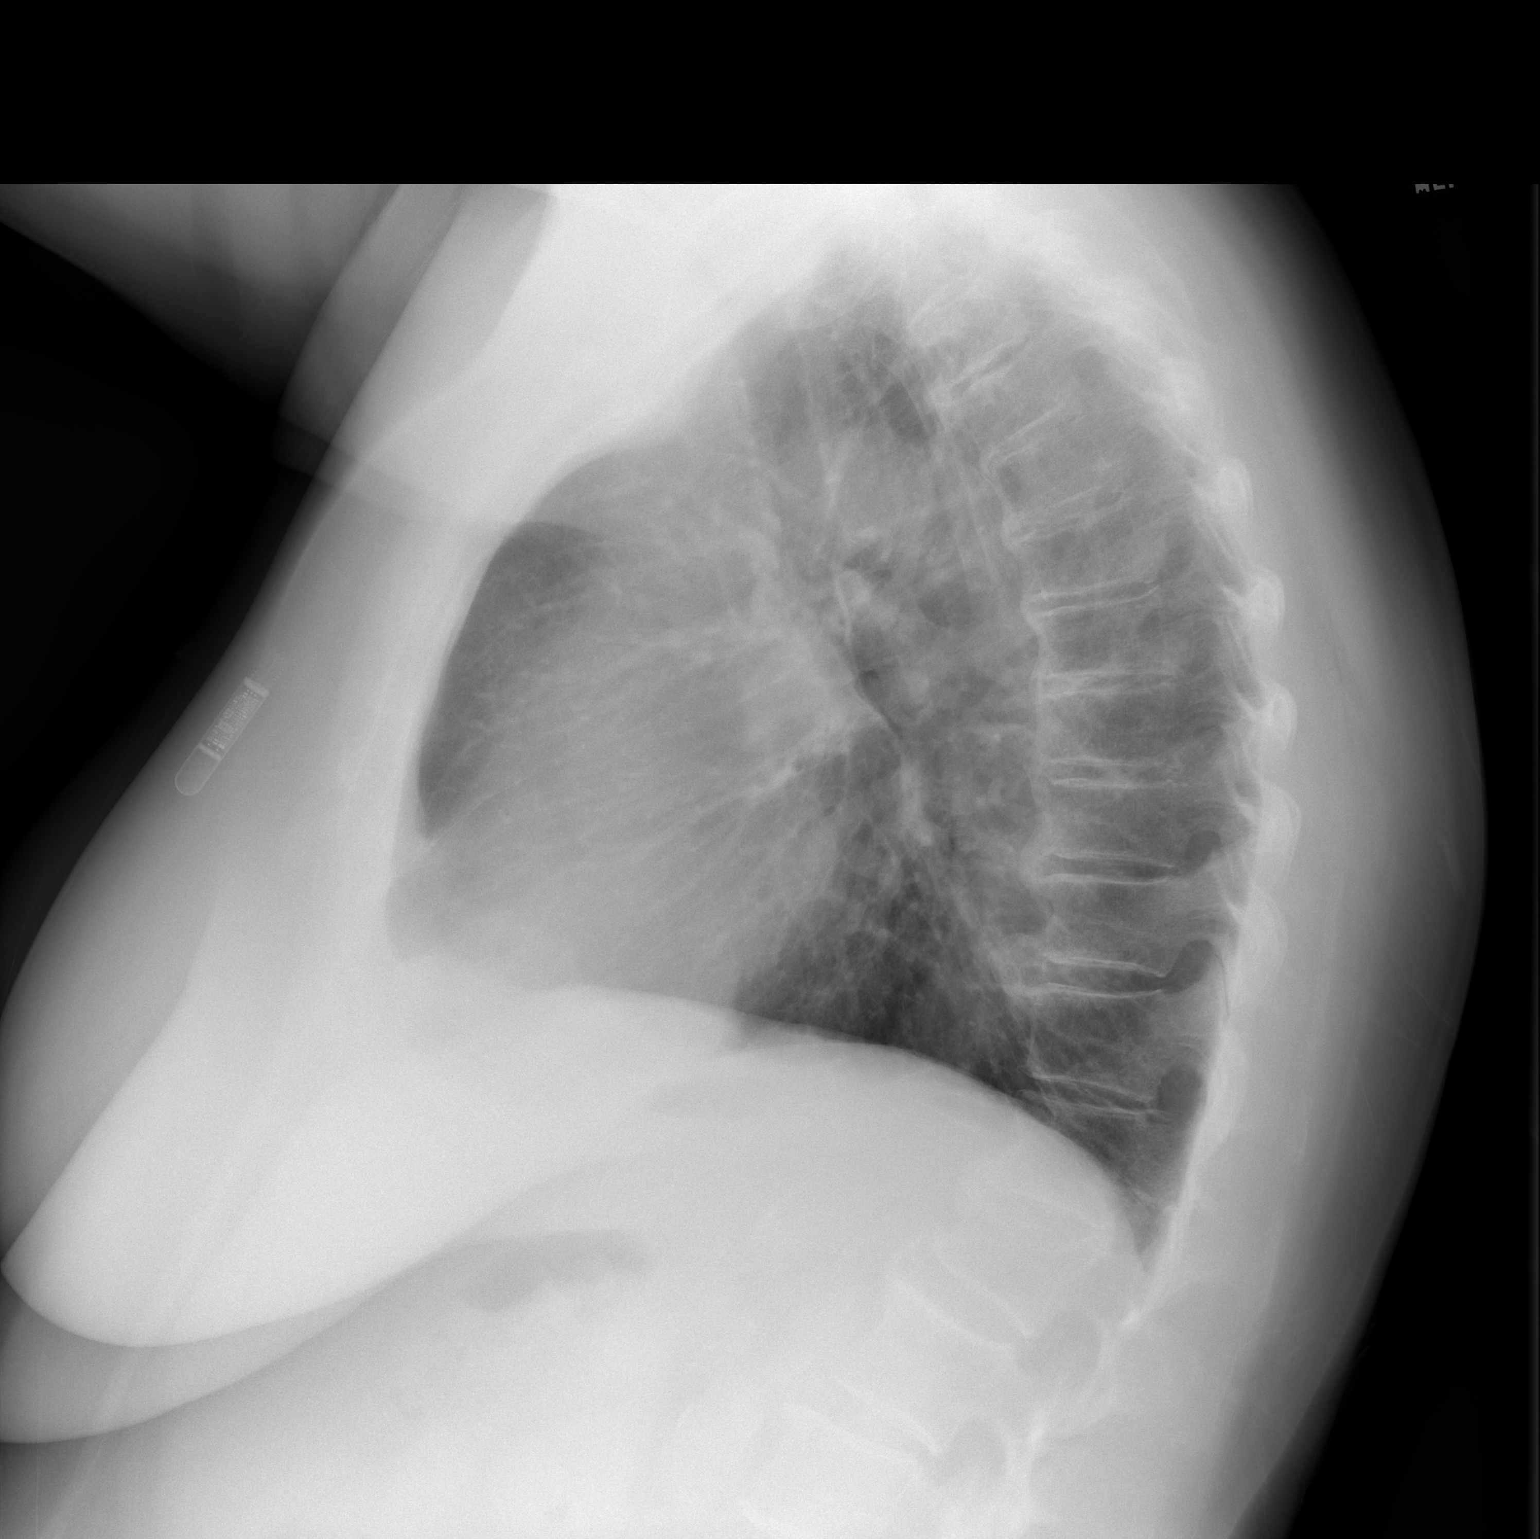

[2 of 2 positions shown; findings below may reference images not displayed]

FINDINGS: No pulmonary mass. The 4 mm pulmonary nodule on prior CT is not seen
radiographically. Implanted loop recorder in the left chest wall.
The cardiomediastinal contours are normal. The lungs are clear.
Pulmonary vasculature is normal. No consolidation, pleural effusion,
or pneumothorax. L1 compression fracture is seen on recent abdominal
CT. Degenerative change in the thoracic spine.
IMPRESSION: 1. No pulmonary mass. Perifissural 4 mm nodule on CT was seen dating
back to [DATE], however not included in the [K3] field of view.
2. The lungs are otherwise clear.
3. L1 compression fracture, as seen on recent CT.

## 2018-02-03 ENCOUNTER — Other Ambulatory Visit: Payer: Self-pay | Admitting: Urology

## 2018-02-03 ENCOUNTER — Telehealth: Payer: Self-pay

## 2018-02-03 NOTE — Telephone Encounter (Signed)
Left a detailed message for [facility] re: pt's cardiac clearance and the date it has been scheduled for.

## 2018-02-03 NOTE — Telephone Encounter (Signed)
   Primary Cardiologist:James Allred, MD  Chart reviewed as part of pre-operative protocol coverage. Patient has h/o PAF, significant sinus bradycardia, HTN, GERD, typical atrial flutter also listed in [place]. H/o afib ablation in 09/2016. Last seen by Dr. Rayann Heman 05/2017 and Roderic Palau 08/2017. Was due to f/u with Dr. Rayann Heman 11/2017 - cancelled this appointment as well as f/u earlier in February. She has a follow-up coming up on 02/12/18 with Dr. Rayann Heman.   Pre-op callback staff, can you please call the requesting surgeon to find out the timeframe in which they plan to do surgery? This appears to tentatively be listed in Epic for 03/07/18 but also says below that surgical scheduling is pending our input. If it is in fact scheduled for 03/07/18, we can also utilize the 02/12/18 overdue follow-up appointment for pre-operative clearance - please add modifier to appointment information. Either you or the surgeon's office can contact the patient to let them know clearance will be addressed at that time, as well as input regarding holding blood thinner.  Charlie Pitter, PA-C 02/03/2018, 3:22 PM

## 2018-02-03 NOTE — Telephone Encounter (Signed)
   [facility] Pre-operative Risk Assessment    Request for surgical clearance:  1. What type of surgery is being performed?    - LEFT Robotic Radical Nephrectomy  2. When is this surgery scheduled?    - Pending Clearance  3. Are there any medications that need to be held prior to surgery and how long?   - Xarelto   - 72 hours prior to surgery  4. Practice name and name of physician performing surgery?    - [facility]   - Dr. Nicolette Bang  5. What is your office phone and fax number?    - Phone: [phone]   - Fax: [phone]  6. Anesthesia type (None, local, MAC, general) ?    - N/A   Velna Ochs 02/03/2018, 10:21 AM  _________________________________________________________________   (provider comments below)

## 2018-02-10 DIAGNOSIS — Z1231 Encounter for screening mammogram for malignant neoplasm of breast: Secondary | ICD-10-CM | POA: Diagnosis not present

## 2018-02-10 DIAGNOSIS — Z803 Family history of malignant neoplasm of breast: Secondary | ICD-10-CM | POA: Diagnosis not present

## 2018-02-10 NOTE — Telephone Encounter (Signed)
[facility] called to confirm pt's surgery is scheduled for march 22.

## 2018-02-10 NOTE — Telephone Encounter (Signed)
Noted patient has appointment for 02/12/18 with Dr. Rayann Heman.   Clearance can be addressed at that visit.   Burtis Junes, RN, [place] [address] [place], [place]  [address] [phone]

## 2018-02-10 NOTE — Telephone Encounter (Signed)
lvm  For surgical scheduler to call back to confirm 3/22. Pre-op already added to appt notes.

## 2018-02-12 ENCOUNTER — Encounter: Payer: Self-pay | Admitting: Internal Medicine

## 2018-02-12 ENCOUNTER — Ambulatory Visit: Payer: 59 | Admitting: Internal Medicine

## 2018-02-12 VITALS — BP 124/64 | HR 50 | Ht 66.0 in | Wt 200.0 lb

## 2018-02-12 DIAGNOSIS — I480 Paroxysmal atrial fibrillation: Secondary | ICD-10-CM | POA: Diagnosis not present

## 2018-02-12 DIAGNOSIS — I10 Essential (primary) hypertension: Secondary | ICD-10-CM | POA: Diagnosis not present

## 2018-02-12 LAB — CUP PACEART INCLINIC DEVICE CHECK
Date Time Interrogation Session: 20190227191640
Implantable Pulse Generator Implant Date: 20160825

## 2018-02-12 NOTE — Progress Notes (Signed)
PCP: Deland Pretty, MD   Primary EP: Dr Barbarann Ehlers is a 67 y.o. female who presents today for routine electrophysiology followup.  Since last being seen in our clinic, the patient reports doing very well.  She has been found to have a renal lesion and is considering surgery for this in March. Today, she denies symptoms of palpitations, chest pain, shortness of breath,  lower extremity edema, dizziness, presyncope, or syncope.  The patient is otherwise without complaint today.   Past Medical History:  Diagnosis Date  . Gait abnormality   . GERD (gastroesophageal reflux disease)   . HTN (hypertension)   . Hyperlipidemia   . Paroxysmal atrial fibrillation (HCC)   . Stress incontinence, female   . Typical atrial flutter [facility])    Past Surgical History:  Procedure Laterality Date  . ANTERIOR AND POSTERIOR VAGINAL REPAIR    . BURCH PROCEDURE    . ELECTROPHYSIOLOGIC STUDY N/A 12/22/2015   CTI and PVI ablation by Dr Rayann Heman  . ELECTROPHYSIOLOGIC STUDY N/A 10/02/2016   Procedure: Atrial Fibrillation Ablation;  Surgeon: [name], MD;  Location: [place] [facility];  Service: Cardiovascular;  Laterality: N/A;  . EP IMPLANTABLE DEVICE N/A 08/11/2015   Procedure: Loop Recorder Insertion;  Surgeon: [name], MD;  Location: [place] [facility];  Service: Cardiovascular;  Laterality: N/A;  . VAGINAL HYSTERECTOMY      ROS- all systems are reviewed and negatives except as per HPI above  Current Outpatient Medications  Medication Sig Dispense Refill  . acetaminophen (TYLENOL) 500 MG tablet Take 500 mg by mouth every 8 (eight) hours as needed for mild pain or moderate pain.    Marland Kitchen atorvastatin (LIPITOR) 20 MG tablet Take 10 mg by mouth daily.     . Calcium Carbonate (CALTRATE 600 PO) Take 600 mg by mouth daily.     . cetirizine (ZYRTEC) 10 MG tablet Take 10 mg by mouth daily.      . Cranberry 405 MG CAPS Take 405 mg by mouth daily.     . fluticasone (FLONASE) 50 MCG/ACT nasal spray  Place 1 spray into both nostrils daily as needed for allergies or rhinitis.    Marland Kitchen losartan-hydrochlorothiazide (HYZAAR) 50-12.5 MG tablet Take 1 tablet by mouth daily.  12  . Multiple Vitamins-Minerals (CENTRUM PO) Take 1 tablet by mouth daily.      . nitrofurantoin, macrocrystal-monohydrate, (MACROBID) 100 MG capsule Take 100 mg by mouth at bedtime.  2  . pantoprazole (PROTONIX) 40 MG tablet Take 1 tablet by mouth daily. 1    . pramipexole (MIRAPEX) 0.125 MG tablet Take 0.25 mg by mouth every evening. 3 hours before bedtime  2  . Probiotic Product (PROBIOTIC PO) Take 1 capsule by mouth daily.    Alveda Reasons 20 MG TABS tablet TAKE 1 TABLET BY MOUTH EVERY DAY WITH SUPPER 30 tablet 9   No current facility-administered medications for this visit.     Physical Exam: Vitals:   02/12/18 1540  Height: 5\' 6"  (1.676 m)    GEN- The patient is well appearing, alert and oriented x 3 today.   Head- normocephalic, atraumatic Eyes-  Sclera clear, conjunctiva pink Ears- hearing intact Oropharynx- clear Lungs- Clear to ausculation bilaterally, normal work of breathing Heart- Regular rate and rhythm, no murmurs, rubs or gallops, PMI not laterally displaced GI- soft, NT, ND, + BS Extremities- no clubbing, cyanosis, or edema  EKG tracing ordered today is personally reviewed and shows sinus rhythm 52 bpm,  PR 174 msec, poor R wave progression  Assessment and Plan:  1. Paroxysmal atrial fibrillation Well controlled ILR is personally reviewed and reveals no afib post ablation chads2vasc score is 4.  Continue xarelto  2. HTN Stable No change required today  3. Sinus bradycardia Asymptomatic  4. preop OK to proceed with renal surgery without further CV workup.  OK to hold xarelto for 48 hours prior to surgery.  Resume when able post operatively  [facility] I will see in a year unless problems arise  [name] MD, [facility] 02/12/2018 3:55 PM

## 2018-02-12 NOTE — Patient Instructions (Signed)
Medication Instructions:  Your physician recommends that you continue on your current medications as directed. Please refer to the Current Medication list given to you today.   Labwork: None ordered  Testing/Procedures: None ordered  Follow-Up: Your physician wants you to follow-up in: 1 year with Dr. Vallery Ridge will receive a reminder letter in the mail two months in advance. If you don't receive a letter, please call our office to schedule the follow-up appointment.   Any Other Special Instructions Will Be Listed Below (If Applicable).     If you need a refill on your cardiac medications before your next appointment, please call your pharmacy.

## 2018-02-24 ENCOUNTER — Telehealth: Payer: Self-pay | Admitting: *Deleted

## 2018-02-24 NOTE — Telephone Encounter (Signed)
   [facility] Pre-operative Risk Assessment    Request for surgical clearance:  1. What type of surgery is being performed? Left Robotic Radical Nephrectomy  2. When is this surgery scheduled? Pending Clearance   3. What type of clearance is required (medical clearance vs. Pharmacy clearance to hold med vs. Both)? Both  4. Are there any medications that need to be held prior to surgery and how long? Xarelto 72 hours prior to surgery  5. Practice name and name of physician performing surgery? [facility], Dr. Nicolette Bang  6. What is your office phone and fax number? P#[phone], [phone]  7. Anesthesia type (None, local, MAC, general) ? Not Stated, Pending Clearance   Jacqueline [name] 02/24/2018, 12:20 PM  _________________________________________________________________   (provider comments below)

## 2018-02-25 DIAGNOSIS — R8271 Bacteriuria: Secondary | ICD-10-CM | POA: Diagnosis not present

## 2018-02-25 DIAGNOSIS — D49512 Neoplasm of unspecified behavior of left kidney: Secondary | ICD-10-CM | POA: Diagnosis not present

## 2018-02-25 LAB — CUP PACEART REMOTE DEVICE CHECK
Date Time Interrogation Session: 20190212015921
Implantable Pulse Generator Implant Date: 20160825

## 2018-02-25 NOTE — Telephone Encounter (Signed)
   Primary Cardiologist: [name], MD  Chart reviewed as part of pre-operative protocol coverage. Given past medical history and time since last visit, based on ACC/AHA guidelines, TARRI GUILFOIL would be at acceptable risk for the planned procedure without further cardiovascular testing.  This is per Dr. Rayann Heman on Visit 02/12/18  OK to hold Xarelto 48 hours before procedure and resume as soon as possible afterwards.    I will route this recommendation to the requesting party via Epic fax function and remove from pre-op pool.  Please call with questions.  Cecilie Kicks, NP 02/25/2018, 4:56 PM

## 2018-02-25 NOTE — Telephone Encounter (Signed)
lmtcb to inform pt of Xarelto instructions.

## 2018-02-28 ENCOUNTER — Encounter (HOSPITAL_COMMUNITY): Payer: Self-pay

## 2018-02-28 NOTE — Patient Instructions (Addendum)
Your procedure is scheduled on: Friday, March 07, 2018   Surgery Time:  7:30AM-10:30AM   Report to [place] by 5:30 AM pick up the phone at the front desk dial [phone], have a seat in the lobby and a staff member will come and escort you to Short Stay Department   Call this number if you have problems the morning of surgery [phone]   BRING CPAP MASK AND TUBING DAY OF SURGERY   The day before surgery liquids only   CLEAR LIQUID DIET   Foods Allowed                                                                     Foods Excluded  Coffee and tea, regular and decaf                             liquids that you cannot  Plain Jell-O in any flavor                                             see through such as: Fruit ices (not with fruit pulp)                                     milk, soups, orange juice  Iced Popsicles                                    All solid food Carbonated beverages, regular and diet                                    Cranberry, grape and apple juices Sports drinks like Gatorade Lightly seasoned clear broth or consume(fat free) Sugar, honey syrup  Sample Menu Breakfast                                Lunch                                     Supper Cranberry juice                    Beef broth                            Chicken broth Jell-O                                     Grape juice  Apple juice Coffee or tea                        Jell-O                                      Popsicle                                                Coffee or tea                        Coffee or tea   Magnesium Citrate:  Drink by noon the day before surgery.   Do not eat drink liquids :After Midnight.   Do NOT smoke after Midnight   Take these medicines the morning of surgery with A SIP OF WATER: Atorvastatin, Zyrtec, Pantoprazole                               You may not have any metal on your body  including hair pins, jewelry, and body piercings             Do not wear make-up, lotions, powders, perfumes/cologne, or deodorant             Do not wear nail polish.  Do not shave  48 hours prior to surgery.       Do not bring valuables to the hospital. [place].   Contacts, dentures or bridgework may not be worn into surgery.   Leave suitcase in the car. After surgery it may be brought to your room.     Special Instructions: Bring a copy of your healthcare power of attorney and living will documents         the day of surgery if you haven't scanned them in before.              Please read over the following fact sheets you were given:  [facility] - Preparing for Surgery Before surgery, you can play an important role.  Because skin is not sterile, your skin needs to be as free of germs as possible.  You can reduce the number of germs on your skin by washing with CHG (chlorahexidine gluconate) soap before surgery.  CHG is an antiseptic cleaner which kills germs and bonds with the skin to continue killing germs even after washing. Please DO NOT use if you have an allergy to CHG or antibacterial soaps.  If your skin becomes reddened/irritated stop using the CHG and inform your nurse when you arrive at Short Stay. Do not shave (including legs and underarms) for at least 48 hours prior to the first CHG shower.  You may shave your face/neck.  Please follow these instructions carefully:  1.  Shower with CHG Soap the night before surgery and the  morning of surgery.  2.  If you choose to wash your hair, wash your hair first as usual with your normal  shampoo.  3.  After you shampoo, rinse your hair and body thoroughly to remove the shampoo.  4.  Use CHG as you would any other liquid soap.  You can apply chg directly to the skin and wash.  Gently with a scrungie or clean washcloth.  5.  Apply the CHG Soap to your body  ONLY FROM THE NECK DOWN.   Do   not use on face/ open                           Wound or open sores. Avoid contact with eyes, ears mouth and   genitals (private parts).                       Wash face,  Genitals (private parts) with your normal soap.             6.  Wash thoroughly, paying special attention to the area where your    surgery  will be performed.  7.  Thoroughly rinse your body with warm water from the neck down.  8.  DO NOT shower/wash with your normal soap after using and rinsing off the CHG Soap.                9.  Pat yourself dry with a clean towel.            10.  Wear clean pajamas.            11.  Place clean sheets on your bed the night of your first shower and do not  sleep with pets. Day of Surgery : Do not apply any lotions/deodorants the morning of surgery.  Please wear clean clothes to the hospital/surgery center.  FAILURE TO FOLLOW THESE INSTRUCTIONS MAY RESULT IN THE CANCELLATION OF YOUR SURGERY  PATIENT SIGNATURE_________________________________  NURSE SIGNATURE__________________________________  ________________________________________________________________________    WHAT IS A BLOOD TRANSFUSION? Blood Transfusion Information  A transfusion is the replacement of blood or some of its parts. Blood is made up of multiple cells which provide different functions.  Red blood cells carry oxygen and are used for blood loss replacement.  White blood cells fight against infection.  Platelets control bleeding.  Plasma helps clot blood.  Other blood products are available for specialized needs, such as hemophilia or other clotting disorders. BEFORE THE TRANSFUSION  Who gives blood for transfusions?   Healthy volunteers who are fully evaluated to make sure their blood is safe. This is blood bank blood. Transfusion therapy is the safest it has ever been in the practice of medicine. Before blood is taken from a donor, a complete history is taken to make sure  that person has no history of diseases nor engages in risky social behavior (examples are intravenous drug use or sexual activity with multiple partners). The donor's travel history is screened to minimize risk of transmitting infections, such as malaria. The donated blood is tested for signs of infectious diseases, such as HIV and hepatitis. The blood is then tested to be sure it is compatible with you in order to minimize the chance of a transfusion reaction. If you or a relative donates blood, this is often done in anticipation of surgery and is not appropriate for emergency situations. It takes many days to process the donated blood. RISKS AND COMPLICATIONS Although transfusion therapy is very safe and saves many lives, the main dangers of transfusion include:   Getting an infectious disease.  Developing a transfusion reaction. This is an allergic reaction to something in the blood you were given.  Every precaution is taken to prevent this. The decision to have a blood transfusion has been considered carefully by your caregiver before blood is given. Blood is not given unless the benefits outweigh the risks. AFTER THE TRANSFUSION  Right after receiving a blood transfusion, you will usually feel much better and more energetic. This is especially true if your red blood cells have gotten low (anemic). The transfusion raises the level of the red blood cells which carry oxygen, and this usually causes an energy increase.  The nurse administering the transfusion will monitor you carefully for complications. HOME CARE INSTRUCTIONS  No special instructions are needed after a transfusion. You may find your energy is better. Speak with your caregiver about any limitations on activity for underlying diseases you may have. SEEK MEDICAL CARE IF:   Your condition is not improving after your transfusion.  You develop redness or irritation at the intravenous (IV) site. SEEK IMMEDIATE MEDICAL CARE IF:  Any of  the following symptoms occur over the next 12 hours:  Shaking chills.  You have a temperature by mouth above 102 F (38.9 C), not controlled by medicine.  Chest, back, or muscle pain.  People around you feel you are not acting correctly or are confused.  Shortness of breath or difficulty breathing.  Dizziness and fainting.  You get a rash or develop hives.  You have a decrease in urine output.  Your urine turns a dark color or changes to pink, red, or brown. Any of the following symptoms occur over the next 10 days:  You have a temperature by mouth above 102 F (38.9 C), not controlled by medicine.  Shortness of breath.  Weakness after normal activity.  The white part of the eye turns yellow (jaundice).  You have a decrease in the amount of urine or are urinating less often.  Your urine turns a dark color or changes to pink, red, or brown. Document Released: 11/30/2000 Document Revised: 02/25/2012 Document Reviewed: 07/19/2008 [facility] Patient Information 2014 [place], [place].  _______________________________________________________________________

## 2018-02-28 NOTE — Telephone Encounter (Signed)
Left message for pt to call back re: Xarelto instructions.

## 2018-02-28 NOTE — Pre-Procedure Instructions (Signed)
The following are in epic: Cardiac Clearance Dr. Rayann Heman 02/12/2018 EKG 02/12/2018 Last device check 02/12/2018 CXR 01/30/2018

## 2018-03-03 ENCOUNTER — Other Ambulatory Visit: Payer: Self-pay

## 2018-03-03 ENCOUNTER — Ambulatory Visit (INDEPENDENT_AMBULATORY_CARE_PROVIDER_SITE_OTHER): Payer: 59 | Admitting: *Deleted

## 2018-03-03 ENCOUNTER — Encounter (HOSPITAL_COMMUNITY): Payer: Self-pay

## 2018-03-03 ENCOUNTER — Encounter (HOSPITAL_COMMUNITY)
Admission: RE | Admit: 2018-03-03 | Discharge: 2018-03-03 | Disposition: A | Discharge status: Home / Self Care | Payer: 59 | Source: Ambulatory Visit | Attending: Urology | Admitting: Urology

## 2018-03-03 DIAGNOSIS — Z01812 Encounter for preprocedural laboratory examination: Secondary | ICD-10-CM | POA: Diagnosis present

## 2018-03-03 DIAGNOSIS — I480 Paroxysmal atrial fibrillation: Secondary | ICD-10-CM

## 2018-03-03 HISTORY — DX: Nausea with vomiting, unspecified: R11.2

## 2018-03-03 HISTORY — DX: Pneumonia, unspecified organism: J18.9

## 2018-03-03 HISTORY — DX: Wedge compression fracture of first lumbar vertebra, initial encounter for closed fracture: S32.010A

## 2018-03-03 HISTORY — DX: Unspecified osteoarthritis, unspecified site: M19.90

## 2018-03-03 HISTORY — DX: Obstructive sleep apnea (adult) (pediatric): G47.33

## 2018-03-03 HISTORY — DX: Prediabetes: R73.03

## 2018-03-03 HISTORY — DX: Fatty (change of) liver, not elsewhere classified: K76.0

## 2018-03-03 HISTORY — DX: Other specified postprocedural states: Z98.890

## 2018-03-03 HISTORY — DX: Rash and other nonspecific skin eruption: R21

## 2018-03-03 HISTORY — DX: Plantar fascial fibromatosis: M72.2

## 2018-03-03 HISTORY — DX: Obstructive sleep apnea (adult) (pediatric): Z99.89

## 2018-03-03 HISTORY — DX: Personal history of other diseases of the digestive system: Z87.19

## 2018-03-03 LAB — BASIC METABOLIC PANEL
Anion gap: 9 (ref 5–15)
BUN: 16 mg/dL (ref 6–20)
CO2: 27 mmol/L (ref 22–32)
Calcium: 9.2 mg/dL (ref 8.9–10.3)
Chloride: 105 mmol/L (ref 101–111)
Creatinine, Ser: 1.1 mg/dL — ABNORMAL HIGH (ref 0.44–1.00)
GFR calc Af Amer: 58 mL/min — ABNORMAL LOW (ref >60)
GFR calc non Af Amer: 50 mL/min — ABNORMAL LOW (ref >60)
Glucose, Bld: 146 mg/dL — ABNORMAL HIGH (ref 65–99)
Potassium: 3.5 mmol/L (ref 3.5–5.1)
Sodium: 141 mmol/L (ref 135–145)

## 2018-03-03 LAB — CBC
HCT: 41.9 % (ref 36.0–46.0)
Hemoglobin: 13.6 g/dL (ref 12.0–15.0)
MCH: 30 pg (ref 26.0–34.0)
MCHC: 32.5 g/dL (ref 30.0–36.0)
MCV: 92.3 fL (ref 78.0–100.0)
Platelets: 212 10*3/uL (ref 150–400)
RBC: 4.54 MIL/uL (ref 3.87–5.11)
RDW: 13.3 % (ref 11.5–15.5)
WBC: 7.1 10*3/uL (ref 4.0–10.5)

## 2018-03-03 LAB — ABO/RH: ABO/RH(D): O POS

## 2018-03-03 LAB — HEMOGLOBIN A1C
Hgb A1c MFr Bld: 6.3 % — ABNORMAL HIGH (ref 4.8–5.6)
Mean Plasma Glucose: 134.11 mg/dL

## 2018-03-03 MED ORDER — MAGNESIUM CITRATE PO SOLN
1.0000 | Freq: Once | ORAL | Status: DC
  Filled 2018-03-03: qty 296

## 2018-03-03 NOTE — Pre-Procedure Instructions (Signed)
BMP and Hgb A 1 C results 03/03/18 faxed to Dr. Alyson Ingles via epic.

## 2018-03-03 NOTE — Progress Notes (Signed)
Carelink Summary Report / Loop Recorder 

## 2018-03-05 NOTE — Telephone Encounter (Signed)
Tried to reach pt re: Xarelto instructions. 4th message left for pt to call back.

## 2018-03-06 NOTE — Telephone Encounter (Signed)
After numerous attempts to reach pt re: Xarelto instructions, I called [facility], Dr. Noland Fordyce office, and left a detailed message on the surgery scheduler's voicemail to reach out to the pt to see if they can give her the instructions.  Will close this encounter.

## 2018-03-06 NOTE — Anesthesia Preprocedure Evaluation (Addendum)
Anesthesia Evaluation  Patient identified by MRN, date of birth, ID band Patient awake    Reviewed: Allergy & Precautions, H&P , NPO status , Patient's Chart, lab work & pertinent test results  History of Anesthesia Complications (+) PONV  Airway Mallampati: III  TM Distance: >3 FB Neck ROM: Full    Dental no notable dental hx. (+) Teeth Intact, Dental Advisory Given   Pulmonary sleep apnea and Continuous Positive Airway Pressure Ventilation ,    Pulmonary exam normal breath sounds clear to auscultation       Cardiovascular Exercise Tolerance: Good hypertension, Pt. on medications + dysrhythmias Atrial Fibrillation  Rhythm:Regular Rate:Normal     Neuro/Psych negative neurological ROS  negative psych ROS   GI/Hepatic Neg liver ROS, hiatal hernia, GERD  Medicated and Controlled,  Endo/Other  negative endocrine ROS  Renal/GU negative Renal ROS  negative genitourinary   Musculoskeletal  (+) Arthritis , Osteoarthritis,    Abdominal   Peds  Hematology negative hematology ROS (+)   Anesthesia Other Findings   Reproductive/Obstetrics negative OB ROS                            Anesthesia Physical Anesthesia Plan  ASA: III  Anesthesia Plan: General   Post-op Pain Management:    Induction: Intravenous  PONV Risk Score and Plan: 4 or greater and Ondansetron, Dexamethasone and Midazolam  Airway Management Planned: Oral ETT  Additional Equipment:   Intra-op Plan:   Post-operative Plan: Extubation in OR  Informed Consent: I have reviewed the patients History and Physical, chart, labs and discussed the procedure including the risks, benefits and alternatives for the proposed anesthesia with the patient or authorized representative who has indicated his/her understanding and acceptance.   Dental advisory given  Plan Discussed with: CRNA  Anesthesia Plan Comments:          Anesthesia Quick Evaluation

## 2018-03-07 ENCOUNTER — Encounter (HOSPITAL_COMMUNITY)
Admission: RE | Disposition: A | Discharge status: Home / Self Care | Payer: Self-pay | Source: Ambulatory Visit | Attending: Urology

## 2018-03-07 ENCOUNTER — Inpatient Hospital Stay (HOSPITAL_COMMUNITY): Payer: 59 | Admitting: Anesthesiology

## 2018-03-07 ENCOUNTER — Other Ambulatory Visit: Payer: Self-pay

## 2018-03-07 ENCOUNTER — Inpatient Hospital Stay (HOSPITAL_COMMUNITY)
Admission: RE | Admit: 2018-03-07 | Discharge: 2018-03-09 | DRG: 661 | Disposition: A | Discharge status: Home / Self Care | Payer: 59 | Source: Ambulatory Visit | Attending: Urology | Admitting: Urology

## 2018-03-07 ENCOUNTER — Encounter (HOSPITAL_COMMUNITY): Payer: Self-pay | Admitting: *Deleted

## 2018-03-07 DIAGNOSIS — K660 Peritoneal adhesions (postprocedural) (postinfection): Secondary | ICD-10-CM | POA: Diagnosis present

## 2018-03-07 DIAGNOSIS — R7303 Prediabetes: Secondary | ICD-10-CM | POA: Diagnosis present

## 2018-03-07 DIAGNOSIS — I10 Essential (primary) hypertension: Secondary | ICD-10-CM | POA: Diagnosis present

## 2018-03-07 DIAGNOSIS — E785 Hyperlipidemia, unspecified: Secondary | ICD-10-CM | POA: Diagnosis present

## 2018-03-07 DIAGNOSIS — K219 Gastro-esophageal reflux disease without esophagitis: Secondary | ICD-10-CM | POA: Diagnosis present

## 2018-03-07 DIAGNOSIS — N2889 Other specified disorders of kidney and ureter: Principal | ICD-10-CM | POA: Diagnosis present

## 2018-03-07 DIAGNOSIS — Z833 Family history of diabetes mellitus: Secondary | ICD-10-CM

## 2018-03-07 DIAGNOSIS — I480 Paroxysmal atrial fibrillation: Secondary | ICD-10-CM | POA: Diagnosis present

## 2018-03-07 DIAGNOSIS — K760 Fatty (change of) liver, not elsewhere classified: Secondary | ICD-10-CM | POA: Diagnosis present

## 2018-03-07 DIAGNOSIS — C642 Malignant neoplasm of left kidney, except renal pelvis: Secondary | ICD-10-CM | POA: Diagnosis not present

## 2018-03-07 DIAGNOSIS — Z8249 Family history of ischemic heart disease and other diseases of the circulatory system: Secondary | ICD-10-CM | POA: Diagnosis not present

## 2018-03-07 HISTORY — PX: ROBOT ASSISTED LAPAROSCOPIC NEPHRECTOMY: SHX5140

## 2018-03-07 LAB — TYPE AND SCREEN
ABO/RH(D): O POS
Antibody Screen: NEGATIVE

## 2018-03-07 LAB — HEMOGLOBIN AND HEMATOCRIT, BLOOD
HCT: 40.3 % (ref 36.0–46.0)
Hemoglobin: 13.3 g/dL (ref 12.0–15.0)

## 2018-03-07 LAB — GLUCOSE, CAPILLARY: Glucose-Capillary: 160 mg/dL — ABNORMAL HIGH (ref 65–99)

## 2018-03-07 SURGERY — NEPHRECTOMY, RADICAL, ROBOT-ASSISTED, LAPAROSCOPIC, ADULT
Anesthesia: General | Laterality: Left

## 2018-03-07 MED ORDER — ONDANSETRON HCL 4 MG/2ML IJ SOLN
INTRAMUSCULAR | Status: DC | PRN
  Administered 2018-03-07: 4 mg via INTRAVENOUS

## 2018-03-07 MED ORDER — DEXTROSE-NACL 5-0.45 % IV SOLN
INTRAVENOUS | Status: DC
  Administered 2018-03-07 – 2018-03-08 (×2): via INTRAVENOUS
  Administered 2018-03-09: 75 mL/h via INTRAVENOUS

## 2018-03-07 MED ORDER — SODIUM CHLORIDE 0.9 % IJ SOLN
INTRAMUSCULAR | Status: DC | PRN
  Administered 2018-03-07: 20 mL

## 2018-03-07 MED ORDER — MIDAZOLAM HCL 2 MG/2ML IJ SOLN
INTRAMUSCULAR | Status: AC
  Filled 2018-03-07: qty 2

## 2018-03-07 MED ORDER — PANTOPRAZOLE SODIUM 40 MG PO TBEC
40.0000 mg | DELAYED_RELEASE_TABLET | Freq: Every day | ORAL | Status: DC
  Administered 2018-03-08 – 2018-03-09 (×2): 40 mg via ORAL
  Filled 2018-03-07 (×2): qty 1

## 2018-03-07 MED ORDER — DEXAMETHASONE SODIUM PHOSPHATE 10 MG/ML IJ SOLN
INTRAMUSCULAR | Status: AC
  Filled 2018-03-07: qty 1

## 2018-03-07 MED ORDER — LACTATED RINGERS IR SOLN
Status: DC | PRN
  Administered 2018-03-07: 1

## 2018-03-07 MED ORDER — HYDROMORPHONE HCL 1 MG/ML IJ SOLN
0.2500 mg | INTRAMUSCULAR | Status: DC | PRN
  Administered 2018-03-07 (×2): 0.25 mg via INTRAVENOUS

## 2018-03-07 MED ORDER — LIDOCAINE 2% (20 MG/ML) 5 ML SYRINGE
INTRAMUSCULAR | Status: DC | PRN
  Administered 2018-03-07: 80 mg via INTRAVENOUS

## 2018-03-07 MED ORDER — HYDROMORPHONE HCL 1 MG/ML IJ SOLN
0.5000 mg | INTRAMUSCULAR | Status: DC | PRN
  Administered 2018-03-07 (×2): 0.5 mg via INTRAVENOUS
  Administered 2018-03-09: 1 mg via INTRAVENOUS
  Filled 2018-03-07 (×3): qty 1

## 2018-03-07 MED ORDER — LACTATED RINGERS IV SOLN
INTRAVENOUS | Status: DC
  Administered 2018-03-07 (×2): via INTRAVENOUS

## 2018-03-07 MED ORDER — ONDANSETRON HCL 4 MG/2ML IJ SOLN
4.0000 mg | INTRAMUSCULAR | Status: DC | PRN
  Administered 2018-03-07: 4 mg via INTRAVENOUS
  Filled 2018-03-07: qty 2

## 2018-03-07 MED ORDER — ROCURONIUM BROMIDE 10 MG/ML (PF) SYRINGE
PREFILLED_SYRINGE | INTRAVENOUS | Status: AC
  Filled 2018-03-07: qty 5

## 2018-03-07 MED ORDER — SCOPOLAMINE 1 MG/3DAYS TD PT72
MEDICATED_PATCH | TRANSDERMAL | Status: DC | PRN
  Administered 2018-03-07: 1 via TRANSDERMAL

## 2018-03-07 MED ORDER — SUCCINYLCHOLINE CHLORIDE 200 MG/10ML IV SOSY
PREFILLED_SYRINGE | INTRAVENOUS | Status: AC
  Filled 2018-03-07: qty 10

## 2018-03-07 MED ORDER — SCOPOLAMINE 1 MG/3DAYS TD PT72
MEDICATED_PATCH | TRANSDERMAL | Status: AC
  Filled 2018-03-07: qty 1

## 2018-03-07 MED ORDER — BUPIVACAINE LIPOSOME 1.3 % IJ SUSP
20.0000 mL | Freq: Once | INTRAMUSCULAR | Status: AC
  Administered 2018-03-07: 20 mL
  Filled 2018-03-07: qty 20

## 2018-03-07 MED ORDER — PRAMIPEXOLE DIHYDROCHLORIDE 0.25 MG PO TABS
0.2500 mg | ORAL_TABLET | Freq: Every evening | ORAL | Status: DC
  Administered 2018-03-07 – 2018-03-08 (×2): 0.25 mg via ORAL
  Filled 2018-03-07 (×3): qty 1

## 2018-03-07 MED ORDER — FENTANYL CITRATE (PF) 100 MCG/2ML IJ SOLN
INTRAMUSCULAR | Status: DC | PRN
  Administered 2018-03-07 (×5): 50 ug via INTRAVENOUS

## 2018-03-07 MED ORDER — HYDROMORPHONE HCL 2 MG/ML IJ SOLN
INTRAMUSCULAR | Status: AC
  Filled 2018-03-07: qty 1

## 2018-03-07 MED ORDER — FENTANYL CITRATE (PF) 250 MCG/5ML IJ SOLN
INTRAMUSCULAR | Status: AC
  Filled 2018-03-07: qty 5

## 2018-03-07 MED ORDER — DIPHENHYDRAMINE HCL 50 MG/ML IJ SOLN: 12.5000 mg | Freq: Four times a day (QID) | INTRAMUSCULAR | Status: DC | PRN

## 2018-03-07 MED ORDER — PROPOFOL 10 MG/ML IV BOLUS
INTRAVENOUS | Status: AC
  Filled 2018-03-07: qty 20

## 2018-03-07 MED ORDER — ATORVASTATIN CALCIUM 10 MG PO TABS
10.0000 mg | ORAL_TABLET | Freq: Every day | ORAL | Status: DC
  Administered 2018-03-08 – 2018-03-09 (×2): 10 mg via ORAL
  Filled 2018-03-07 (×2): qty 1

## 2018-03-07 MED ORDER — PROPOFOL 10 MG/ML IV BOLUS
INTRAVENOUS | Status: DC | PRN
  Administered 2018-03-07: 140 mg via INTRAVENOUS

## 2018-03-07 MED ORDER — MIDAZOLAM HCL 5 MG/5ML IJ SOLN
INTRAMUSCULAR | Status: DC | PRN
  Administered 2018-03-07: 1 mg via INTRAVENOUS

## 2018-03-07 MED ORDER — LIDOCAINE 2% (20 MG/ML) 5 ML SYRINGE
INTRAMUSCULAR | Status: AC
  Filled 2018-03-07: qty 5

## 2018-03-07 MED ORDER — DEXAMETHASONE SODIUM PHOSPHATE 10 MG/ML IJ SOLN
INTRAMUSCULAR | Status: DC | PRN
  Administered 2018-03-07: 10 mg via INTRAVENOUS

## 2018-03-07 MED ORDER — GLYCOPYRROLATE 0.2 MG/ML IV SOSY
PREFILLED_SYRINGE | INTRAVENOUS | Status: AC
  Filled 2018-03-07: qty 5

## 2018-03-07 MED ORDER — DIPHENHYDRAMINE HCL 12.5 MG/5ML PO ELIX
12.5000 mg | ORAL_SOLUTION | Freq: Four times a day (QID) | ORAL | Status: DC | PRN
  Filled 2018-03-07 (×2): qty 5

## 2018-03-07 MED ORDER — ONDANSETRON HCL 4 MG/2ML IJ SOLN
INTRAMUSCULAR | Status: AC
  Filled 2018-03-07: qty 2

## 2018-03-07 MED ORDER — STERILE WATER FOR IRRIGATION IR SOLN
Status: DC | PRN
  Administered 2018-03-07: 1000 mL

## 2018-03-07 MED ORDER — HYDROMORPHONE HCL 1 MG/ML IJ SOLN
INTRAMUSCULAR | Status: AC
  Filled 2018-03-07: qty 1

## 2018-03-07 MED ORDER — ROCURONIUM BROMIDE 10 MG/ML (PF) SYRINGE
PREFILLED_SYRINGE | INTRAVENOUS | Status: DC | PRN
  Administered 2018-03-07: 10 mg via INTRAVENOUS
  Administered 2018-03-07: 50 mg via INTRAVENOUS
  Administered 2018-03-07: 10 mg via INTRAVENOUS

## 2018-03-07 MED ORDER — CEFAZOLIN SODIUM-DEXTROSE 2-4 GM/100ML-% IV SOLN
2.0000 g | INTRAVENOUS | Status: AC
  Administered 2018-03-07: 2 g via INTRAVENOUS
  Filled 2018-03-07: qty 100

## 2018-03-07 MED ORDER — OXYCODONE HCL 5 MG PO TABS
5.0000 mg | ORAL_TABLET | ORAL | Status: DC | PRN
  Administered 2018-03-07 – 2018-03-09 (×8): 5 mg via ORAL
  Filled 2018-03-07 (×9): qty 1

## 2018-03-07 MED ORDER — SUGAMMADEX SODIUM 200 MG/2ML IV SOLN
INTRAVENOUS | Status: DC | PRN
  Administered 2018-03-07: 200 mg via INTRAVENOUS

## 2018-03-07 MED ORDER — SODIUM CHLORIDE 0.9 % IJ SOLN
INTRAMUSCULAR | Status: AC
  Filled 2018-03-07: qty 20

## 2018-03-07 MED ORDER — ACETAMINOPHEN 325 MG PO TABS
650.0000 mg | ORAL_TABLET | ORAL | Status: DC | PRN
  Administered 2018-03-08 – 2018-03-09 (×2): 650 mg via ORAL
  Filled 2018-03-07 (×2): qty 2

## 2018-03-07 MED ORDER — GLYCOPYRROLATE 0.2 MG/ML IV SOSY
PREFILLED_SYRINGE | INTRAVENOUS | Status: DC | PRN
  Administered 2018-03-07 (×2): .2 mg via INTRAVENOUS

## 2018-03-07 MED ORDER — HYDROCODONE-ACETAMINOPHEN 5-325 MG PO TABS: 1.0000 | ORAL_TABLET | Freq: Four times a day (QID) | ORAL | 0 refills | Status: DC | PRN

## 2018-03-07 SURGICAL SUPPLY — 58 items
ADH SKN CLS APL DERMABOND .7 (GAUZE/BANDAGES/DRESSINGS) ×1
BAG LAPAROSCOPIC 12 15 PORT 16 (BASKET) IMPLANT
BAG RETRIEVAL 12/15 (BASKET) ×2
CHLORAPREP W/TINT 26ML (MISCELLANEOUS) ×2 IMPLANT
CLIP VESOLOCK LG 6/CT PURPLE (CLIP) ×3 IMPLANT
CLIP VESOLOCK MED LG 6/CT (CLIP) ×2 IMPLANT
CLIP VESOLOCK XL 6/CT (CLIP) ×2 IMPLANT
COVER SURGICAL LIGHT HANDLE (MISCELLANEOUS) ×2 IMPLANT
COVER TIP SHEARS 8 DVNC (MISCELLANEOUS) ×2 IMPLANT
COVER TIP SHEARS 8MM DA VINCI (MISCELLANEOUS) ×2
CUTTER ECHEON FLEX ENDO 45 340 (ENDOMECHANICALS) ×2 IMPLANT
DECANTER SPIKE VIAL GLASS SM (MISCELLANEOUS) ×2 IMPLANT
DERMABOND ADVANCED (GAUZE/BANDAGES/DRESSINGS) ×1
DERMABOND ADVANCED .7 DNX12 (GAUZE/BANDAGES/DRESSINGS) ×1 IMPLANT
DRAPE ARM DVNC X/XI (DISPOSABLE) ×4 IMPLANT
DRAPE COLUMN DVNC XI (DISPOSABLE) ×1 IMPLANT
DRAPE DA VINCI XI ARM (DISPOSABLE) ×4
DRAPE DA VINCI XI COLUMN (DISPOSABLE) ×1
DRAPE INCISE IOBAN 66X45 STRL (DRAPES) ×3 IMPLANT
DRAPE SHEET LG 3/4 BI-LAMINATE (DRAPES) ×2 IMPLANT
ELECT PENCIL ROCKER SW 15FT (MISCELLANEOUS) ×2 IMPLANT
ELECT REM PT RETURN 15FT ADLT (MISCELLANEOUS) ×4 IMPLANT
GLOVE BIO SURGEON STRL SZ 6.5 (GLOVE) ×2 IMPLANT
GLOVE BIO SURGEON STRL SZ8 (GLOVE) ×4 IMPLANT
GLOVE BIOGEL PI IND STRL 8 (GLOVE) ×1 IMPLANT
GLOVE BIOGEL PI INDICATOR 8 (GLOVE) ×1
GOWN STRL REUS W/TWL LRG LVL3 (GOWN DISPOSABLE) ×6 IMPLANT
HEMOSTAT SURGICEL 4X8 (HEMOSTASIS) ×1 IMPLANT
IRRIG SUCT STRYKERFLOW 2 WTIP (MISCELLANEOUS) ×2
IRRIGATION SUCT STRKRFLW 2 WTP (MISCELLANEOUS) ×1 IMPLANT
KIT BASIN OR (CUSTOM PROCEDURE TRAY) ×2 IMPLANT
LOOP VESSEL MAXI BLUE (MISCELLANEOUS) IMPLANT
NDL INSUFFLATION 14GA 120MM (NEEDLE) ×1 IMPLANT
NEEDLE INSUFFLATION 14GA 120MM (NEEDLE) ×2 IMPLANT
POSITIONER SURGICAL ARM (MISCELLANEOUS) ×4 IMPLANT
RELOAD STAPLE 45 2.6 WHT THIN (STAPLE) IMPLANT
RELOAD STAPLE 60 2.6 WHT THN (STAPLE) IMPLANT
RELOAD STAPLER WHITE 60MM (STAPLE) IMPLANT
SEAL CANN UNIV 5-8 DVNC XI (MISCELLANEOUS) ×4 IMPLANT
SEAL XI 5MM-8MM UNIVERSAL (MISCELLANEOUS) ×4
SOLUTION ELECTROLUBE (MISCELLANEOUS) ×4 IMPLANT
SPONGE LAP 4X18 X RAY DECT (DISPOSABLE) IMPLANT
STAPLE ECHEON FLEX 60 POW ENDO (STAPLE) IMPLANT
STAPLE RELOAD 45 WHT (STAPLE) ×2 IMPLANT
STAPLE RELOAD 45MM WHITE (STAPLE) ×4
STAPLER RELOAD WHITE 60MM (STAPLE)
SUT ETHILON 3 0 PS 1 (SUTURE) IMPLANT
SUT MNCRL AB 4-0 PS2 18 (SUTURE) ×4 IMPLANT
SUT PDS AB 1 TP1 96 (SUTURE) ×2 IMPLANT
SUT VIC AB 2-0 SH 27 (SUTURE) ×2
SUT VIC AB 2-0 SH 27X BRD (SUTURE) IMPLANT
SUT VICRYL 0 UR6 27IN ABS (SUTURE) ×4 IMPLANT
TAPE CLOTH 4X10 WHT NS (GAUZE/BANDAGES/DRESSINGS) ×2 IMPLANT
TOWEL OR NON WOVEN STRL DISP B (DISPOSABLE) ×2 IMPLANT
TRAY FOLEY W/METER SILVER 16FR (SET/KITS/TRAYS/PACK) IMPLANT
TRAY LAPAROSCOPIC (CUSTOM PROCEDURE TRAY) ×2 IMPLANT
TROCAR XCEL 12X100 BLDLESS (ENDOMECHANICALS) ×2 IMPLANT
WATER STERILE IRR 1000ML POUR (IV SOLUTION) ×4 IMPLANT

## 2018-03-07 NOTE — Anesthesia Postprocedure Evaluation (Signed)
Anesthesia Post Note  Patient: SHAILAH GIBBINS  Procedure(s) Performed: XI ROBOTIC ASSISTED LAPAROSCOPIC NEPHRECTOMY (Left )     Patient location during evaluation: PACU Anesthesia Type: General Level of consciousness: awake and alert Pain management: pain level controlled Vital Signs Assessment: post-procedure vital signs reviewed and stable Respiratory status: spontaneous breathing, nonlabored ventilation, respiratory function stable and patient connected to nasal cannula oxygen Cardiovascular status: blood pressure returned to baseline and stable Postop Assessment: no apparent nausea or vomiting Anesthetic complications: no    Last Vitals:  Vitals:   03/07/18 1230 03/07/18 1245  BP: 108/60 121/61  Pulse: (!) 43 (!) 56  Resp: 14 14  Temp:    SpO2: 94% 94%    Last Pain:  Vitals:   03/07/18 1330  TempSrc:   PainSc: 3                  Alejah Aristizabal,W. EDMOND

## 2018-03-07 NOTE — Discharge Instructions (Signed)

## 2018-03-07 NOTE — Transfer of Care (Signed)
Immediate Anesthesia Transfer of Care Note  Patient: Jacqueline [name]  Procedure(s) Performed: XI ROBOTIC ASSISTED LAPAROSCOPIC NEPHRECTOMY (Left )  Patient Location: PACU  Anesthesia Type:General  Level of Consciousness: awake, alert , oriented and patient cooperative  Airway & Oxygen Therapy: Patient Spontanous Breathing and Patient connected to face mask oxygen  Post-op Assessment: Report given to RN, Post -op Vital signs reviewed and stable and Patient moving all extremities  Post vital signs: Reviewed and stable  Last Vitals:  Vitals Value Taken Time  BP 145/86 03/07/2018 11:02 AM  Temp    Pulse 56 03/07/2018 11:04 AM  Resp 14 03/07/2018 11:04 AM  SpO2 98 % 03/07/2018 11:04 AM  Vitals shown include unvalidated device data.  Last Pain:  Vitals:   03/07/18 0625  TempSrc:   PainSc: 0-No pain      Patients Stated Pain Goal: 3 (62/44/69 5072)  Complications: No apparent anesthesia complications

## 2018-03-07 NOTE — Op Note (Addendum)
Preoperative diagnosis: Left renal mass  Postop diagnosis: Same  Procedure: 1.  Left robot assisted laparoscopic radical nephrectomy 2.  Lysis of adhesions, moderate   Attending: Nicolette Bang, MD  Assistant: Debbrah Alar, PA  Anesthesia: General  Estimated blood loss: 100 cc  Drains: 16 French Foley catheter  Specimens: Left radical nephrectomy  Antibiotics: ancef  Findings: anterior abdominal wall adhesions requiring 20 minutes to remove. 1 renal artery and vein. The assistant was utilized for suction, retraction and passing instruments  Indications: Patient is a 67 year old with a history of 5 cm left renal mass.  The mass was not amenable to partial nephrectomy.  After discussing treatment options patient decided to proceed with left robot assisted laparoscopic radical nephrectomy.  Procedure in detail: Prior to procedure consent was obtained. Patient was brought to the operating room and briefing was done sure correct patient, correct procedure, correct site.  General anesthesia was in administered patient was placed in the right lateral decubitus position.  a 108 French catheter was placed. their abdomen and flank was then prepped and draped usual sterile fashion.  A Veress needle was used to obtain pneumoperitoneum.  Once pneumoperitoneum was reestablished to 15 mmHg we then placed a 8 mm camera port lateral to the umbilicus at the latera; edge of rectus.  We then proceeded to place 3 more robotic ports. We then placed an assistant port. We then docked the robot.  We then started this dissection by removing a extensive amount of anterior abdominal wall adhesions.  We then dissected along the white line of Toldt.  We then reflected the colon medially.  We then identified the psoas muscle.  Once this was done we traced it down to the iliac vessels and identified the ureter.  Once we identified the gonadal vein and ureter were then traced this to the renal hilum.  The renal vein and  renal artery were skeletonized.  We did we identified one renal vein one renal artery.  Using the [employer] power stapler within ligated the renal artery.  Once this was done we then used a second staple load to ligate the renal vein.  We then used a tissue load to ligate the gonadal vein and the ureter.  Once this was done we then freed the kidney from its lateral and posterior attachments.  We then used a Endo Catch bag to remove the specimen.  Once the specimen was in the Endo Catch bag we then inspected the retroperitoneum and noted no residual bleeding.  We then removed our instruments, undocked the robot, and released the pneumoperitoneum.  We then made a midline incision above the umbilicus to remove the specimen.  Once the specimen was removed we then closed the camera and assistant ports with 0 Vicryl in interrupted fashion.  We then closed the midline incision with looped PDS in a running fashion.  We then closed the overlying skin with 2-0 Vicryl in running fashion.  These skin was then subcuticularly closed with 4-0 Monocryl.  We then placed Dermabond over all the incisions.  This included the procedure which resulted by the patient.  Complications: None  Condition: Stable, x-rayed, transferred to PACU.  Plan: Patient is to be admitted for inpatient stay. The foley catheter will be removed in the morning. They will be started on a clear liquid diet POD#1

## 2018-03-07 NOTE — H&P (Signed)
Urology Admission H&P  Chief Complaint: left renal mass  History of Present Illness: Jacqueline [name] is a 67yo with a left renal mass found on workup for hematuria. The mass is not amenable to partial nephrectomy. No LUTS. No recent hematuria. No fevers/chills/sweats  Past Medical History:  Diagnosis Date  . Arthritis    Spine and Bil hands  . Compression fracture of L1 lumbar vertebra ([place]) 01/30/2018   CXR  . Fatty liver   . Gait abnormality   . GERD (gastroesophageal reflux disease)   . History of hiatal hernia   . HTN (hypertension)   . Hyperlipidemia   . OSA on CPAP   . Paroxysmal atrial fibrillation (HCC)   . Plantar fasciitis, left   . Pneumonia    History of   . PONV (postoperative nausea and vomiting)   . Pre-diabetes   . Skin rash    under bil breast  . Stress incontinence, female   . Typical atrial flutter [facility])    Past Surgical History:  Procedure Laterality Date  . ANTERIOR AND POSTERIOR VAGINAL REPAIR    . BLADDER SURGERY    . BURCH PROCEDURE    . COLONOSCOPY    . ELECTROPHYSIOLOGIC STUDY N/A 12/22/2015   CTI and PVI ablation by Dr Rayann Heman  . ELECTROPHYSIOLOGIC STUDY N/A 10/02/2016   Procedure: Atrial Fibrillation Ablation;  Surgeon: [name], MD;  Location: [facility];  Service: Cardiovascular;  Laterality: N/A;  . EP IMPLANTABLE DEVICE N/A 08/11/2015   Procedure: Loop Recorder Insertion;  Surgeon: [name], MD;  Location: [facility];  Service: Cardiovascular;  Laterality: N/A;  . VAGINAL HYSTERECTOMY      Home Medications:  Current Facility-Administered Medications  Medication Dose Route Frequency Provider Last Rate Last Dose  . bupivacaine liposome (EXPAREL) 1.3 % injection 266 mg  20 mL Infiltration Once Cleon Gustin, MD      . ceFAZolin (ANCEF) IVPB 2g/100 mL premix  2 g Intravenous 30 min Pre-Op Alyson Ingles Candee Furbish, MD      . lactated ringers infusion   Intravenous Continuous Roderic Palau, MD 75 mL/hr at 03/07/18 0700      Facility-Administered Medications Ordered in Other Encounters  Medication Dose Route Frequency Provider Last Rate Last Dose  . scopolamine (TRANSDERM-SCOP) 1 MG/3DAYS    Anesthesia Intra-op Anne Fu, CRNA   1 patch at 03/07/18 5284   Allergies:  Allergies  Allergen Reactions  . Lisinopril     Cough    Family History  Problem Relation Age of Onset  . Breast cancer Mother   . Colon cancer Mother   . Heart block Father        probable  . Bradycardia Father   . Diabetes Paternal Grandmother   . Diabetes Maternal Grandmother   . Heart disease Paternal Uncle    Social History:  reports that she has never smoked. She has never used smokeless tobacco. She reports that she drinks alcohol. She reports that she does not use drugs.  Review of Systems  All other systems reviewed and are negative.   Physical Exam:  Vital signs in last 24 hours: Temp:  [98.3 F (36.8 C)] 98.3 F (36.8 C) (03/22 0557) Pulse Rate:  [58] 58 (03/22 0557) Resp:  [16] 16 (03/22 0557) BP: (118)/(68) 118/68 (03/22 0557) SpO2:  [96 %] 96 % (03/22 0557) Weight:  [90.7 kg (200 lb)] 90.7 kg (200 lb) (03/22 1324) Physical Exam  Constitutional: She is oriented to person, place,  and time. She appears well-developed and well-nourished.  HENT:  Head: Normocephalic and atraumatic.  Eyes: Pupils are equal, round, and reactive to light. EOM are normal.  Neck: Normal range of motion. No thyromegaly present.  Cardiovascular: Normal rate and regular rhythm.  Respiratory: Effort normal. No respiratory distress.  GI: Soft. She exhibits no distension.  Musculoskeletal: Normal range of motion. She exhibits no edema.  Neurological: She is alert and oriented to person, place, and time.  Skin: Skin is warm and dry.  Psychiatric: She has a normal mood and affect. Her behavior is normal. Judgment and thought content normal.    Laboratory Data:  No results found for this or any previous visit (from the past 24  hour(s)). No results found for this or any previous visit (from the past 240 hour(s)). Creatinine: Recent Labs    03/03/18 0843  CREATININE 1.10*   Baseline Creatinine: 1.1  Impression/Assessment:  66yo with left renal mass  Plan:  The risks/benefits/alternatives to left radical nephrectomy was explained to the patient and she understands and wisehs to proceed with surgery  Nicolette Bang 03/07/2018, 8:08 AM

## 2018-03-07 NOTE — Anesthesia Procedure Notes (Signed)
Procedure Name: Intubation Date/Time: 03/07/2018 8:24 AM Performed by: Anne Fu, CRNA Pre-anesthesia Checklist: Patient identified, Emergency Drugs available, Suction available and Patient being monitored Patient Re-evaluated:Patient Re-evaluated prior to induction Oxygen Delivery Method: Circle system utilized Preoxygenation: Pre-oxygenation with 100% oxygen Induction Type: IV induction Ventilation: Oral airway inserted - appropriate to patient size and Mask ventilation without difficulty Laryngoscope Size: Mac and 3 Grade View: Grade I Tube type: Oral Tube size: 7.0 mm Number of attempts: 1 Airway Equipment and Method: Stylet and Oral airway Placement Confirmation: ETT inserted through vocal cords under direct vision,  positive ETCO2 and breath sounds checked- equal and bilateral Secured at: 20 cm Tube secured with: Tape Dental Injury: Teeth and Oropharynx as per pre-operative assessment  Comments: DL with intubation. Initial spasm with mask ventilation relieved by jaw thrust and PPV.

## 2018-03-08 LAB — BASIC METABOLIC PANEL
Anion gap: 10 (ref 5–15)
BUN: 19 mg/dL (ref 6–20)
CO2: 26 mmol/L (ref 22–32)
Calcium: 8.4 mg/dL — ABNORMAL LOW (ref 8.9–10.3)
Chloride: 100 mmol/L — ABNORMAL LOW (ref 101–111)
Creatinine, Ser: 1.76 mg/dL — ABNORMAL HIGH (ref 0.44–1.00)
GFR calc Af Amer: 33 mL/min — ABNORMAL LOW (ref >60)
GFR calc non Af Amer: 29 mL/min — ABNORMAL LOW (ref >60)
Glucose, Bld: 177 mg/dL — ABNORMAL HIGH (ref 65–99)
Potassium: 3.8 mmol/L (ref 3.5–5.1)
Sodium: 136 mmol/L (ref 135–145)

## 2018-03-08 LAB — HEMOGLOBIN AND HEMATOCRIT, BLOOD
HCT: 40.6 % (ref 36.0–46.0)
Hemoglobin: 12.9 g/dL (ref 12.0–15.0)

## 2018-03-08 MED ORDER — HEPARIN SODIUM (PORCINE) 5000 UNIT/ML IJ SOLN
5000.0000 [IU] | Freq: Three times a day (TID) | INTRAMUSCULAR | Status: DC
  Administered 2018-03-08 – 2018-03-09 (×5): 5000 [IU] via SUBCUTANEOUS
  Filled 2018-03-08 (×4): qty 1

## 2018-03-08 NOTE — Progress Notes (Signed)
1 Day Post-Op Subjective: NAE.  Pain controlled. Denies N/V.  Tolerating clears.  No passing flatus yet.  Doing well.  Objective: Vital signs in last 24 hours: Temp:  [97.8 F (36.6 C)-98.9 F (37.2 C)] 98.6 F (37 C) (03/23 1100) Pulse Rate:  [43-66] 53 (03/23 1100) Resp:  [13-18] 16 (03/23 1100) BP: (108-134)/(50-72) 117/57 (03/23 1100) SpO2:  [92 %-100 %] 96 % (03/23 1100) Weight:  [88 kg (194 lb 0.1 oz)] 88 kg (194 lb 0.1 oz) (03/22 1245)  Intake/Output from previous day: 03/22 0701 - 03/23 0700 In: 2927.5 [P.O.:480; I.V.:2347.5; IV Piggyback:100] Out: 785 [Urine:735; Blood:50]  Intake/Output this shift: Total I/O In: 120 [P.O.:120] Out: -   Physical Exam:  General: Alert and oriented CV: RRR, palpable distal pulses Lungs: CTAB, equal chest rise Abdomen: Soft, NTND, no rebound or guarding Incisions: c/d/i GU: Foley draining clear urine Ext: NT, No erythema  Lab Results: Recent Labs    03/07/18 1118 03/08/18 0503  HGB 13.3 12.9  HCT 40.3 40.6   BMET Recent Labs    03/08/18 0503  NA 136  K 3.8  CL 100*  CO2 26  GLUCOSE 177*  BUN 19  CREATININE 1.76*  CALCIUM 8.4*     Studies/Results: No results found.  Assessment/Plan: POD 1 s/p left robotic nephrectomy  -OOBTC and ambulate. Heparin for DVTp -ADAT -d/c Foley -Likely home tomorrow    LOS: 1 day   Ellison Hughs, MD 03/08/2018, 11:21 AM  [facility] Pager: [phone]

## 2018-03-08 NOTE — Progress Notes (Signed)
During report, dayshift RN reported that pt had bedrest orders for activity.  Attending MD's order in EPIC indicates that pt should ambulate night of surgery. When discussing order with patient, she stated that attending MD also told her that she would be ambulating starting POD#1. Therefore, pt kept on bedrest for duration of shift. Will continue to monitor closely. Carnella Guadalajara I

## 2018-03-09 LAB — HEMOGLOBIN AND HEMATOCRIT, BLOOD
HCT: 38.1 % (ref 36.0–46.0)
Hemoglobin: 12.4 g/dL (ref 12.0–15.0)

## 2018-03-09 LAB — BASIC METABOLIC PANEL
Anion gap: 8 (ref 5–15)
BUN: 21 mg/dL — ABNORMAL HIGH (ref 6–20)
CO2: 25 mmol/L (ref 22–32)
Calcium: 8 mg/dL — ABNORMAL LOW (ref 8.9–10.3)
Chloride: 101 mmol/L (ref 101–111)
Creatinine, Ser: 1.64 mg/dL — ABNORMAL HIGH (ref 0.44–1.00)
GFR calc Af Amer: 36 mL/min — ABNORMAL LOW (ref >60)
GFR calc non Af Amer: 31 mL/min — ABNORMAL LOW (ref >60)
Glucose, Bld: 188 mg/dL — ABNORMAL HIGH (ref 65–99)
Potassium: 3.6 mmol/L (ref 3.5–5.1)
Sodium: 134 mmol/L — ABNORMAL LOW (ref 135–145)

## 2018-03-09 MED ORDER — ONDANSETRON HCL 4 MG PO TABS: 4.0000 mg | ORAL_TABLET | Freq: Every day | ORAL | 1 refills | Status: AC | PRN

## 2018-03-09 MED ORDER — BISACODYL 10 MG RE SUPP
10.0000 mg | Freq: Every day | RECTAL | Status: DC | PRN
  Administered 2018-03-09: 10 mg via RECTAL
  Filled 2018-03-09: qty 1

## 2018-03-09 MED ORDER — CETIRIZINE HCL 10 MG PO TABS
10.0000 mg | ORAL_TABLET | Freq: Every day | ORAL | Status: DC
  Administered 2018-03-09: 10 mg via ORAL
  Filled 2018-03-09: qty 1

## 2018-03-09 NOTE — Discharge Summary (Signed)
Date of admission: 03/07/2018  Date of discharge: 03/09/2018  Admission diagnosis: Left renal mass  Discharge diagnosis: Left renal mass   History and Physical: For full details, please see admission history and physical. Briefly, Jacqueline [name] is a 67 y.o. year old patient with a left renal mass and is status post left robotic nephrectomy with Dr. Alyson Ingles on 03/07/2018.   Hospital Course: The patient was monitored on the floor postoperatively.  By postop day 2, the patient was ambulating without difficulty, tolerating a regular diet, passing flatus and voiding on her own.  Physical Exam:  General: Alert and oriented CV: RRR, palpable distal pulses Lungs: CTAB, equal chest rise Abdomen: Soft, NTND, no rebound or guarding Incisions: Clean, dry and intact Ext: NT, No erythema  Laboratory values:  Recent Labs    03/07/18 1118 03/08/18 0503 03/09/18 0439  HGB 13.3 12.9 12.4  HCT 40.3 40.6 38.1   Recent Labs    03/08/18 0503 03/09/18 0439  CREATININE 1.76* 1.64*    Disposition: Home  Discharge instruction: The patient was instructed to be ambulatory but told to refrain from heavy lifting, strenuous activity, or driving.  Discharge medications:  Allergies as of 03/09/2018      Reactions   Lisinopril    Cough      Medication List    STOP taking these medications   CALCIUM 600 + D 600-200 MG-UNIT Tabs Generic drug:  Calcium Carb-Cholecalciferol   CENTRUM PO   Cranberry 405 MG Caps     TAKE these medications   acetaminophen 500 MG tablet Commonly known as:  TYLENOL Take 500 mg by mouth daily as needed for mild pain or moderate pain.   atorvastatin 20 MG tablet Commonly known as:  LIPITOR Take 10 mg by mouth daily.   cetirizine 10 MG tablet Commonly known as:  ZYRTEC Take 10 mg by mouth daily.   estradiol 0.1 MG/GM vaginal cream Commonly known as:  ESTRACE Place 1 Applicatorful vaginally 3 (three) times a week.   fluticasone 50 MCG/ACT nasal  spray Commonly known as:  FLONASE Place 1 spray into both nostrils daily as needed for allergies or rhinitis.   HYDROcodone-acetaminophen 5-325 MG tablet Commonly known as:  NORCO Take 1-2 tablets by mouth every 6 (six) hours as needed for moderate pain or severe pain.   losartan-hydrochlorothiazide 50-12.5 MG tablet Commonly known as:  HYZAAR Take 1 tablet by mouth daily.   nitrofurantoin (macrocrystal-monohydrate) 100 MG capsule Commonly known as:  MACROBID Take 100 mg by mouth at bedtime.   ondansetron 4 MG tablet Commonly known as:  ZOFRAN Take 1 tablet (4 mg total) by mouth daily as needed for nausea or vomiting.   pantoprazole 40 MG tablet Commonly known as:  PROTONIX Take 40 mg by mouth daily.   pramipexole 0.125 MG tablet Commonly known as:  MIRAPEX Take 0.25 mg by mouth every evening. 3 hours before bedtime   PROBIOTIC PO Take 1 capsule by mouth daily.   XARELTO 20 MG Tabs tablet Generic drug:  rivaroxaban TAKE 1 TABLET BY MOUTH EVERY DAY WITH SUPPER       Followup:  Follow-up Information    [name], [name], MD Follow up on 03/18/2018.   Specialty:  Urology Why:  at 2:30 Contact information: [address] [place] [address] [phone]

## 2018-03-09 NOTE — Progress Notes (Signed)
Received report from [facility] and am taking over care for pt.  I agree with her assessment and will continue to monitor for changes.

## 2018-03-09 NOTE — Progress Notes (Signed)
Pt requesting Zyrtec 10mg  PO QD starting now.  Has Benedryl ordered but does not want it b/c it makes her sleepy.  Requesting that I call the MD now and now wait until 7AM.  Dr. Lovena Neighbours on call and notified awaiting response.

## 2018-03-09 NOTE — Progress Notes (Signed)
Patient has had flatus/BM, will proceed with discharge per MD order.  Barbee Shropshire. Brigitte Pulse, RN

## 2018-03-10 ENCOUNTER — Encounter (HOSPITAL_COMMUNITY): Payer: Self-pay | Admitting: Urology

## 2018-03-18 DIAGNOSIS — C642 Malignant neoplasm of left kidney, except renal pelvis: Secondary | ICD-10-CM | POA: Diagnosis not present

## 2018-04-03 ENCOUNTER — Ambulatory Visit (INDEPENDENT_AMBULATORY_CARE_PROVIDER_SITE_OTHER): Payer: 59 | Admitting: *Deleted

## 2018-04-03 DIAGNOSIS — I480 Paroxysmal atrial fibrillation: Secondary | ICD-10-CM

## 2018-04-04 NOTE — Progress Notes (Signed)
Carelink Summary Report / Loop Recorder 

## 2018-04-08 ENCOUNTER — Telehealth: Payer: Self-pay | Admitting: *Deleted

## 2018-04-08 LAB — CUP PACEART REMOTE DEVICE CHECK
Date Time Interrogation Session: 20190317000704
Implantable Pulse Generator Implant Date: 20160825

## 2018-04-08 NOTE — Telephone Encounter (Signed)
Opened in error

## 2018-04-11 DIAGNOSIS — L570 Actinic keratosis: Secondary | ICD-10-CM | POA: Diagnosis not present

## 2018-04-11 DIAGNOSIS — L723 Sebaceous cyst: Secondary | ICD-10-CM | POA: Diagnosis not present

## 2018-04-11 DIAGNOSIS — D225 Melanocytic nevi of trunk: Secondary | ICD-10-CM | POA: Diagnosis not present

## 2018-04-11 DIAGNOSIS — D2261 Melanocytic nevi of right upper limb, including shoulder: Secondary | ICD-10-CM | POA: Diagnosis not present

## 2018-04-30 DIAGNOSIS — R3121 Asymptomatic microscopic hematuria: Secondary | ICD-10-CM | POA: Diagnosis not present

## 2018-05-02 LAB — CUP PACEART REMOTE DEVICE CHECK
Date Time Interrogation Session: 20190419003337
Implantable Pulse Generator Implant Date: 20160825

## 2018-05-06 ENCOUNTER — Ambulatory Visit (INDEPENDENT_AMBULATORY_CARE_PROVIDER_SITE_OTHER): Payer: 59 | Admitting: *Deleted

## 2018-05-06 DIAGNOSIS — I480 Paroxysmal atrial fibrillation: Secondary | ICD-10-CM

## 2018-05-06 NOTE — Progress Notes (Signed)
Carelink Summary Report / Loop Recorder 

## 2018-05-19 ENCOUNTER — Other Ambulatory Visit: Payer: Self-pay | Admitting: Internal Medicine

## 2018-05-19 NOTE — Telephone Encounter (Signed)
Pt is a 67 yr old female who saw Dr Rayann Heman on 02/12/18. Weight was 90.7 Kg at that visit. SCr from [facility] on 03/18/18 was 1.5. CrCl is 51 mL/min, will refill Xarelto 20mg  QD.

## 2018-05-20 DIAGNOSIS — N390 Urinary tract infection, site not specified: Secondary | ICD-10-CM | POA: Diagnosis not present

## 2018-05-20 DIAGNOSIS — B961 Klebsiella pneumoniae [K. pneumoniae] as the cause of diseases classified elsewhere: Secondary | ICD-10-CM | POA: Diagnosis not present

## 2018-05-20 DIAGNOSIS — R300 Dysuria: Secondary | ICD-10-CM | POA: Diagnosis not present

## 2018-05-28 DIAGNOSIS — G4733 Obstructive sleep apnea (adult) (pediatric): Secondary | ICD-10-CM | POA: Diagnosis not present

## 2018-06-02 LAB — CUP PACEART REMOTE DEVICE CHECK
Date Time Interrogation Session: 20190522013851
Implantable Pulse Generator Implant Date: 20160825

## 2018-06-09 ENCOUNTER — Ambulatory Visit (INDEPENDENT_AMBULATORY_CARE_PROVIDER_SITE_OTHER): Payer: 59 | Admitting: *Deleted

## 2018-06-09 DIAGNOSIS — I480 Paroxysmal atrial fibrillation: Secondary | ICD-10-CM

## 2018-06-09 DIAGNOSIS — I10 Essential (primary) hypertension: Secondary | ICD-10-CM | POA: Diagnosis not present

## 2018-06-09 DIAGNOSIS — N390 Urinary tract infection, site not specified: Secondary | ICD-10-CM | POA: Diagnosis not present

## 2018-06-09 DIAGNOSIS — E119 Type 2 diabetes mellitus without complications: Secondary | ICD-10-CM | POA: Diagnosis not present

## 2018-06-09 DIAGNOSIS — E7800 Pure hypercholesterolemia, unspecified: Secondary | ICD-10-CM | POA: Diagnosis not present

## 2018-06-16 DIAGNOSIS — Z0000 Encounter for general adult medical examination without abnormal findings: Secondary | ICD-10-CM | POA: Diagnosis not present

## 2018-06-16 DIAGNOSIS — R7989 Other specified abnormal findings of blood chemistry: Secondary | ICD-10-CM | POA: Diagnosis not present

## 2018-06-16 DIAGNOSIS — Z1212 Encounter for screening for malignant neoplasm of rectum: Secondary | ICD-10-CM | POA: Diagnosis not present

## 2018-06-16 DIAGNOSIS — I4891 Unspecified atrial fibrillation: Secondary | ICD-10-CM | POA: Diagnosis not present

## 2018-06-16 DIAGNOSIS — I10 Essential (primary) hypertension: Secondary | ICD-10-CM | POA: Diagnosis not present

## 2018-06-16 DIAGNOSIS — E119 Type 2 diabetes mellitus without complications: Secondary | ICD-10-CM | POA: Diagnosis not present

## 2018-06-26 DIAGNOSIS — C642 Malignant neoplasm of left kidney, except renal pelvis: Secondary | ICD-10-CM | POA: Diagnosis not present

## 2018-07-08 DIAGNOSIS — C642 Malignant neoplasm of left kidney, except renal pelvis: Secondary | ICD-10-CM | POA: Diagnosis not present

## 2018-07-11 ENCOUNTER — Ambulatory Visit (INDEPENDENT_AMBULATORY_CARE_PROVIDER_SITE_OTHER): Payer: 59 | Admitting: *Deleted

## 2018-07-11 DIAGNOSIS — I480 Paroxysmal atrial fibrillation: Secondary | ICD-10-CM | POA: Diagnosis not present

## 2018-07-14 NOTE — Progress Notes (Signed)
Carelink Summary Report / Loop Recorder 

## 2018-07-15 LAB — CUP PACEART REMOTE DEVICE CHECK
Date Time Interrogation Session: 20190624020950
Implantable Pulse Generator Implant Date: 20160825

## 2018-07-15 NOTE — Progress Notes (Signed)
Loop recorder summary report 

## 2018-07-28 DIAGNOSIS — R300 Dysuria: Secondary | ICD-10-CM | POA: Diagnosis not present

## 2018-07-28 DIAGNOSIS — N390 Urinary tract infection, site not specified: Secondary | ICD-10-CM | POA: Diagnosis not present

## 2018-08-13 ENCOUNTER — Ambulatory Visit (INDEPENDENT_AMBULATORY_CARE_PROVIDER_SITE_OTHER): Payer: 59 | Admitting: *Deleted

## 2018-08-13 DIAGNOSIS — I480 Paroxysmal atrial fibrillation: Secondary | ICD-10-CM | POA: Diagnosis not present

## 2018-08-14 NOTE — Progress Notes (Signed)
Carelink Summary Report / Loop Recorder 

## 2018-08-20 DIAGNOSIS — N183 Chronic kidney disease, stage 3 (moderate): Secondary | ICD-10-CM | POA: Diagnosis not present

## 2018-08-20 DIAGNOSIS — I129 Hypertensive chronic kidney disease with stage 1 through stage 4 chronic kidney disease, or unspecified chronic kidney disease: Secondary | ICD-10-CM | POA: Diagnosis not present

## 2018-08-20 DIAGNOSIS — E1129 Type 2 diabetes mellitus with other diabetic kidney complication: Secondary | ICD-10-CM | POA: Diagnosis not present

## 2018-08-25 LAB — CUP PACEART REMOTE DEVICE CHECK
Date Time Interrogation Session: 20190727021016
Implantable Pulse Generator Implant Date: 20160825

## 2018-08-27 DIAGNOSIS — G4733 Obstructive sleep apnea (adult) (pediatric): Secondary | ICD-10-CM | POA: Diagnosis not present

## 2018-09-05 DIAGNOSIS — E119 Type 2 diabetes mellitus without complications: Secondary | ICD-10-CM | POA: Diagnosis not present

## 2018-09-09 LAB — CUP PACEART REMOTE DEVICE CHECK
Date Time Interrogation Session: 20190829020905
Implantable Pulse Generator Implant Date: 20160825

## 2018-09-15 ENCOUNTER — Ambulatory Visit (INDEPENDENT_AMBULATORY_CARE_PROVIDER_SITE_OTHER): Payer: 59 | Admitting: *Deleted

## 2018-09-15 DIAGNOSIS — I480 Paroxysmal atrial fibrillation: Secondary | ICD-10-CM

## 2018-09-16 NOTE — Progress Notes (Signed)
Carelink Summary Report / Loop Recorder 

## 2018-09-17 LAB — CUP PACEART REMOTE DEVICE CHECK
Date Time Interrogation Session: 20191001023514
Implantable Pulse Generator Implant Date: 20160825

## 2018-10-13 ENCOUNTER — Ambulatory Visit (HOSPITAL_COMMUNITY)
Admission: RE | Admit: 2018-10-13 | Discharge: 2018-10-13 | Disposition: A | Discharge status: Home / Self Care | Payer: 59 | Source: Ambulatory Visit | Attending: Urology | Admitting: Urology

## 2018-10-13 ENCOUNTER — Other Ambulatory Visit: Payer: Self-pay | Admitting: Urology

## 2018-10-13 DIAGNOSIS — M4856XA Collapsed vertebra, not elsewhere classified, lumbar region, initial encounter for fracture: Secondary | ICD-10-CM | POA: Insufficient documentation

## 2018-10-13 DIAGNOSIS — C642 Malignant neoplasm of left kidney, except renal pelvis: Secondary | ICD-10-CM | POA: Insufficient documentation

## 2018-10-13 DIAGNOSIS — J984 Other disorders of lung: Secondary | ICD-10-CM | POA: Diagnosis not present

## 2018-10-13 IMAGING — DX DG CHEST 2V
2 series · 2 of 2 positions shown · non-contrast
Comparison: [DATE]

CLINICAL DATA: History of renal cell carcinoma

EXAM:
CHEST - 2 VIEW

[chest pa]
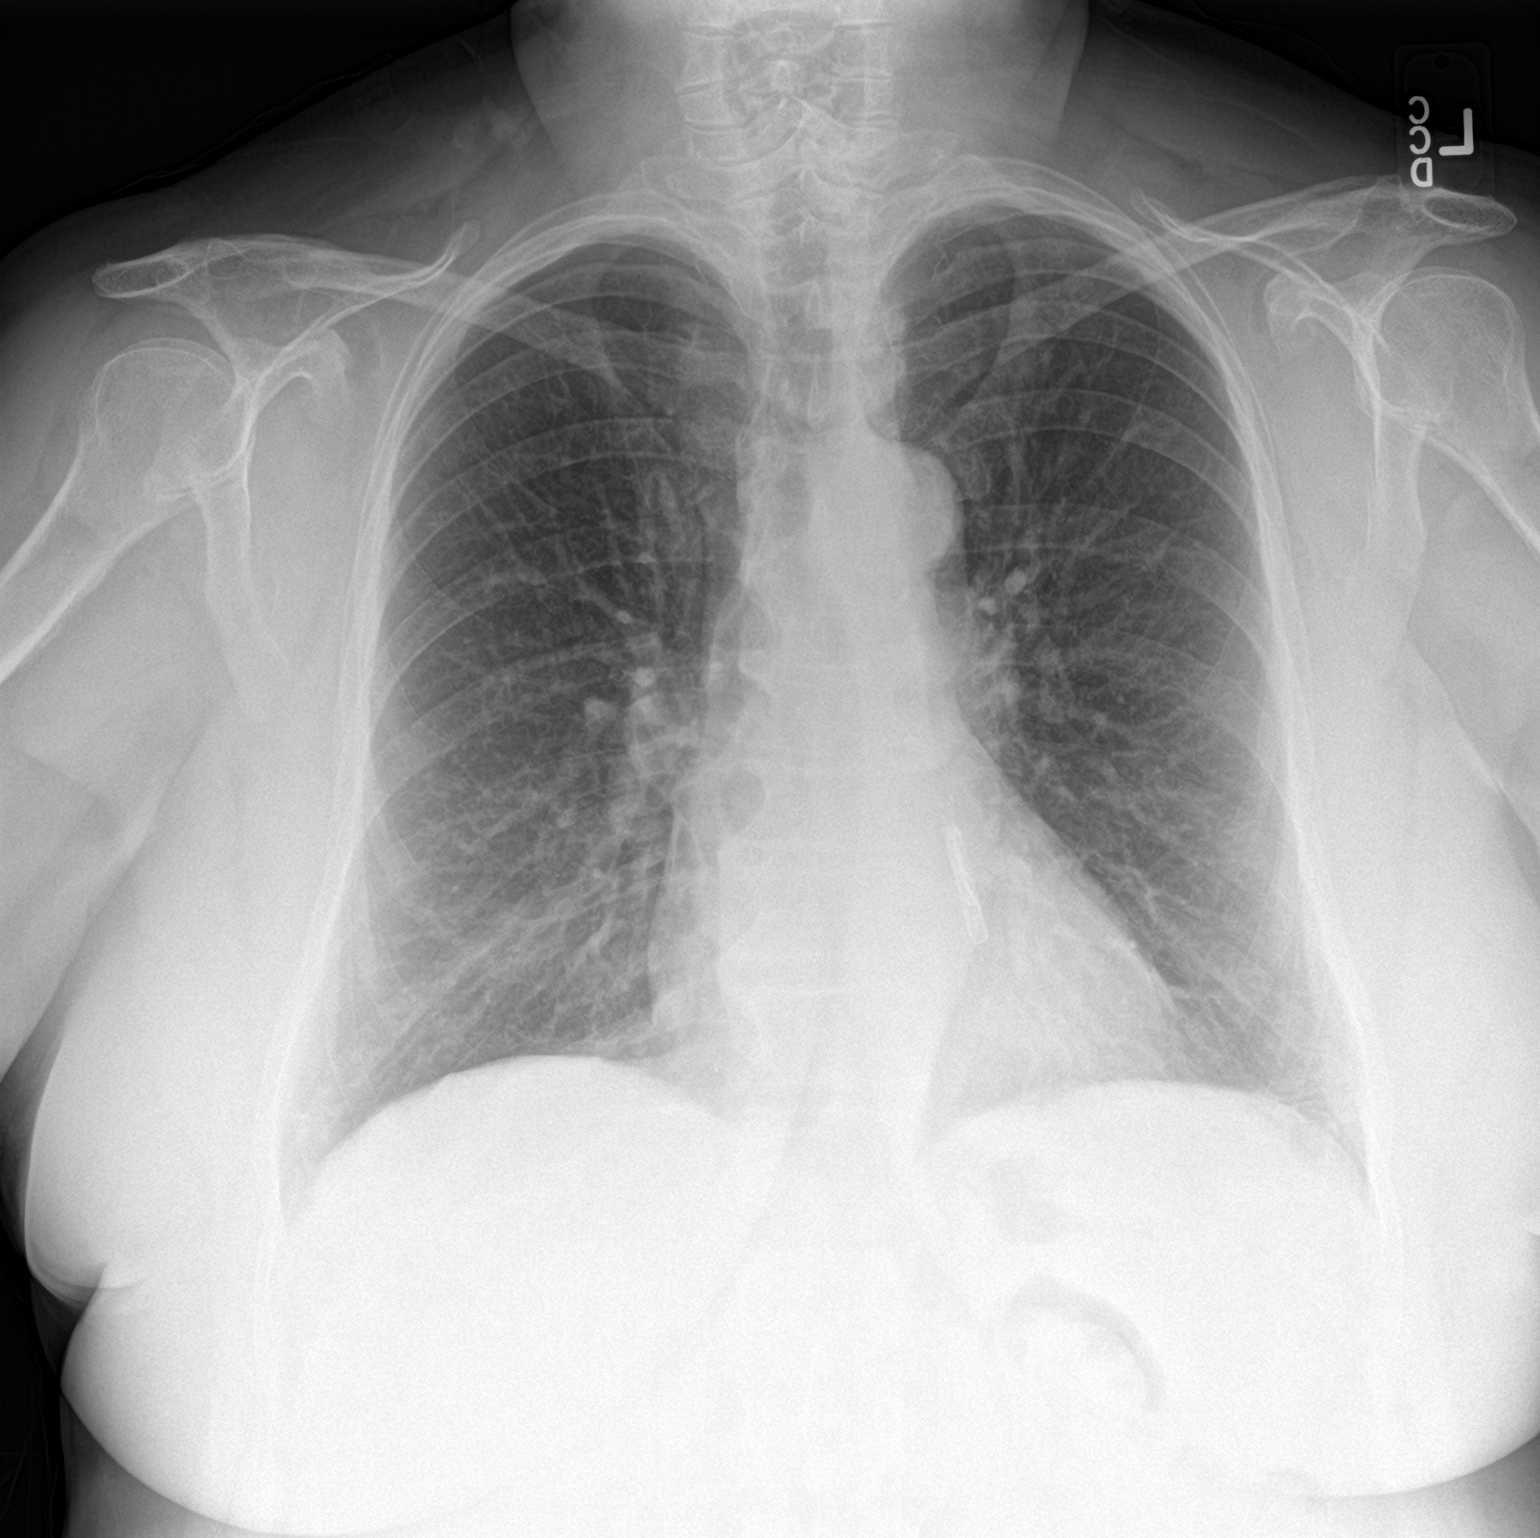

[chest lat]
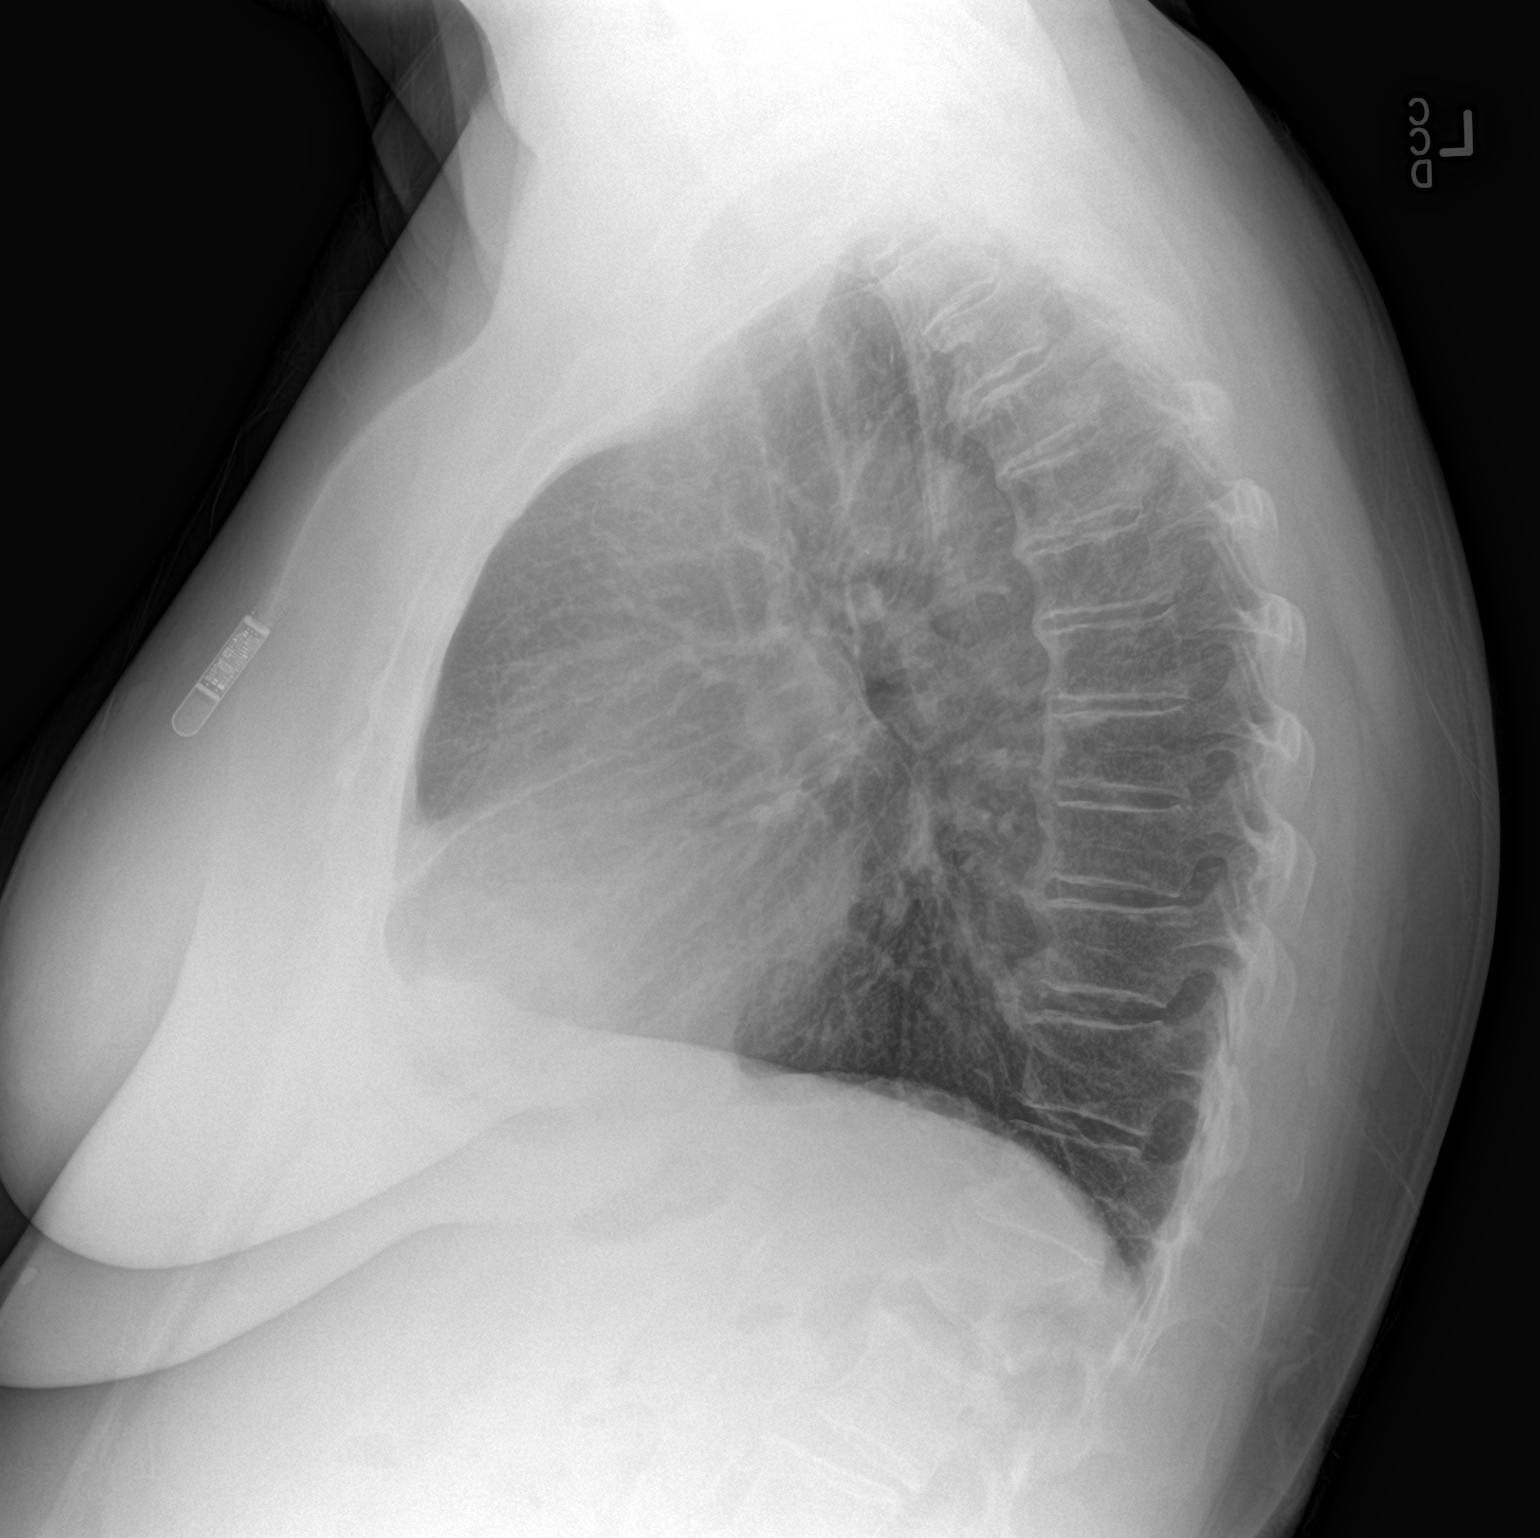

[2 of 2 positions shown; findings below may reference images not displayed]

FINDINGS: There is minimal scarring in the bases. The lungs elsewhere are
clear. Heart size and pulmonary vascularity are normal. No
adenopathy. There is aortic atherosclerosis. There is a loop
recorder anteriorly on the left. There is marked anterior wedging of
the L1 vertebral body, stable. No blastic or lytic bone lesions.
Degenerative changes noted in the thoracic spine.
IMPRESSION: No evident neoplastic focus. Lungs clear. No adenopathy. Marked
wedging of the L1 vertebral body is stable.

## 2018-10-15 DIAGNOSIS — C642 Malignant neoplasm of left kidney, except renal pelvis: Secondary | ICD-10-CM | POA: Diagnosis not present

## 2018-10-15 DIAGNOSIS — N390 Urinary tract infection, site not specified: Secondary | ICD-10-CM | POA: Diagnosis not present

## 2018-10-15 DIAGNOSIS — C649 Malignant neoplasm of unspecified kidney, except renal pelvis: Secondary | ICD-10-CM | POA: Diagnosis not present

## 2018-10-15 DIAGNOSIS — K8020 Calculus of gallbladder without cholecystitis without obstruction: Secondary | ICD-10-CM | POA: Diagnosis not present

## 2018-10-15 DIAGNOSIS — B9620 Unspecified Escherichia coli [E. coli] as the cause of diseases classified elsewhere: Secondary | ICD-10-CM | POA: Diagnosis not present

## 2018-10-20 ENCOUNTER — Ambulatory Visit (INDEPENDENT_AMBULATORY_CARE_PROVIDER_SITE_OTHER): Payer: 59 | Admitting: *Deleted

## 2018-10-20 DIAGNOSIS — I480 Paroxysmal atrial fibrillation: Secondary | ICD-10-CM

## 2018-10-20 NOTE — Progress Notes (Signed)
Carelink Summary Report / Loop Recorder 

## 2018-10-23 DIAGNOSIS — C642 Malignant neoplasm of left kidney, except renal pelvis: Secondary | ICD-10-CM | POA: Diagnosis not present

## 2018-11-11 LAB — CUP PACEART REMOTE DEVICE CHECK
Date Time Interrogation Session: 20191103024031
Implantable Pulse Generator Implant Date: 20160825

## 2018-11-20 ENCOUNTER — Ambulatory Visit (INDEPENDENT_AMBULATORY_CARE_PROVIDER_SITE_OTHER): Payer: 59

## 2018-11-20 DIAGNOSIS — I480 Paroxysmal atrial fibrillation: Secondary | ICD-10-CM

## 2018-11-21 NOTE — Progress Notes (Signed)
Carelink Summary Report / Loop Recorder 

## 2018-12-08 DIAGNOSIS — E119 Type 2 diabetes mellitus without complications: Secondary | ICD-10-CM | POA: Diagnosis not present

## 2018-12-08 DIAGNOSIS — E7800 Pure hypercholesterolemia, unspecified: Secondary | ICD-10-CM | POA: Diagnosis not present

## 2018-12-19 DIAGNOSIS — E7800 Pure hypercholesterolemia, unspecified: Secondary | ICD-10-CM | POA: Diagnosis not present

## 2018-12-19 DIAGNOSIS — E119 Type 2 diabetes mellitus without complications: Secondary | ICD-10-CM | POA: Diagnosis not present

## 2018-12-19 DIAGNOSIS — Z23 Encounter for immunization: Secondary | ICD-10-CM | POA: Diagnosis not present

## 2018-12-19 DIAGNOSIS — I10 Essential (primary) hypertension: Secondary | ICD-10-CM | POA: Diagnosis not present

## 2018-12-24 ENCOUNTER — Ambulatory Visit (INDEPENDENT_AMBULATORY_CARE_PROVIDER_SITE_OTHER): Payer: 59

## 2018-12-24 DIAGNOSIS — I480 Paroxysmal atrial fibrillation: Secondary | ICD-10-CM

## 2018-12-25 LAB — CUP PACEART REMOTE DEVICE CHECK
Date Time Interrogation Session: 20200108064306
Implantable Pulse Generator Implant Date: 20160825

## 2018-12-25 NOTE — Progress Notes (Signed)
Carelink Summary Report / Loop Recorder 

## 2019-01-04 LAB — CUP PACEART REMOTE DEVICE CHECK
Date Time Interrogation Session: 20191206044031
Implantable Pulse Generator Implant Date: 20160825

## 2019-01-19 DIAGNOSIS — C642 Malignant neoplasm of left kidney, except renal pelvis: Secondary | ICD-10-CM | POA: Diagnosis not present

## 2019-01-26 ENCOUNTER — Ambulatory Visit (INDEPENDENT_AMBULATORY_CARE_PROVIDER_SITE_OTHER): Payer: 59

## 2019-01-26 DIAGNOSIS — R3121 Asymptomatic microscopic hematuria: Secondary | ICD-10-CM | POA: Diagnosis not present

## 2019-01-26 DIAGNOSIS — R55 Syncope and collapse: Secondary | ICD-10-CM | POA: Diagnosis not present

## 2019-01-26 DIAGNOSIS — C642 Malignant neoplasm of left kidney, except renal pelvis: Secondary | ICD-10-CM | POA: Diagnosis not present

## 2019-01-26 LAB — CUP PACEART REMOTE DEVICE CHECK
Date Time Interrogation Session: 20200210104119
Implantable Pulse Generator Implant Date: 20160825

## 2019-02-05 NOTE — Progress Notes (Signed)
Carelink Summary Report / Loop Recorder 

## 2019-02-06 ENCOUNTER — Other Ambulatory Visit: Payer: Self-pay | Admitting: Internal Medicine

## 2019-02-06 NOTE — Telephone Encounter (Signed)
Pt is a 67yof with a wt of 90.7kg, and a cr of 1.64 (03/09/18) with a total Ccr: 46mL/minute. Pt was last seen in the office on 02/12/2018 by Dr. [name]  Pt is requesting 20mg  xarelto but only clears for 15mg  will route to pharm d

## 2019-02-10 MED ORDER — RIVAROXABAN 15 MG PO TABS: 15.0000 mg | ORAL_TABLET | Freq: Every day | ORAL | 1 refills | Status: DC

## 2019-02-10 NOTE — Telephone Encounter (Signed)
Patient made aware that she qualifies for dose reduction. 15mg  tablets sent to pharmacy

## 2019-02-11 ENCOUNTER — Telehealth: Payer: Self-pay | Admitting: Cardiology

## 2019-02-11 NOTE — Telephone Encounter (Signed)
LMOVM for pt to return call. ILR at RRT.

## 2019-02-13 NOTE — Telephone Encounter (Signed)
Spoke w/ pt and informed her that her ILR has reached RRT. Informed her of her options. She would like an appt w/ MD to discuss removal. Pt aware a scheduler will be in touch schedule the appt. Informed her that I would mail a return kit for her to send the monitor back. Pt verbalized understanding.

## 2019-02-18 DIAGNOSIS — I129 Hypertensive chronic kidney disease with stage 1 through stage 4 chronic kidney disease, or unspecified chronic kidney disease: Secondary | ICD-10-CM | POA: Diagnosis not present

## 2019-02-18 DIAGNOSIS — N183 Chronic kidney disease, stage 3 (moderate): Secondary | ICD-10-CM | POA: Diagnosis not present

## 2019-02-18 DIAGNOSIS — E1129 Type 2 diabetes mellitus with other diabetic kidney complication: Secondary | ICD-10-CM | POA: Diagnosis not present

## 2019-02-19 ENCOUNTER — Encounter: Payer: Self-pay | Admitting: Sports Medicine

## 2019-02-19 ENCOUNTER — Other Ambulatory Visit: Payer: Self-pay | Admitting: Internal Medicine

## 2019-02-19 ENCOUNTER — Ambulatory Visit: Payer: 59 | Admitting: Sports Medicine

## 2019-02-19 ENCOUNTER — Ambulatory Visit: Payer: Self-pay

## 2019-02-19 VITALS — BP 110/64 | Ht 66.5 in | Wt 200.0 lb

## 2019-02-19 DIAGNOSIS — M9262 Juvenile osteochondrosis of tarsus, left ankle: Secondary | ICD-10-CM | POA: Diagnosis not present

## 2019-02-19 DIAGNOSIS — S86002A Unspecified injury of left Achilles tendon, initial encounter: Secondary | ICD-10-CM

## 2019-02-19 NOTE — Progress Notes (Signed)
  Jacqueline [name] - 68 y.o. female MRN [id number]  Date of birth: 1951/03/03   Chief Complaint: Left foot pain  HPI:  68 year old female who presents for left foot pain.  Scribes the pain as a dull ache in the lateral portion of her left foot.  She says the pain is not present at rest.  Pain is present with activity.  She is a specifically if she is active, takes a break, and then is active again she will mainly feel the pain.  States that going up stairs tends to also elicit pain.  Has tried Tylenol which has not worked very well for her pain.  Cannot remember any inciting event.  She has never felt this pain before.  Of note patient was seen back in 2012 for plantar fascitis right foot.  She states that this does not feel consistent with that pain.  Past HX; Atrial Fib and S/P ablation Nephrectomy for renal cell carcinoma  ROS:     See HPI  PERTINENT  PMH / PSH FH / / SH:  Past Medical, Surgical, Social, and Family History Reviewed & Updated in the EMR.  Pertinent findings include:  Atrial fibrillation Plantar fasciitis SVT Essential hypertension Hyperlipidemia Renal mass status post nephrectomy  OBJECTIVE: BP 110/64   Ht 5' 6.5" (1.689 m)   Wt 200 lb (90.7 kg)   BMI 31.80 kg/m   Physical Exam:  Vital signs are reviewed.  GEN: Alert and oriented, NAD Pulm: Breathing unlabored PSY: normal mood, congruent affect  MSK: Left foot Inspection: Patient noted to have significant pes cavus bilateral feet Palpation: Patient with tenderness to palpation just lateral to Achilles tendon insertion site.  Also with mild tenderness to palpation over peroneal tendons. Range of motion: Range of motion intact to foot flexion, extension, eversion, inversion, lateral, medial rotation. Stability: Patient with without any signs of ankle instability left foot.  No pain to anterior drawer or talar tilt test. Strength: 5 out of 5 strength in foot flexors, extensors, everters, inverters.  Mild tenderness  in affected area with foot flexion Vascular: Palpable PT DP.  Skin warm and dry.  Cap refill less than 2 seconds. Neuro: Sensation intact all dermatomal distributions left foot.  No deficits appreciated.  Ultrasound performed:Left Achilles tendon   Achilles tendon at upper range of normal for size 0.6cm..  Very small calcification noted.  No fluid noted around tendon or insertion site.  Small bone spur consistent with Haglund's deformity noted just lateral to insertion site.  Peroneal tendons inspected.  No sonographic changes consistent with fluid.  No abnormalities noted within peroneal tendons.  Impression:  Haglund's deformity with posterior heel pain  Ultrasound and interpretation by Wolfgang Phoenix. Fields, MD   ASSESSMENT & PLAN:  1.  Haglund's deformity Given ultrasound and exam findings patient with Haglund's deformity likely causing irritation to left peroneal tendons.  Her notable pes cavus may also be playing a small role. Gave patient Achilles strengthening exercises to do.  Discussed appropriate shoes for patient to wear to help manage her Pes cavus. Make sure to add heel lift to any flat shoe and some arch support.  Consider custom orhtotics if symptoms worsen  Jacqueline Dawn MD PGY-2 Family Medicine Resident  I observed and examined the patient with the resident and agree with assessment and plan.  Note reviewed and modified by me. Ysidro Evert

## 2019-02-19 NOTE — Assessment & Plan Note (Signed)
Given ultrasound and exam findings patient with Haglund's deformity likely causing irritation to left peroneal tendons.  Her notable pes cavus may also be playing a small role. Gave patient Achilles strengthening exercises to do.  Discussed appropriate shoes for patient to wear to help manage her Pes cavus.

## 2019-02-25 DIAGNOSIS — G4733 Obstructive sleep apnea (adult) (pediatric): Secondary | ICD-10-CM | POA: Diagnosis not present

## 2019-03-10 ENCOUNTER — Telehealth: Payer: Self-pay | Admitting: *Deleted

## 2019-03-10 NOTE — Telephone Encounter (Signed)
LVM for patient to call back about Video Visit before 5 pm of possible.  If not able to do so,  no worries.

## 2019-03-10 NOTE — Telephone Encounter (Signed)
Follow up   Patient is returning call about video visits.

## 2019-03-11 ENCOUNTER — Other Ambulatory Visit: Payer: Self-pay

## 2019-03-11 ENCOUNTER — Telehealth (INDEPENDENT_AMBULATORY_CARE_PROVIDER_SITE_OTHER): Payer: 59 | Admitting: Internal Medicine

## 2019-03-11 DIAGNOSIS — I480 Paroxysmal atrial fibrillation: Secondary | ICD-10-CM

## 2019-03-11 DIAGNOSIS — E7800 Pure hypercholesterolemia, unspecified: Secondary | ICD-10-CM

## 2019-03-11 DIAGNOSIS — I10 Essential (primary) hypertension: Secondary | ICD-10-CM

## 2019-03-11 NOTE — Progress Notes (Signed)
Electrophysiology TeleHealth Note   Due to national recommendations of social distancing due to COVID 19, an audio/video telehealth visit is felt to be most appropriate for this patient at this time.  See MyChart message from today for the patient's consent to telehealth for [facility].   Date:  03/11/2019   ID:  Jacqueline [name], DOB 09/11/1951, MRN [id number]  Location: patient's home  Provider location: [address] [place]  Evaluation Performed: Follow-up visit  PCP:  Deland Pretty, MD  Cardiologist:  [name], MD   Chief Complaint:  afib  History of Present Illness:    Jacqueline [name] is a 68 y.o. female who presents via audio/video conferencing for a telehealth visit today.  Since last being seen in our clinic, the patient reports doing very well.  She has been tired recently. Today, she denies symptoms of palpitations, chest pain, shortness of breath,  lower extremity edema, dizziness, presyncope, or syncope.  The patient is otherwise without complaint today.  The patient denies symptoms of fevers, chills, cough, or new SOB worrisome for COVID 19.   Past Medical History:  Diagnosis Date  . Arthritis    Spine and Bil hands  . Compression fracture of L1 lumbar vertebra ([place]) 01/30/2018   CXR  . Fatty liver   . Gait abnormality   . GERD (gastroesophageal reflux disease)   . History of hiatal hernia   . HTN (hypertension)   . Hyperlipidemia   . OSA on CPAP   . Paroxysmal atrial fibrillation (HCC)   . Plantar fasciitis, left   . Pneumonia    History of   . PONV (postoperative nausea and vomiting)   . Pre-diabetes   . Skin rash    under bil breast  . Stress incontinence, female   . Typical atrial flutter [employer]     Past Surgical History:  Procedure Laterality Date  . ANTERIOR AND POSTERIOR VAGINAL REPAIR    . BLADDER SURGERY    . BURCH PROCEDURE    . COLONOSCOPY    . ELECTROPHYSIOLOGIC STUDY N/A 12/22/2015   CTI and PVI ablation by Dr Rayann Heman   . ELECTROPHYSIOLOGIC STUDY N/A 10/02/2016   Procedure: Atrial Fibrillation Ablation;  Surgeon: [name], MD;  Location: [facility];  Service: Cardiovascular;  Laterality: N/A;  . EP IMPLANTABLE DEVICE N/A 08/11/2015   Procedure: Loop Recorder Insertion;  Surgeon: [name], MD;  Location: [facility];  Service: Cardiovascular;  Laterality: N/A;  . ROBOT ASSISTED LAPAROSCOPIC NEPHRECTOMY Left 03/07/2018   Procedure: XI ROBOTIC ASSISTED LAPAROSCOPIC NEPHRECTOMY;  Surgeon: Cleon Gustin, MD;  Location: [facility];  Service: Urology;  Laterality: Left;  Marland Kitchen VAGINAL HYSTERECTOMY      Current Outpatient Medications  Medication Sig Dispense Refill  . acetaminophen (TYLENOL) 500 MG tablet Take 500 mg by mouth daily as needed for mild pain or moderate pain.     Marland Kitchen atorvastatin (LIPITOR) 20 MG tablet Take 10 mg by mouth daily.     . cetirizine (ZYRTEC) 10 MG tablet Take 10 mg by mouth daily.      Marland Kitchen estradiol (ESTRACE) 0.1 MG/GM vaginal cream Place 1 Applicatorful vaginally 3 (three) times a week.    . famotidine (PEPCID) 20 MG tablet Take 20 mg by mouth 2 (two) times daily.    . fluticasone (FLONASE) 50 MCG/ACT nasal spray Place 1 spray into both nostrils daily as needed for allergies or rhinitis.    Marland Kitchen HYDROcodone-acetaminophen (NORCO) 5-325  MG tablet Take 1-2 tablets by mouth every 6 (six) hours as needed for moderate pain or severe pain. 30 tablet 0  . losartan-hydrochlorothiazide (HYZAAR) 50-12.5 MG tablet Take 1 tablet by mouth daily.  12  . pramipexole (MIRAPEX) 0.125 MG tablet Take 0.25 mg by mouth every evening. 3 hours before bedtime  2  . Probiotic Product (PROBIOTIC PO) Take 1 capsule by mouth daily.    . Rivaroxaban (XARELTO) 15 MG TABS tablet Take 1 tablet (15 mg total) by mouth daily with supper. 90 tablet 1  . nitrofurantoin, macrocrystal-monohydrate, (MACROBID) 100 MG capsule Take 100 mg by mouth at bedtime.  2  . pantoprazole (PROTONIX) 40 MG tablet Take 40 mg by  mouth daily.      No current facility-administered medications for this visit.     Allergies:   Lisinopril   Social History:  The patient  reports that she has never smoked. She has never used smokeless tobacco. She reports current alcohol use. She reports that she does not use drugs.   Family History:  The patient's  family history includes Bradycardia in her father; Breast cancer in her mother; Colon cancer in her mother; Diabetes in her maternal grandmother and paternal grandmother; Heart block in her father; Heart disease in her paternal uncle.   ROS:  Please see the history of present illness.   All other systems are personally reviewed and negative.    Exam:    Vital Signs:  BP 128/77   Pulse 63    Well appearing, alert and conversant, regular work of breathing,  good skin color Eyes- anicteric, neuro- grossly intact, skin- no apparent rash or lesions or cyanosis, mouth- oral mucosa is pink   Labs/Other Tests and Data Reviewed:    Recent Labs: No results found for requested labs within last 8760 hours.   Wt Readings from Last 3 Encounters:  02/19/19 200 lb (90.7 kg)  03/07/18 194 lb 0.1 oz (88 kg)  03/03/18 200 lb (90.7 kg)     Other studies personally reviewed: Additional studies/ records that were reviewed today include: my prior office notes,  Review of the above records today demonstrates: as above Prior radiographs: CXR 10/13/18- no neoplastic focus, lungs are clear, aortic atherosclerosis.  Last device remote is reviewed from [facility] PDF dated 01/26/2019 which reveals  no arrhythmias    ASSESSMENT & PLAN:    1.  Paroxysmal atrial fibrillation chads2vasc score is 4.  Continue xarelto Her ILR is at RRT.  We will likely remove on return in a year We discussed apple watch as an options today She would be a good candidate for REACT AF  2. HTN Stable No change required today  3. Aortic atherosclerosis Noted on cxr We discussed today the importance of lipid  management Regular exercise and lifestyle modification No ischemic symptoms  4. COVID 19 screen The patient denies symptoms of COVID 19 at this time.  The importance of social distancing was discussed today.  Follow-up:  Return to see me in a year  Current medicines are reviewed at length with the patient today.   The patient does not have concerns regarding her medicines.  The following changes were made today:  none  Labs/ tests ordered today include:  No orders of the defined types were placed in this encounter.   Patient Risk:  after full review of this patients clinical status, I feel that they are at moderate risk at this time.  Today, I have spent 21 minutes with  the patient with telehealth technology discussing afib .    Army Fossa, MD  03/11/2019 2:33 PM     [place] [place] [address] [phone] (office) [phone] (fax)

## 2019-03-16 ENCOUNTER — Encounter: Payer: 59 | Admitting: Internal Medicine

## 2019-03-17 ENCOUNTER — Other Ambulatory Visit: Payer: Self-pay | Admitting: Internal Medicine

## 2019-04-15 DIAGNOSIS — N3000 Acute cystitis without hematuria: Secondary | ICD-10-CM | POA: Diagnosis not present

## 2019-04-27 DIAGNOSIS — B961 Klebsiella pneumoniae [K. pneumoniae] as the cause of diseases classified elsewhere: Secondary | ICD-10-CM | POA: Diagnosis not present

## 2019-04-27 DIAGNOSIS — N3000 Acute cystitis without hematuria: Secondary | ICD-10-CM | POA: Diagnosis not present

## 2019-04-27 DIAGNOSIS — R300 Dysuria: Secondary | ICD-10-CM | POA: Diagnosis not present

## 2019-04-27 DIAGNOSIS — N390 Urinary tract infection, site not specified: Secondary | ICD-10-CM | POA: Diagnosis not present

## 2019-06-17 ENCOUNTER — Encounter: Payer: Self-pay | Admitting: Internal Medicine

## 2019-08-07 ENCOUNTER — Other Ambulatory Visit: Payer: Self-pay

## 2019-08-07 ENCOUNTER — Other Ambulatory Visit (HOSPITAL_COMMUNITY): Payer: Self-pay | Admitting: Urology

## 2019-08-07 ENCOUNTER — Ambulatory Visit (HOSPITAL_COMMUNITY)
Admission: RE | Admit: 2019-08-07 | Discharge: 2019-08-07 | Disposition: A | Discharge status: Home / Self Care | Payer: Medicare Other | Source: Ambulatory Visit | Attending: Urology | Admitting: Urology

## 2019-08-07 DIAGNOSIS — C642 Malignant neoplasm of left kidney, except renal pelvis: Secondary | ICD-10-CM | POA: Insufficient documentation

## 2019-08-07 IMAGING — CR CHEST - 2 VIEW
2 series · 2 of 2 positions shown · non-contrast
Comparison: [DATE]

CLINICAL DATA: History of left renal cell carcinoma

EXAM:
CHEST - 2 VIEW

[w chest pa]
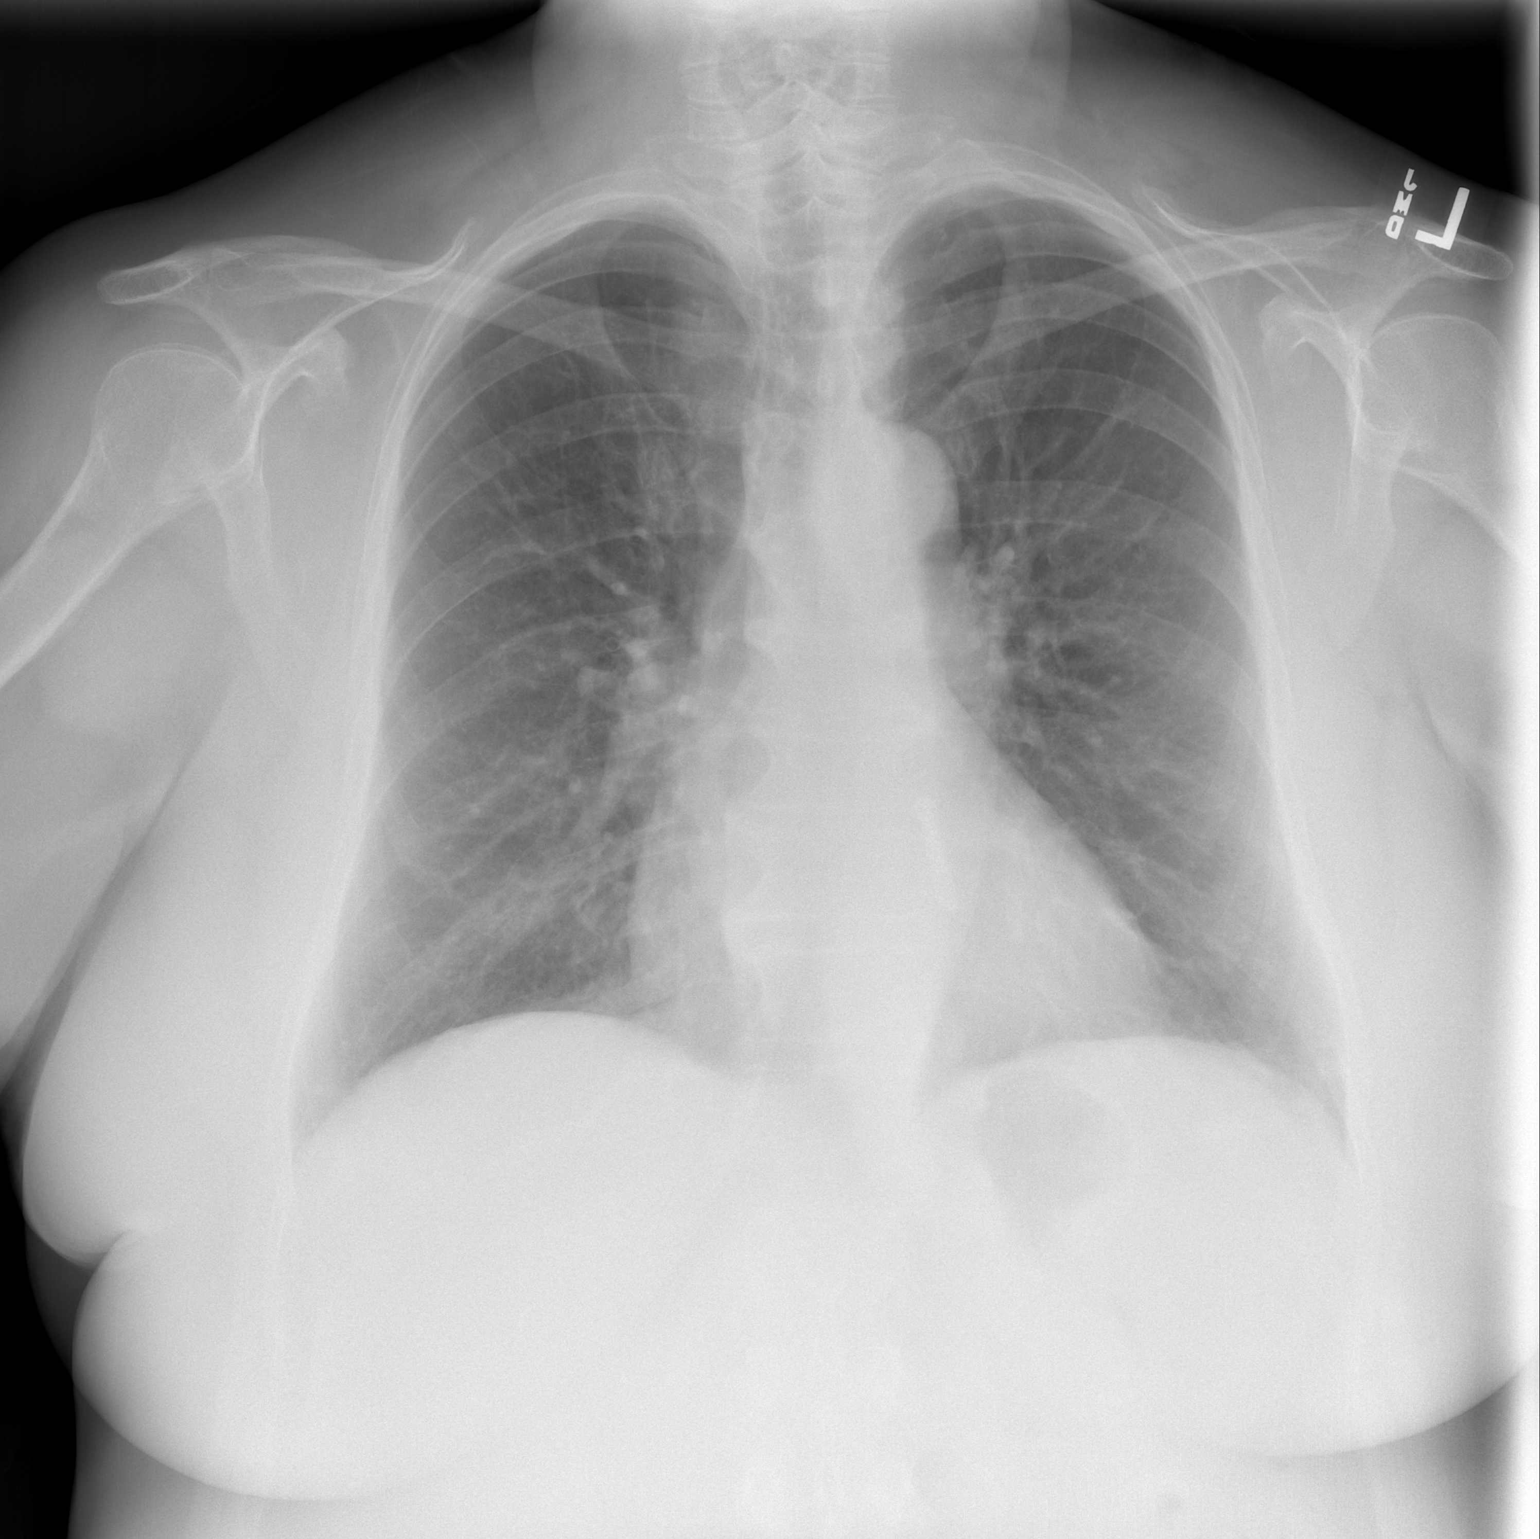

[w chest lat]
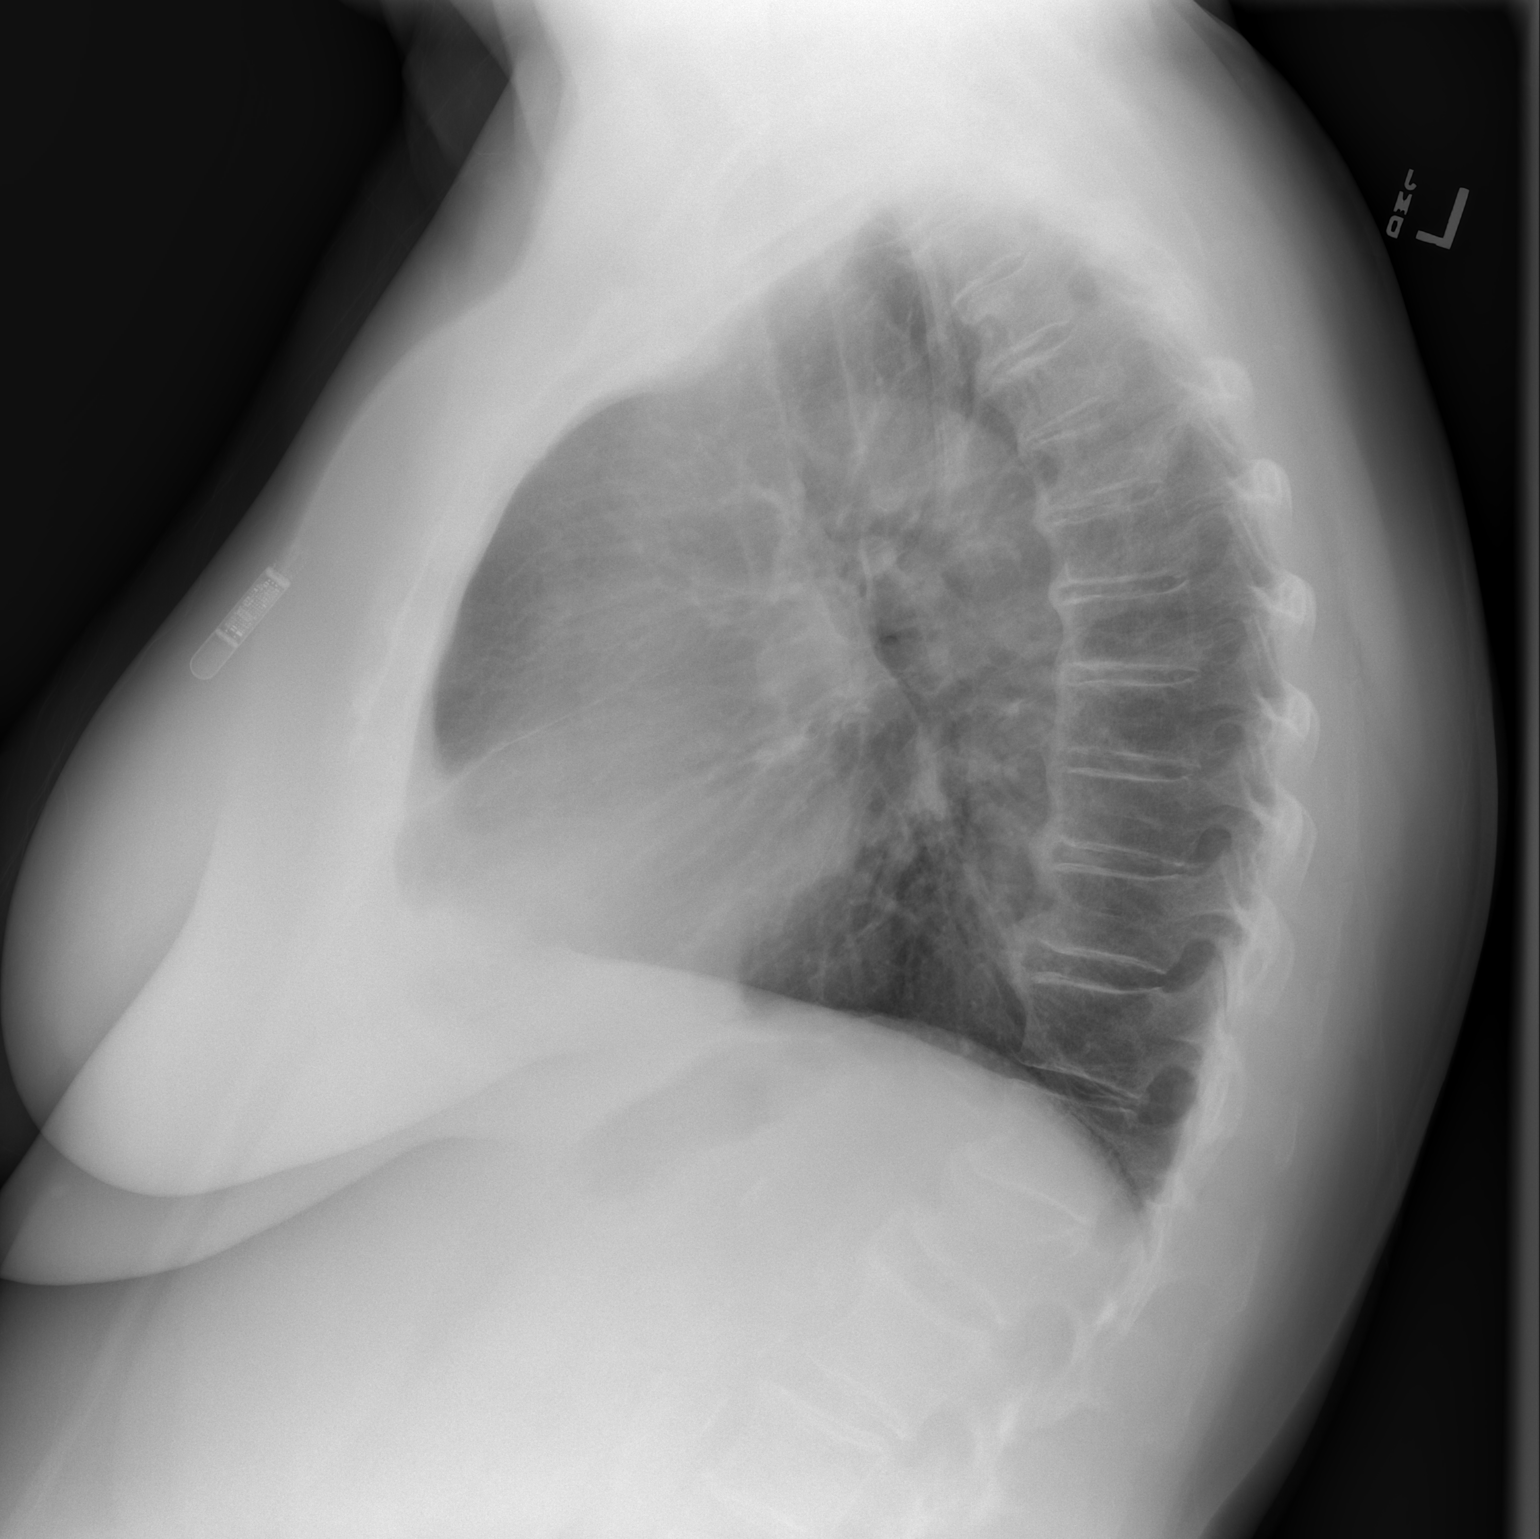

[2 of 2 positions shown; findings below may reference images not displayed]

FINDINGS: Cardiac shadow is stable. The lungs are well aerated bilaterally. No
focal infiltrate or sizable effusion is seen. Loop recorder is
noted. Degenerative changes of the thoracic spine are seen. Chronic
L1 compression deformity is again noted.
IMPRESSION: No acute abnormality seen.

## 2019-08-21 ENCOUNTER — Other Ambulatory Visit: Payer: Self-pay | Admitting: Internal Medicine

## 2019-08-21 NOTE — Telephone Encounter (Signed)
Prescription refill request for Xarelto received.   Last office visit: Dr. Rayann Heman (03-11-2019) Weight: 200 lbs Age: 68 y.o. Scr: 1.53 ( 06-17-2019) CrCl: 49 ml/min  Prescription refill sent.

## 2019-10-05 ENCOUNTER — Other Ambulatory Visit: Payer: Self-pay | Admitting: Pharmacist

## 2019-10-05 MED ORDER — RIVAROXABAN 15 MG PO TABS: ORAL_TABLET | ORAL | 1 refills | Status: DC

## 2019-10-05 NOTE — Progress Notes (Signed)
Pt requested rx go to mail order instead of local pharmacy

## 2019-10-19 ENCOUNTER — Other Ambulatory Visit: Payer: Self-pay

## 2019-10-19 DIAGNOSIS — Z20822 Contact with and (suspected) exposure to covid-19: Secondary | ICD-10-CM

## 2019-10-21 LAB — NOVEL CORONAVIRUS, NAA: SARS-CoV-2, NAA: NOT DETECTED

## 2019-10-22 ENCOUNTER — Telehealth: Payer: Self-pay | Admitting: Internal Medicine

## 2019-10-22 NOTE — Telephone Encounter (Signed)
Patient is calling back for her negaitve COVID test results. Patient expressed understanding.

## 2019-12-30 ENCOUNTER — Telehealth: Payer: Self-pay

## 2019-12-30 NOTE — Telephone Encounter (Signed)
Left message to call back and ask to speak with the pre-op team. 

## 2019-12-30 NOTE — Telephone Encounter (Signed)
   [facility] Pre-operative Risk Assessment    Request for surgical clearance:  1. What type of surgery is being performed? Colonoscopy  2. When is this surgery scheduled? 01/11/20  3. What type of clearance is required (medical clearance vs. Pharmacy clearance to hold med vs. Both)? Both  4. Are there any medications that need to be held prior to surgery and how long? Xarelto  5. Practice name and name of physician performing surgery? [facility], Dr. Therisa Doyne  6. What is your office phone number? [phone]   7.   What is your office fax number? [phone]  8.   Anesthesia type (None, local, MAC, general) ? propofol      12/30/2019, 12:54 PM  _________________________________________________________________   (Tavarus Poteete comments below)

## 2019-12-30 NOTE — Telephone Encounter (Signed)
Pt takes Xarelto for afib with CHADS2VASc score of 4 (age, sex, HTN, DM). SCr 1.59 and 1.7 both reported in scanned labs from 12/21, CrCl 44-103mL/min, pt on appropriately reduced dose of Xarelto. Ok to hold Xarelto for 2 days prior to procedure.

## 2019-12-31 NOTE — Telephone Encounter (Signed)
Left a message on cell phone (called home phone yesterday) to call back and ask to speak to pre-op team.

## 2019-12-31 NOTE — Telephone Encounter (Addendum)
   Primary Cardiologist: [name], MD  Chart reviewed as part of pre-operative protocol coverage. Called and spoke with patient today. She has been doing well since her last virtual visit with Dr. Rayann Heman in 02/2019. No chest pain, shortness of breath, palpitations, lightheadedness, dizziness, syncope, or acute CHF symptoms. She is able to complete >4.0 METS with no anginal symptoms. Given past medical history and time since last visit, based on ACC/AHA guidelines, Jacqueline [name] would be at acceptable risk for the planned procedure without further cardiovascular testing.   Per Pharmacy, "OK to hold Xarelto for 2 days prior to procedure."  I will route this recommendation to the requesting party via [facility] fax function and remove from pre-op pool.  Please call with questions.  Darreld Mclean, PA-C 12/31/2019, 10:59 AM

## 2020-02-05 ENCOUNTER — Other Ambulatory Visit: Payer: Self-pay

## 2020-02-05 ENCOUNTER — Ambulatory Visit (HOSPITAL_COMMUNITY)
Admission: RE | Admit: 2020-02-05 | Discharge: 2020-02-05 | Disposition: A | Discharge status: Home / Self Care | Payer: Medicare Other | Source: Ambulatory Visit | Attending: Urology | Admitting: Urology

## 2020-02-05 ENCOUNTER — Other Ambulatory Visit (HOSPITAL_COMMUNITY): Payer: Self-pay | Admitting: Urology

## 2020-02-05 DIAGNOSIS — C642 Malignant neoplasm of left kidney, except renal pelvis: Secondary | ICD-10-CM | POA: Diagnosis present

## 2020-02-05 IMAGING — CR DG CHEST 2V
2 series · 2 of 2 positions shown · non-contrast
Comparison: [DATE]

CLINICAL DATA: Renal cell carcinoma

EXAM:
CHEST - 2 VIEW

[w chest pa]
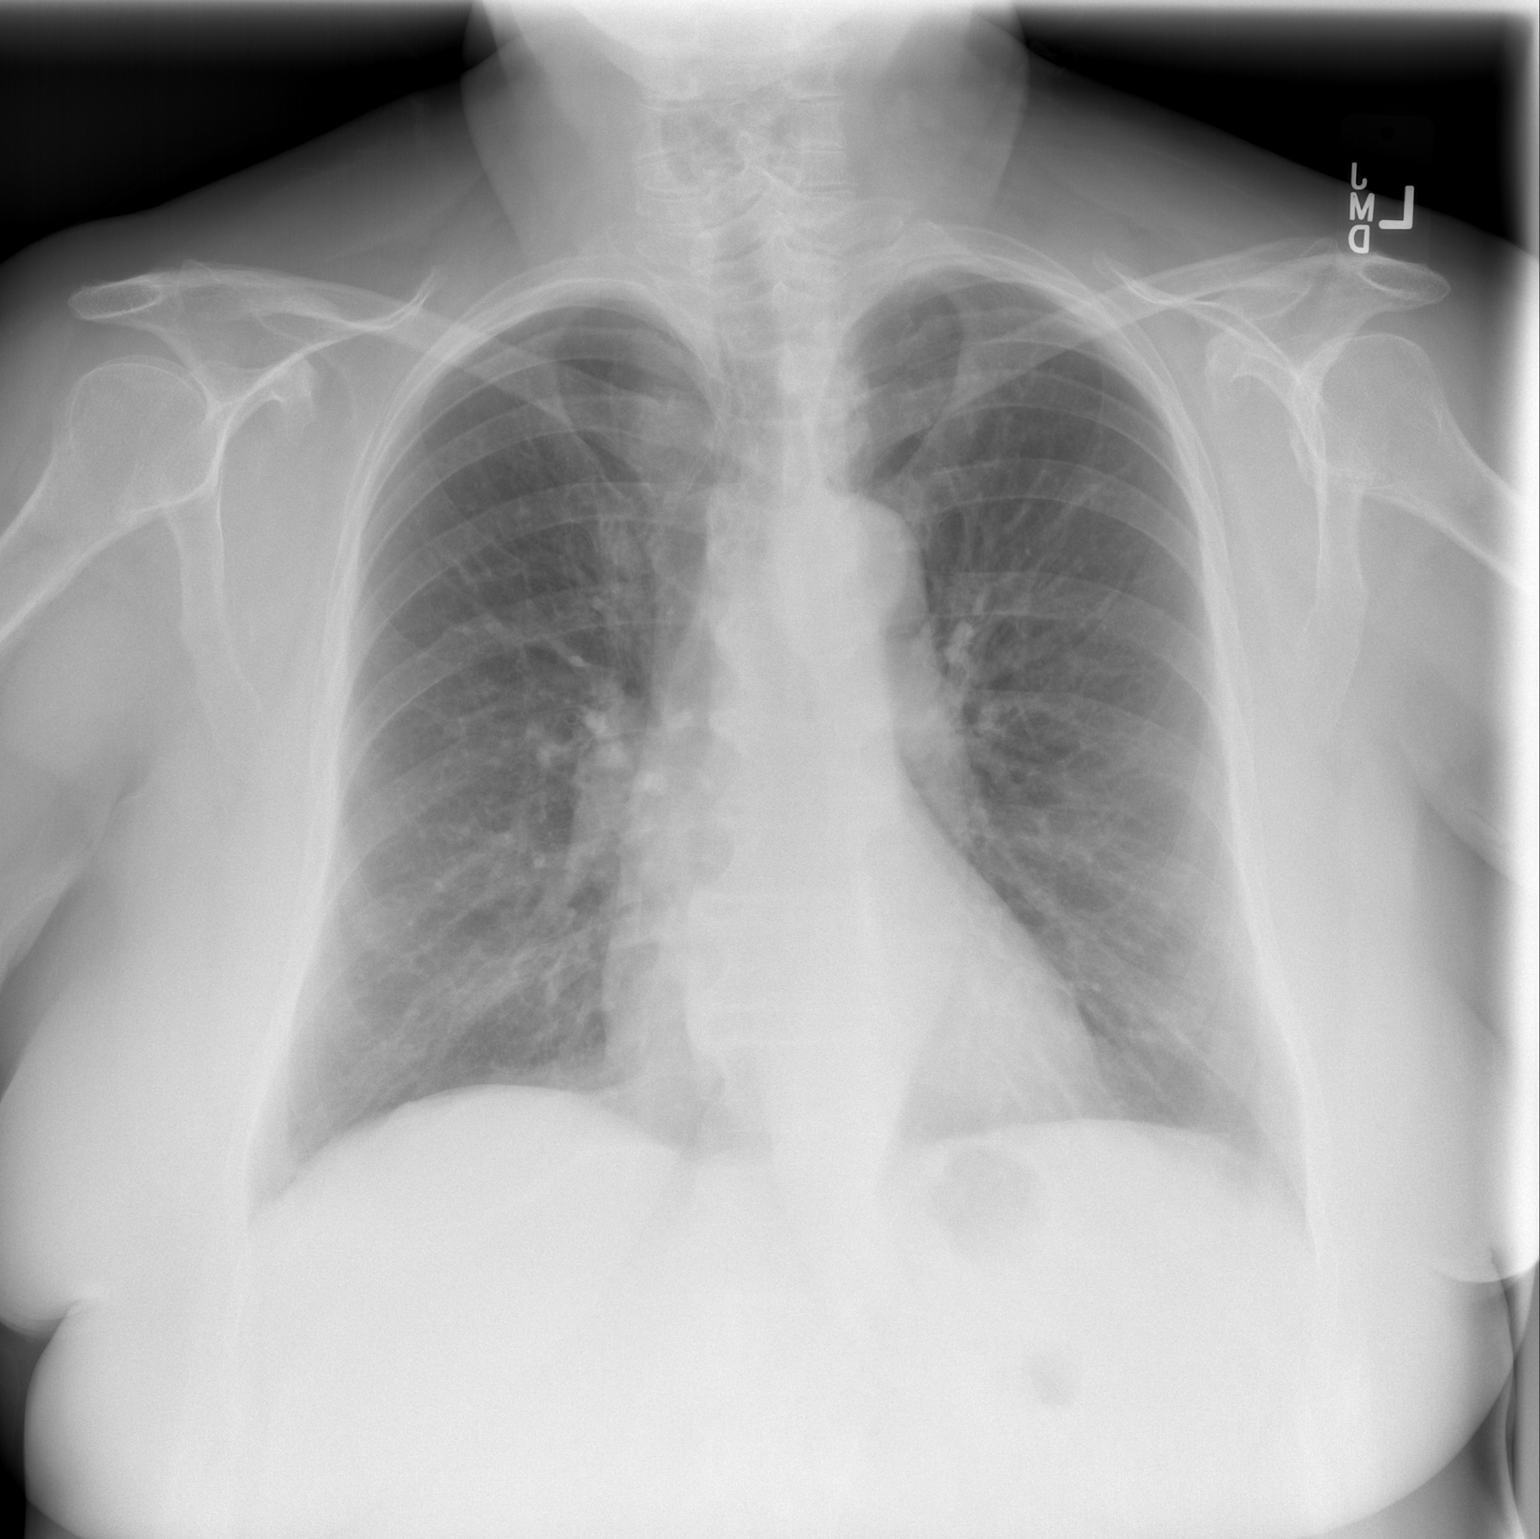

[w chest lat]
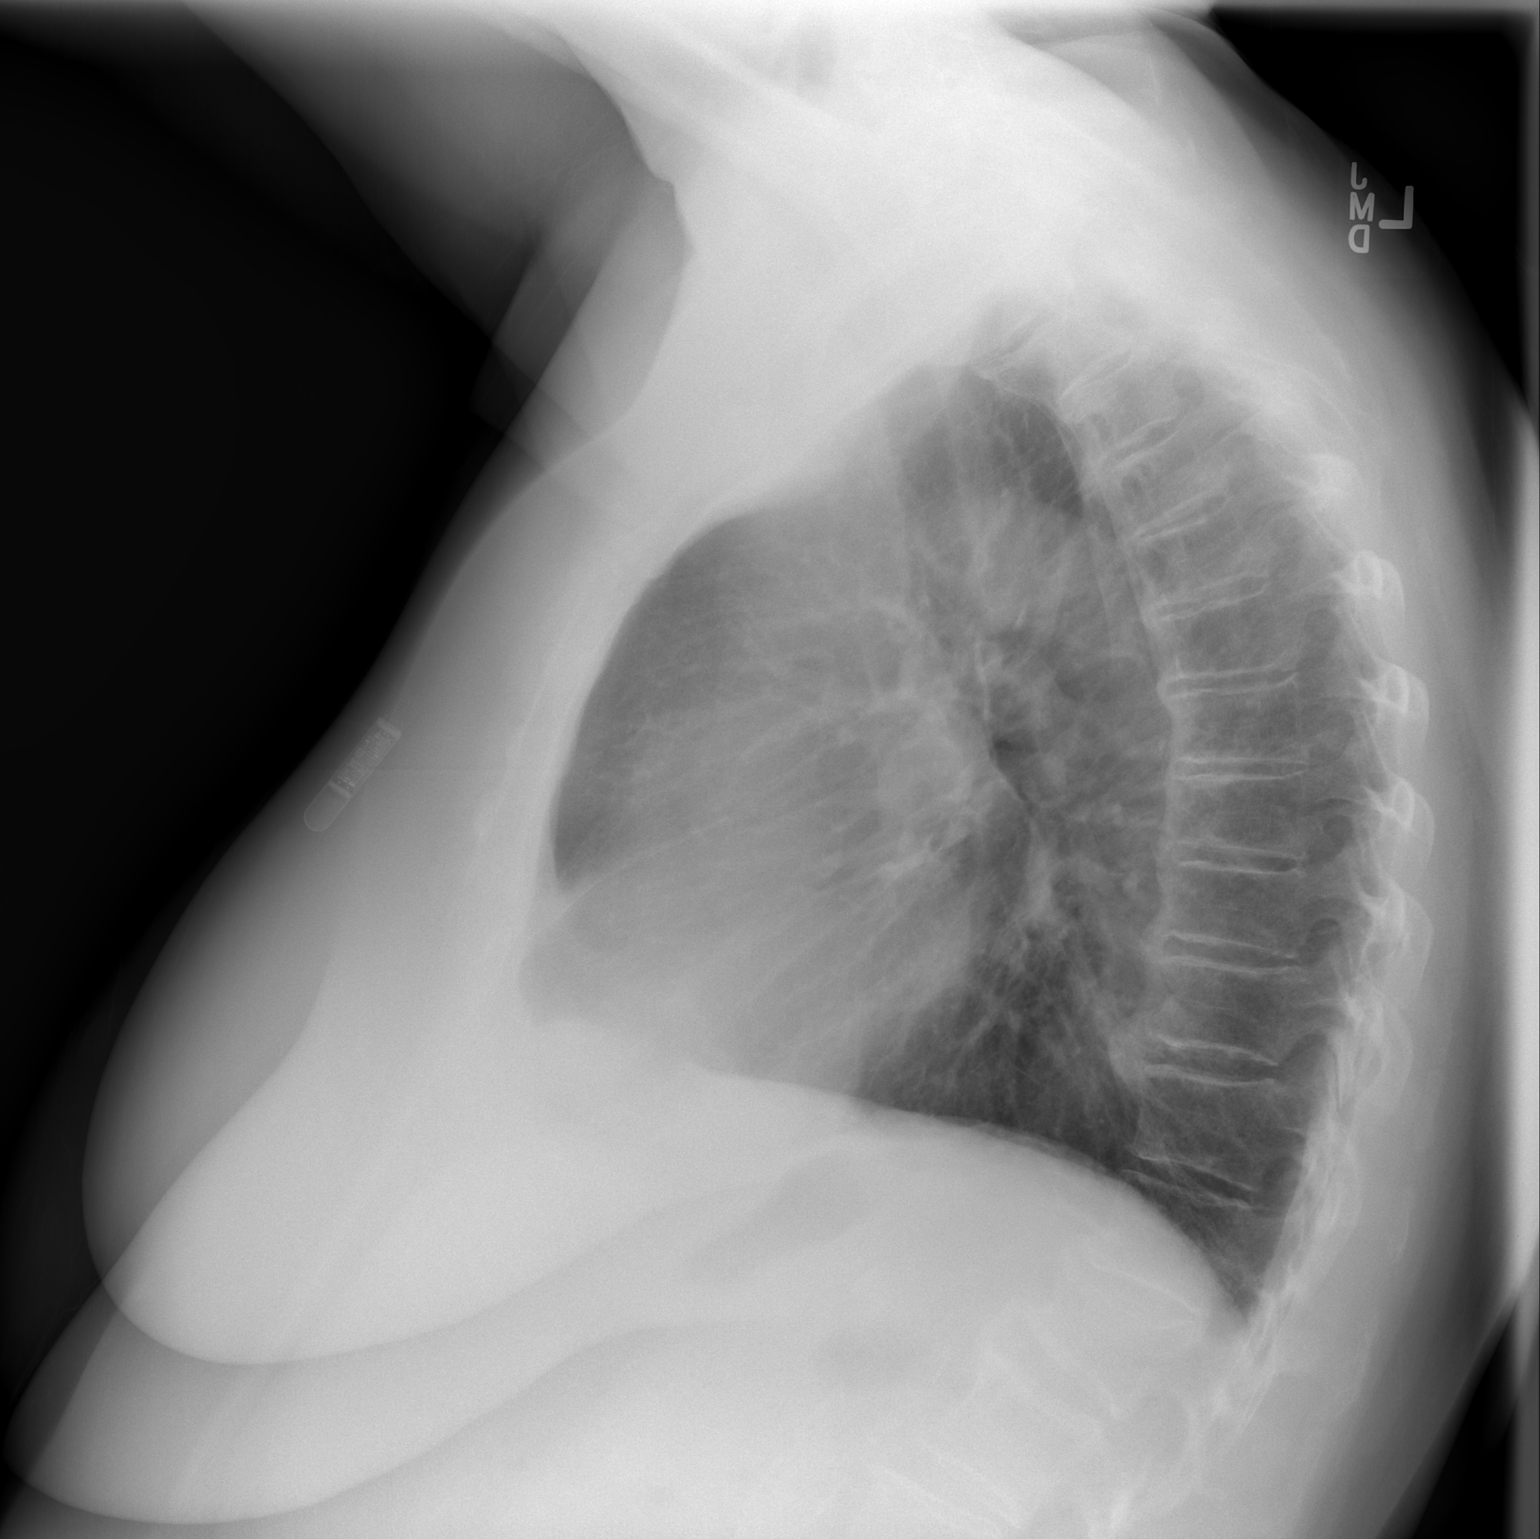

[2 of 2 positions shown; findings below may reference images not displayed]

FINDINGS: The heart size and mediastinal contours are within normal limits.
Both lungs are clear. No pleural effusion. No acute osseous
abnormality.
IMPRESSION: No acute process in the chest.

## 2020-02-23 ENCOUNTER — Ambulatory Visit (INDEPENDENT_AMBULATORY_CARE_PROVIDER_SITE_OTHER): Payer: Medicare Other | Admitting: Sports Medicine

## 2020-02-23 ENCOUNTER — Other Ambulatory Visit: Payer: Self-pay

## 2020-02-23 DIAGNOSIS — M79671 Pain in right foot: Secondary | ICD-10-CM

## 2020-02-23 NOTE — Progress Notes (Signed)
   [facility] [address] [place], [place] [address] Phone: [phone] Fax: [phone]   Patient Name: Jacqueline [name] Date of Birth: Apr 21, 1951 Medical Record Number: [id number] Gender: female Date of Encounter: 02/23/2020  SUBJECTIVE:      Chief Complaint:  Right foot pain   HPI:  Jacqueline [name] is a 69 year old female presenting with 6 weeks of right foot pain.  She was walking her dog and describes an inversion ankle sprain.  She thought it would get better over time, but she is still having pain on the outside of her foot with associated swelling.  Aggravating factors include full weightbearing.  Alleviated with Tylenol.  No radiating symptoms into her toes.  SHe only has 1 kidney, thus cannot take NSAIDs.     ROS:     See HPI.   PERTINENT  PMH / PSH / FH / SH:  Past Medical, Surgical, Social, and Family History Reviewed & Updated in the EMR. Pertinent findings include:  Nephrectomy, plantar fasciitis   OBJECTIVE:  BP 128/70   Ht 5\' 6"  (1.676 m)   Wt 204 lb (92.5 kg)   BMI 32.93 kg/m  Physical Exam:  Vital signs are reviewed.   GEN: Alert and oriented, NAD Pulm: Breathing unlabored PSY: normal mood, congruent affect  MSK: Right ankle No gross deformity, swelling, ecchymoses FROM No TTP Negative ant drawer and talar tilt.   Negative syndesmotic compression. Thompsons test negative. NV intact distally.  Right foot Small nonerythematous effusion at lateral dorsum of foot Full ROM at ankle and toes Full strength at ankle and toes Pain with resisted ankle dorsiflexion and toe extension TTP along lateral midfoot Negative compression test Normal transverse and medial arch NVI  Limited MSK ultrasound Right foot The lateral ankle ligaments were visualized in long and short axis without abnormality.  There was hypoechoic changes at the calcaneocuboid joint with mild bony irregularity. Area of calcaneal-cuboid ligament is possibly  retracted  Impression: Sprain of calcaneocuboid joint with persistent effusion  Ultrasound and interpretation by Dr.  Kathrynn Speed and Wolfgang Phoenix. Fields, MD     ASSESSMENT & PLAN:   1. Right foot pain  And ultrasound findings she likely had a cuboid subluxation.  We fitted her with an arch binder that she will wear for 6 weeks.  She is to use Voltaren gel topically 3-4 times a day along with her ice.  She is to start gentle balance and proprioception exercises.  She will follow-up with [place] in 6 weeks.  [name], DO, ATC Sports Medicine Fellow  I observed and examined the patient with Dr. Kathrynn Speed and agree with assessment and plan.  Note reviewed and modified by me. [name], MD

## 2020-03-25 ENCOUNTER — Other Ambulatory Visit: Payer: Self-pay

## 2020-03-25 ENCOUNTER — Encounter: Payer: Self-pay | Admitting: Internal Medicine

## 2020-03-25 ENCOUNTER — Ambulatory Visit (INDEPENDENT_AMBULATORY_CARE_PROVIDER_SITE_OTHER): Payer: Medicare Other | Admitting: Internal Medicine

## 2020-03-25 VITALS — BP 144/76 | HR 55 | Ht 66.5 in | Wt 203.8 lb

## 2020-03-25 DIAGNOSIS — E7800 Pure hypercholesterolemia, unspecified: Secondary | ICD-10-CM | POA: Diagnosis not present

## 2020-03-25 DIAGNOSIS — I10 Essential (primary) hypertension: Secondary | ICD-10-CM | POA: Diagnosis not present

## 2020-03-25 DIAGNOSIS — I480 Paroxysmal atrial fibrillation: Secondary | ICD-10-CM | POA: Diagnosis not present

## 2020-03-25 HISTORY — PX: OTHER SURGICAL HISTORY: SHX169

## 2020-03-25 NOTE — Progress Notes (Signed)
PCP: Deland Pretty, MD   Primary EP: Dr Barbarann Ehlers is a 69 y.o. female who presents today for routine electrophysiology followup.  Since last being seen in our clinic, the patient reports doing very well.  AF is well controlled.  Today, she denies symptoms of palpitations, chest pain, shortness of breath,  lower extremity edema, dizziness, presyncope, or syncope.  The patient is otherwise without complaint today.   Past Medical History:  Diagnosis Date  . Arthritis    Spine and Bil hands  . Compression fracture of L1 lumbar vertebra ([facility]) 01/30/2018   CXR  . Fatty liver   . Gait abnormality   . GERD (gastroesophageal reflux disease)   . History of hiatal hernia   . HTN (hypertension)   . Hyperlipidemia   . OSA on CPAP   . Paroxysmal atrial fibrillation (HCC)   . Plantar fasciitis, left   . Pneumonia    History of   . PONV (postoperative nausea and vomiting)   . Pre-diabetes   . Skin rash    under bil breast  . Stress incontinence, female   . Typical atrial flutter [facility])    Past Surgical History:  Procedure Laterality Date  . ANTERIOR AND POSTERIOR VAGINAL REPAIR    . BLADDER SURGERY    . BURCH PROCEDURE    . COLONOSCOPY    . ELECTROPHYSIOLOGIC STUDY N/A 12/22/2015   CTI and PVI ablation by Dr Rayann Heman  . ELECTROPHYSIOLOGIC STUDY N/A 10/02/2016   Procedure: Atrial Fibrillation Ablation;  Surgeon: [name], MD;  Location: [facility];  Service: Cardiovascular;  Laterality: N/A;  . EP IMPLANTABLE DEVICE N/A 08/11/2015   Procedure: Loop Recorder Insertion;  Surgeon: [name], MD;  Location: [facility];  Service: Cardiovascular;  Laterality: N/A;  . ROBOT ASSISTED LAPAROSCOPIC NEPHRECTOMY Left 03/07/2018   Procedure: XI ROBOTIC ASSISTED LAPAROSCOPIC NEPHRECTOMY;  Surgeon: Cleon Gustin, MD;  Location: [facility];  Service: Urology;  Laterality: Left;  Marland Kitchen VAGINAL HYSTERECTOMY      ROS- all systems are reviewed and negatives except as per HPI  above  Current Outpatient Medications  Medication Sig Dispense Refill  . acetaminophen (TYLENOL) 500 MG tablet Take 500 mg by mouth daily as needed for mild pain or moderate pain.     Marland Kitchen atorvastatin (LIPITOR) 20 MG tablet Take 10 mg by mouth daily.     . cetirizine (ZYRTEC) 10 MG tablet Take 10 mg by mouth daily.      Marland Kitchen estradiol (ESTRACE) 0.1 MG/GM vaginal cream Place 1 Applicatorful vaginally 3 (three) times a week.    . famotidine (PEPCID) 20 MG tablet Take 20 mg by mouth 2 (two) times daily.    . fluticasone (FLONASE) 50 MCG/ACT nasal spray Place 1 spray into both nostrils daily as needed for allergies or rhinitis.    Marland Kitchen losartan (COZAAR) 25 MG tablet Take 75 mg by mouth daily.    . nitrofurantoin, macrocrystal-monohydrate, (MACROBID) 100 MG capsule Take 100 mg by mouth at bedtime.  2  . pramipexole (MIRAPEX) 0.125 MG tablet Take 0.25 mg by mouth every evening. 3 hours before bedtime  2  . Probiotic Product (PROBIOTIC PO) Take 1 capsule by mouth daily.    . Rivaroxaban (XARELTO) 15 MG TABS tablet TAKE 1 TABLET BY MOUTH EVERY DAY WITH SUPPER 90 tablet 1   No current facility-administered medications for this visit.    Physical Exam: Vitals:   03/25/20 1409  BP: (!) 144/76  Pulse: (!) 55  SpO2: 97%  Weight: 203 lb 12.8 oz (92.4 kg)  Height: 5' 6.5" (1.689 m)    GEN- The patient is well appearing, alert and oriented x 3 today.   Head- normocephalic, atraumatic Eyes-  Sclera clear, conjunctiva pink Ears- hearing intact Oropharynx- clear Lungs- Clear to ausculation bilaterally, normal work of breathing Heart- Regular rate and rhythm, no murmurs, rubs or gallops, PMI not laterally displaced GI- soft, NT, ND, + BS Extremities- no clubbing, cyanosis, or edema  Wt Readings from Last 3 Encounters:  03/25/20 203 lb 12.8 oz (92.4 kg)  02/23/20 204 lb (92.5 kg)  02/19/19 200 lb (90.7 kg)    EKG tracing ordered today is personally reviewed and shows sinus bradycardia  Assessment  and Plan:  1. Paroxysmal atrial fibrillation chad2vasc score is 4.  She is on xarelto Her xarelto dosing is appropriate for CrCl < 50. Her LINQ battery has depleted.  She would like to have her ILR removed.  Risks and benefits to the procedure were discussed with the patient who wishes to proceed.  2. HTN Stable No change required today  3. HL Continue lipitor  Return in a year to see EP PA  [name] MD, [facility] 03/25/2020 2:21 PM      PROCEDURES:   1. Implantable loop recorder explantation        DESCRIPTION OF PROCEDURE:  Informed written consent was obtained.  The patient required no sedation for the procedure today.   The patients left chest was therefore prepped and draped in the usual sterile fashion.  The skin overlying the ILR monitor was infiltrated with lidocaine for local analgesia.  A 0.5-cm incision was made over the site.  The previously implanted ILR was exposed and removed using a combination of sharp and blunt dissection.  Steri- Strips and a sterile dressing were then applied. EBL<31ml.  There were no early apparent complications.     CONCLUSIONS:   1. Successful explantation of a [employer] Reveal LINQ implantable loop recorder   2. No early apparent complications.        [name] MD, [facility] 03/25/2020 2:21 PM

## 2020-03-25 NOTE — Patient Instructions (Addendum)
Medication Instructions:  Your physician recommends that you continue on your current medications as directed. Please refer to the Current Medication list given to you today.  Labwork: None ordered.  Testing/Procedures: None ordered.  Follow-Up:  Your physician wants you to follow-up in: one year with Tommye Standard, PA.   You will receive a reminder letter in the mail two months in advance. If you don't receive a letter, please call our office to schedule the follow-up appointment.    Implantable Loop Recorder Placement, Care After Refer to this sheet in the next few weeks. These instructions provide you with information about caring for yourself after your procedure. Your health care provider may also give you more specific instructions. Your treatment has been planned according to current medical practices, but problems sometimes occur. Call your health care provider if you have any problems or questions after your procedure. What can I expect after the procedure? After the procedure, it is common to have:  Soreness or pain near the cut from surgery (incision).  Some swelling or bruising near the incision.  Follow these instructions at home:  Medicines  Take over-the-counter and prescription medicines only as told by your health care provider.  If you were prescribed an antibiotic medicine, take it as told by your health care provider. Do not stop taking the antibiotic even if you start to feel better.  Bathing Do not take baths, swim, or use a hot tub until your health care provider approves. You may shower 24 hours after removal of your monitor.  Incision care  Follow instructions from your health care provider about how to take care of your incision. Make sure you: ? Remove your top dressing after 24 hours (before you shower) ? Leave stitches (sutures), skin glue, or adhesive strips in place. These skin closures may need to stay in place for 2 weeks or longer. If adhesive  strip edges start to loosen and curl up, you may trim the loose edges. Do not remove adhesive strips completely unless your health care provider tells you to do that.  Check your incision area every day for signs of infection. Check for: ? More redness, swelling, or pain. ? Fluid or blood. ? Warmth. ? Pus or a bad smell.  Contact a health care provider if:  You have more redness, swelling, or pain around your incision.  You have more fluid or blood coming from your incision.  Your incision feels warm to the touch.  You have pus or a bad smell coming from your incision.  You have a fever.  You have pain that is not relieved by your pain medicine.  You have triggered your device because of fainting (syncope) or because of a heartbeat that feels like it is racing, slow, fluttering, or skipping (palpitations).

## 2020-03-27 ENCOUNTER — Other Ambulatory Visit: Payer: Self-pay | Admitting: Internal Medicine

## 2020-03-28 NOTE — Telephone Encounter (Signed)
Pt last saw Dr Rayann Heman 03/25/20, last labs 11/02/19 Creat 1.7 at [facility] age 69, weight 92.4kg, CrCl 44.92, based on CrCl pt is on appropriate dosage of Xarelto 15mg  QD.  Will refill rx.

## 2020-03-29 NOTE — Addendum Note (Signed)
Addended by: Rose Phi on: 03/29/2020 01:13 PM   Modules accepted: Orders

## 2020-04-05 ENCOUNTER — Encounter: Payer: Self-pay | Admitting: Sports Medicine

## 2020-04-05 ENCOUNTER — Ambulatory Visit (INDEPENDENT_AMBULATORY_CARE_PROVIDER_SITE_OTHER): Payer: Medicare Other | Admitting: Sports Medicine

## 2020-04-05 ENCOUNTER — Other Ambulatory Visit: Payer: Self-pay

## 2020-04-05 DIAGNOSIS — M79671 Pain in right foot: Secondary | ICD-10-CM

## 2020-04-05 NOTE — Progress Notes (Signed)
   [facility] [address] [place], [place] [address] Phone: [phone] Fax: [phone]   Patient Name: Jacqueline [name] Date of Birth: [birth year] Medical Record Number: [id number] Gender: female Date of Encounter: 04/05/2020  SUBJECTIVE:      Chief Complaint:  Right foot follow-up   HPI:  Halleigh is following up for right cuboid subluxation.  We last saw her 6 weeks ago and fitted her for an arch binder.  She has the most pain when walking on uneven surfaces.  She does not have any pain when she is barefoot inside the house.  She denies any new injury or swelling.  She is wearing sketchers slip in shoes that have a cushioned orthotic.     ROS:     See HPI.   PERTINENT  PMH / PSH / FH / SH:  Past Medical, Surgical, Social, and Family History Reviewed & Updated in the EMR.    OBJECTIVE:  BP 130/64   Ht 5' 6.5" (1.689 m)   Wt 204 lb (92.5 kg)   BMI 32.43 kg/m  Physical Exam:  Vital signs are reviewed.   GEN: Alert and oriented, NAD Pulm: Breathing unlabored PSY: normal mood, congruent affect  MSK: Right foot Small nonerythematous effusion at lateral dorsum of foot Full ROM at ankle and toes Full strength at ankle and toes TTP along lateral midfoot at the level of the cuboid Positive midfoot adduction test Negative compression test Normal transverse and medial arch NVI  ASSESSMENT & PLAN:   1. Right cuboid subluxation  Patient has noticed much improvement in her symptoms.  She is wearing the arch binder when ambulating.  Have also fitted her with long lateral heel wedges bilaterally that she can use.  We will leave it open-ended to her as to follow-up if she is not improving.   [name], DO, ATC Sports Medicine Fellow  I observed and examined the patient with Dr. Adria Dill and agree with assessment and plan.  Note reviewed and modified by me. [name], MD

## 2020-08-02 ENCOUNTER — Other Ambulatory Visit: Payer: Self-pay | Admitting: Internal Medicine

## 2020-08-02 NOTE — Telephone Encounter (Signed)
Pt last saw Dr Rayann Heman 03/25/20, last labs 07/18/20 Creat 1.7, age 69, weight 92.5kg, CrCl 44.32, based on CrCl pt is on appropriate dosage of Xarelto 15mg  QD.  Will refill rx.

## 2020-09-05 ENCOUNTER — Ambulatory Visit (HOSPITAL_COMMUNITY)
Admission: RE | Admit: 2020-09-05 | Discharge: 2020-09-05 | Disposition: A | Discharge status: Home / Self Care | Payer: Medicare Other | Source: Ambulatory Visit | Attending: Urology | Admitting: Urology

## 2020-09-05 ENCOUNTER — Other Ambulatory Visit (HOSPITAL_COMMUNITY): Payer: Self-pay | Admitting: Urology

## 2020-09-05 ENCOUNTER — Other Ambulatory Visit: Payer: Self-pay

## 2020-09-05 DIAGNOSIS — C642 Malignant neoplasm of left kidney, except renal pelvis: Secondary | ICD-10-CM | POA: Diagnosis present

## 2020-09-05 IMAGING — DX DG CHEST 2V
2 series · 2 of 2 positions shown · non-contrast
Comparison: [DATE]

CLINICAL DATA: History of hypertension and renal cell carcinoma
with subsequent left nephrectomy.

EXAM:
CHEST - 2 VIEW

[chest pa]
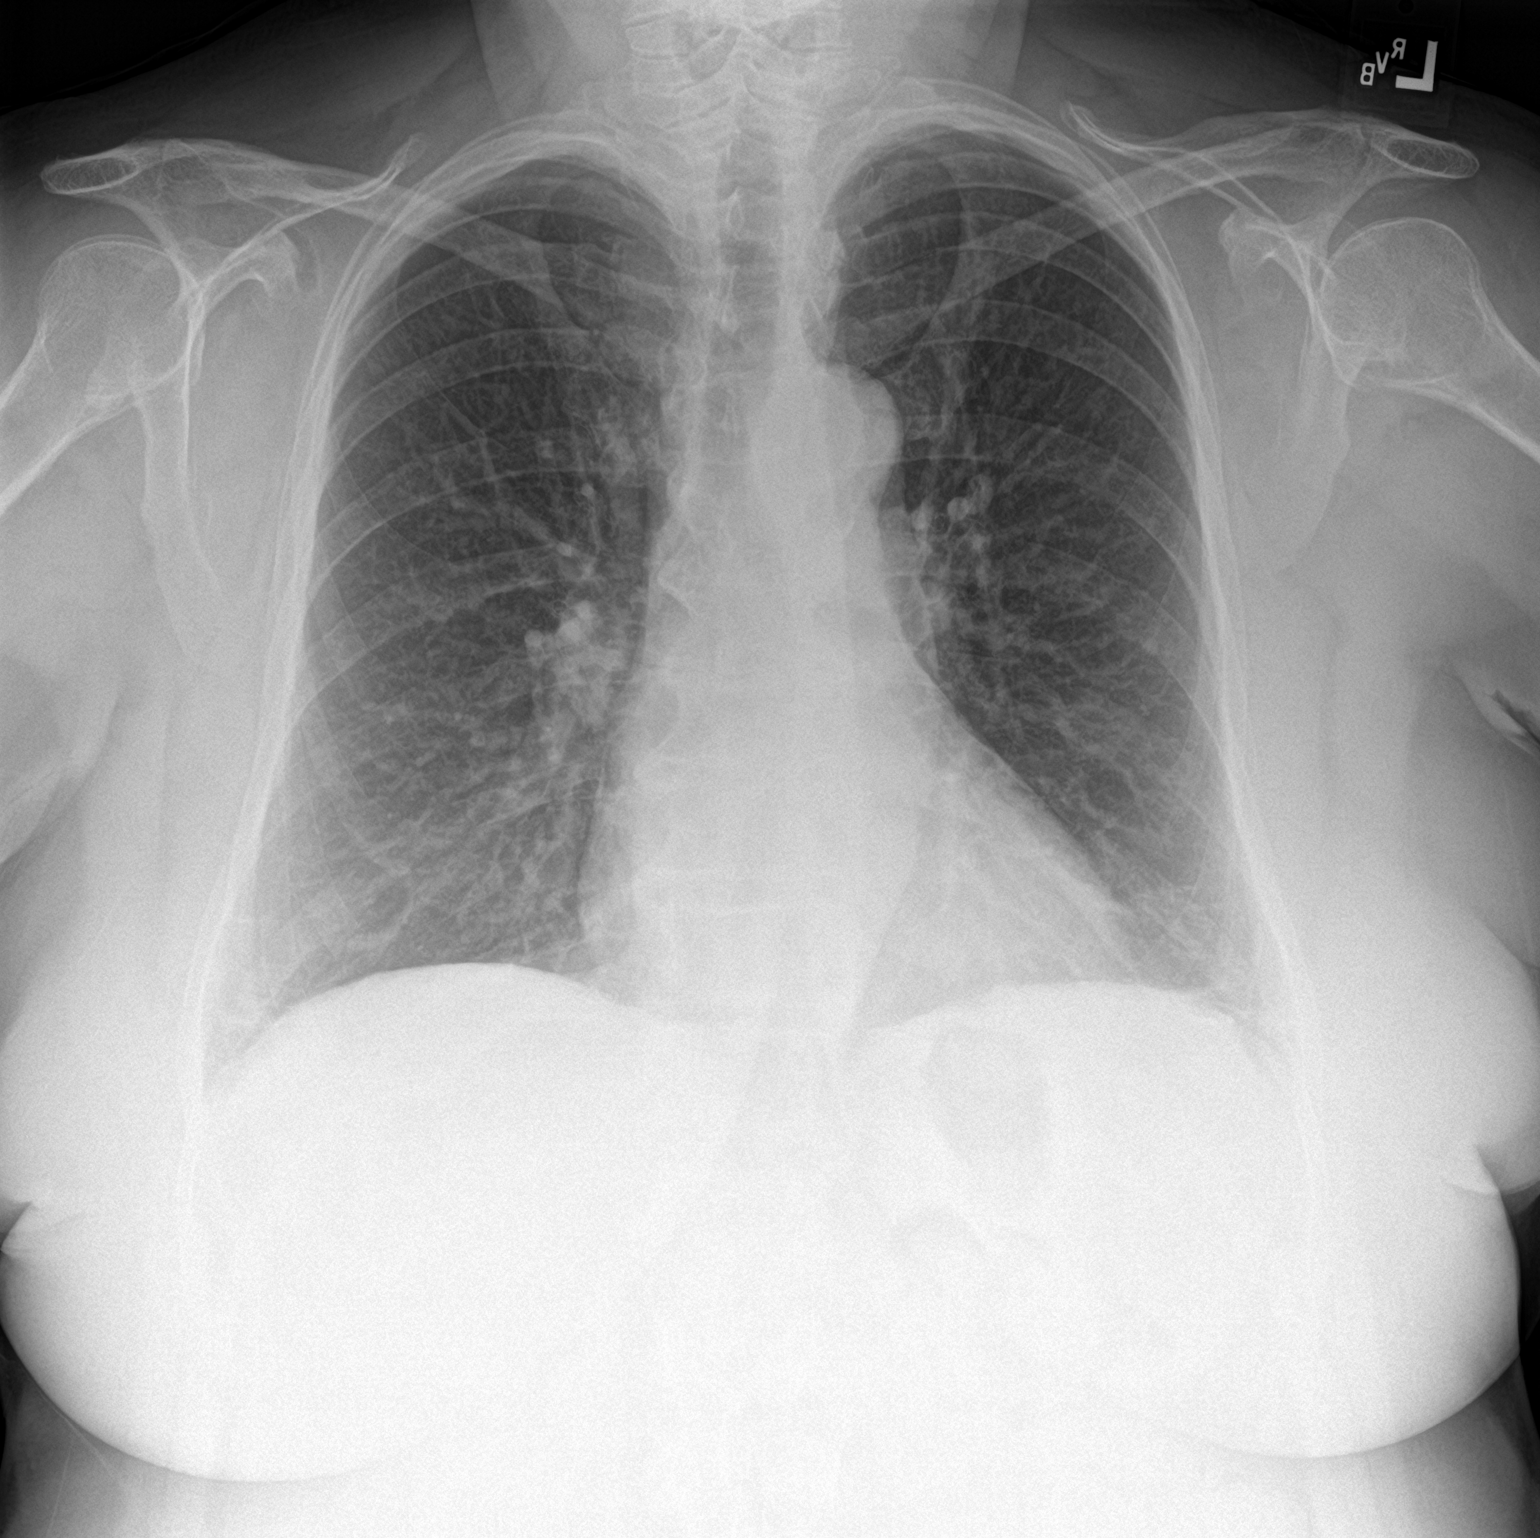

[chest lat]
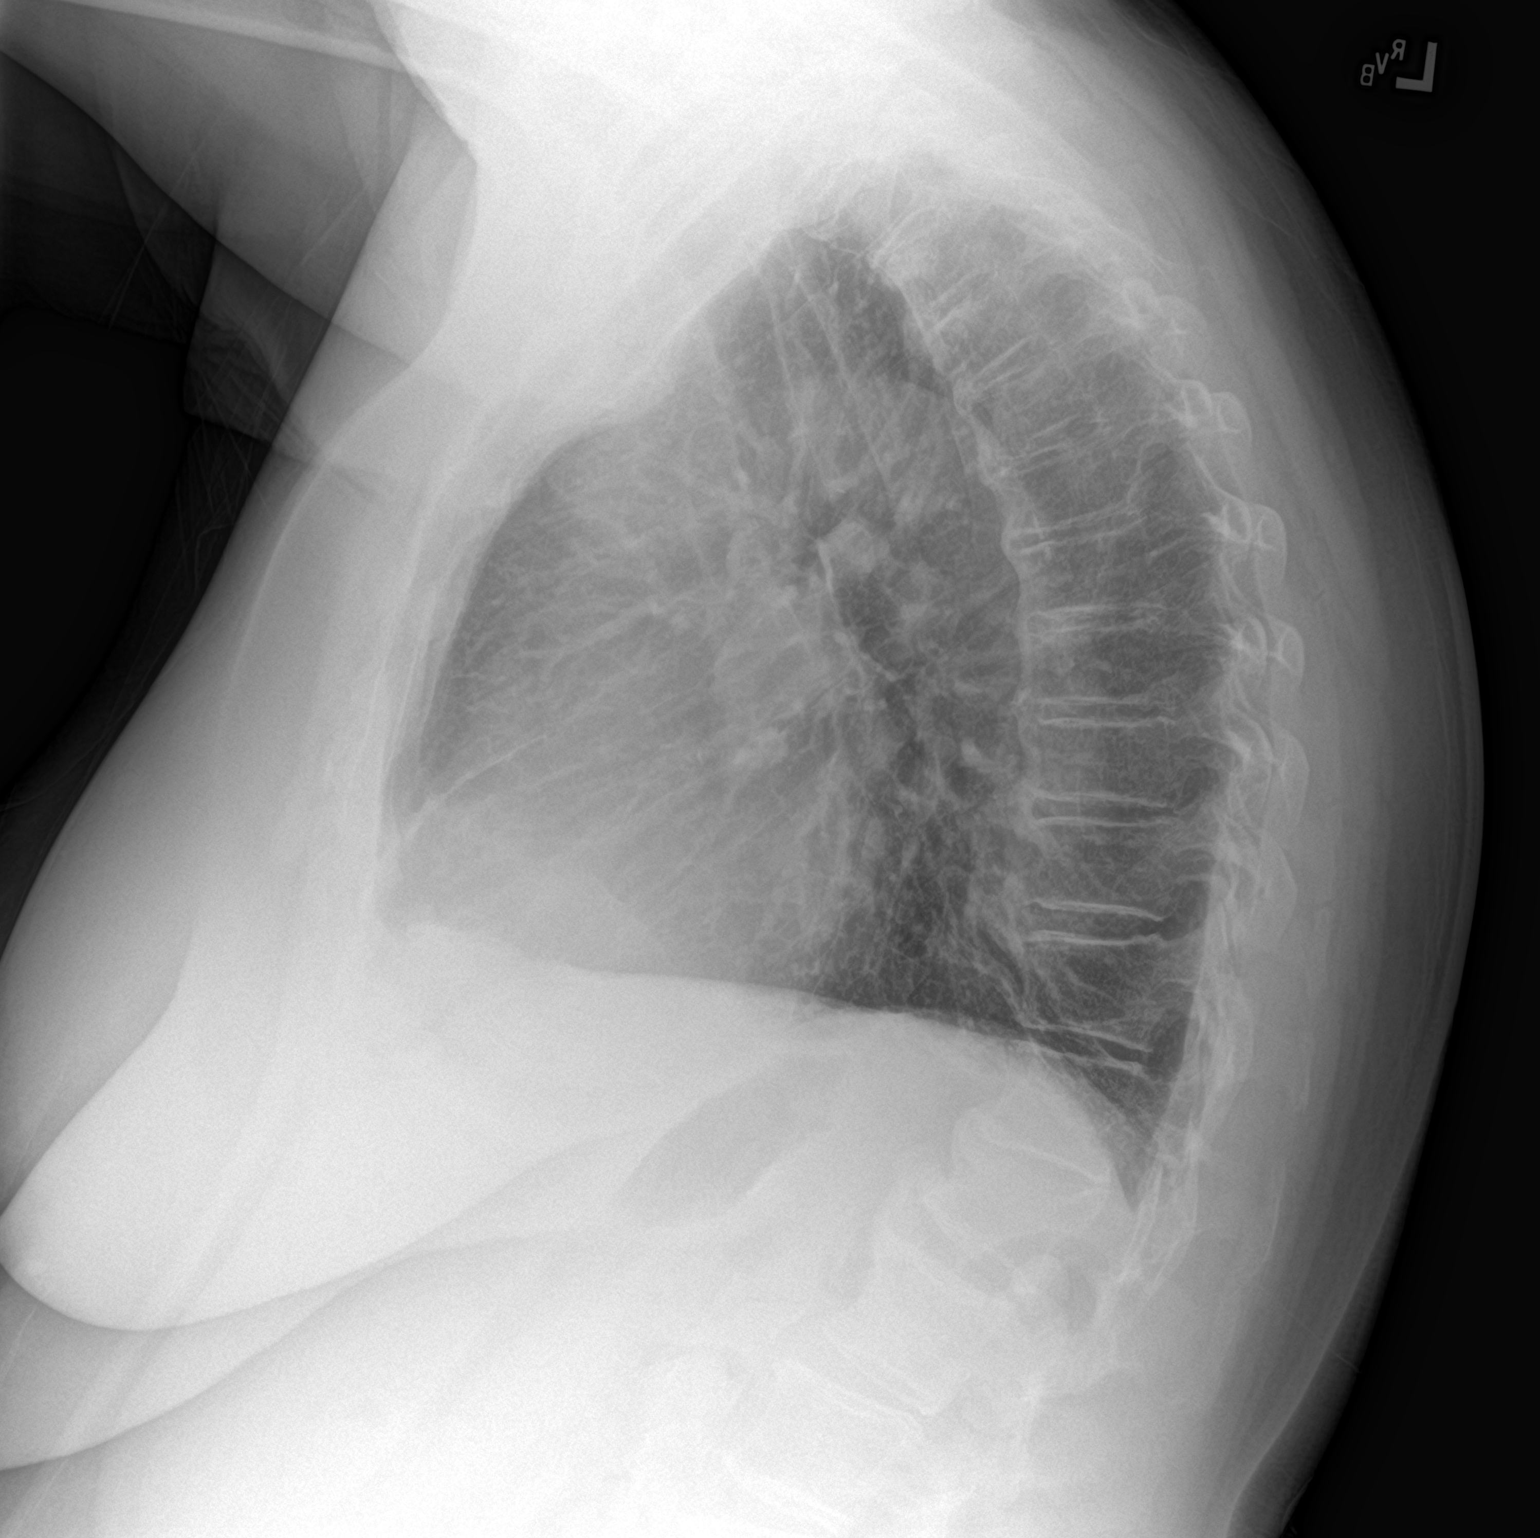

[2 of 2 positions shown; findings below may reference images not displayed]

FINDINGS: A trace amount of atelectasis is seen within the lateral aspects of
the bilateral lung bases. A 4 mm well-defined nodular opacity is
seen overlying the lateral aspect of the mid left lung. This
represents a new finding when compared to the prior exam. There is
no evidence of acute infiltrate, pleural effusion or pneumothorax.
The heart size and mediastinal contours are within normal limits.
There is tortuosity of the descending thoracic aorta. The visualized
skeletal structures are unremarkable.
IMPRESSION: Subcentimeter nodular opacity overlying the mid left lung.
Correlation with chest CT is recommended to exclude interval
development of a small lung nodule since the prior study.

## 2020-09-21 ENCOUNTER — Other Ambulatory Visit (HOSPITAL_COMMUNITY): Payer: Self-pay | Admitting: Urology

## 2020-09-21 ENCOUNTER — Other Ambulatory Visit: Payer: Self-pay | Admitting: Urology

## 2020-09-21 DIAGNOSIS — D4101 Neoplasm of uncertain behavior of right kidney: Secondary | ICD-10-CM

## 2020-10-13 ENCOUNTER — Ambulatory Visit
Admission: RE | Admit: 2020-10-13 | Discharge: 2020-10-13 | Disposition: A | Discharge status: Home / Self Care | Payer: Medicare Other | Source: Ambulatory Visit | Attending: Urology | Admitting: Urology

## 2020-10-13 DIAGNOSIS — D4101 Neoplasm of uncertain behavior of right kidney: Secondary | ICD-10-CM

## 2020-10-13 IMAGING — MR MR ABDOMEN WO/W CM
17 series · 48 of 48 positions shown · IV contrast (15ml Multihance)
Comparison: CT evaluation from [DATE].

CLINICAL DATA: Follow-up RIGHT renal abnormality, history of LEFT
nephrectomy for clear cell renal cell carcinoma.

EXAM:
MRI ABDOMEN WITHOUT AND WITH CONTRAST
TECHNIQUE: Multiplanar multisequence MR imaging of the abdomen was performed
both before and after the administration of intravenous contrast.
CONTRAST:  15mL MULTIHANCE GADOBENATE DIMEGLUMINE 529 MG/ML IV SOLN

[Series 3: T2 · coronal · 5.0mm · 1.56mm/px · 1 of 35 slices shown (1 of 3)]
[im 1/35]
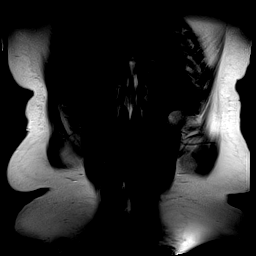

[Series 5: T2 · axial · 5.0mm · 1.56mm/px · 1 of 42 slices shown (2 of 3)]
[im 1/42]
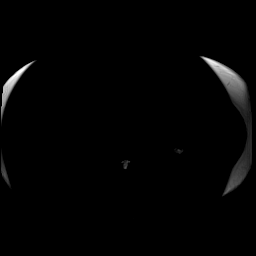

[Series 6: DWI · axial · 5.0mm · 1.49mm/px · z∈[-89,+163]mm · 5 of 129 slices shown (1 of 2)]
[im 1/129]
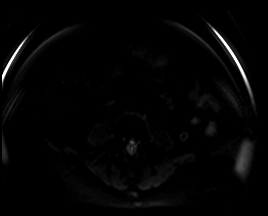
[im 33/129]
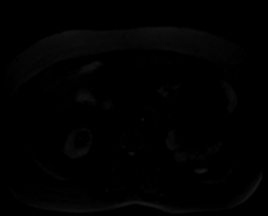
[im 65/129]
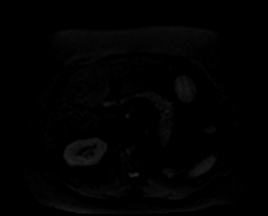
[im 97/129]
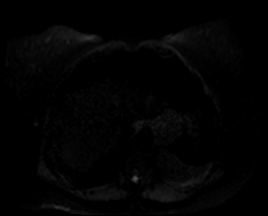
[im 129/129]
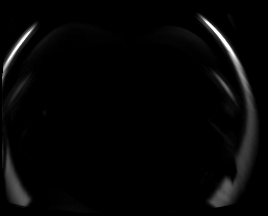

[Series 7: DWI · axial · 5.0mm · 1.49mm/px · z∈[-89,+163]mm · 2 of 43 slices shown (2 of 2)]
[im 1/43]
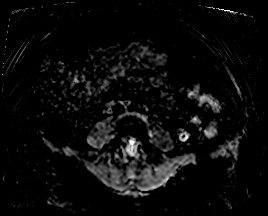
[im 43/43]
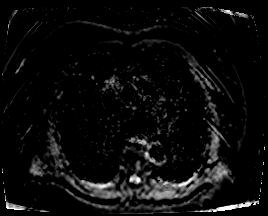

[Series 8: T1 · axial · 3.0mm · 1.25mm/px · z∈[-108,+129]mm · 6 of 160 slices shown]
[im 1/160]
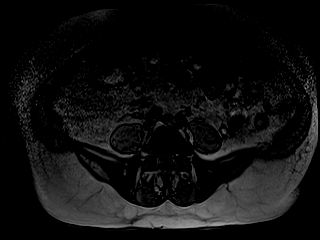
[im 32/160]
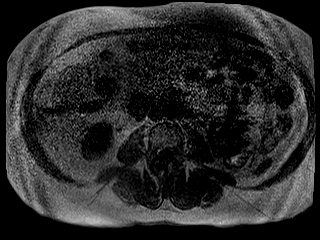
[im 64/160]
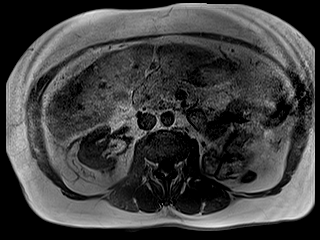
[im 96/160]
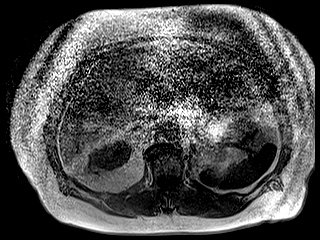
[im 128/160]
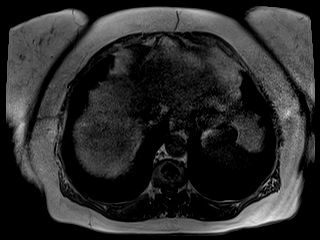
[im 160/160]
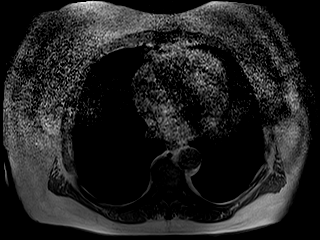

[Series 9: T2 · axial · 6.0mm · 1.25mm/px · 1 of 33 slices shown (3 of 3)]
[im 1/33]
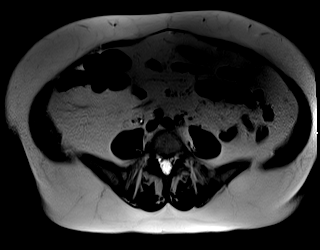

[Series 10: bSSFP · axial · 5.0mm · 1.25mm/px · z∈[-107,+127]mm · 2 of 40 slices shown]
[im 1/40]
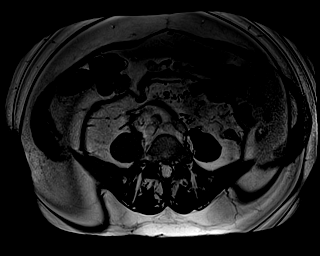
[im 40/40]
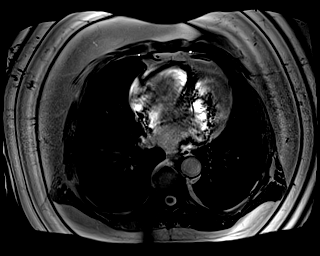

[Series 11: T1 dynamic · axial · non-contrast · 3.0mm · 1.25mm/px · z∈[-108,+129]mm · 3 of 80 slices shown]
[im 1/80]
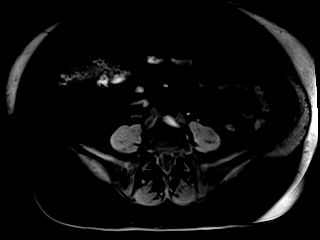
[im 40/80]
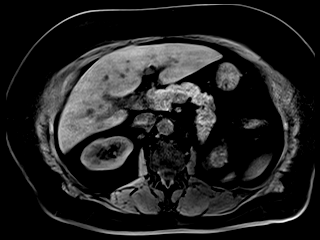
[im 80/80]
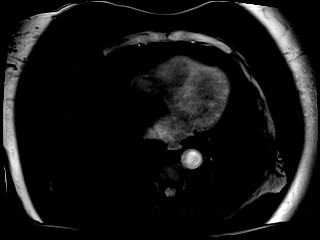

[Series 12: T1 dynamic post-contrast · axial · 3.0mm · 1.25mm/px · z∈[-108,+129]mm · 3 of 80 slices shown (1 of 9)]
[im 1/80]
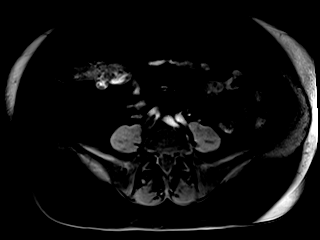
[im 40/80]
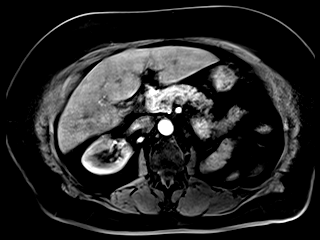
[im 80/80]
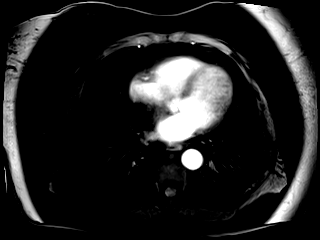

[Series 13: T1 dynamic post-contrast · axial · 3.0mm · 1.25mm/px · z∈[-108,+129]mm · 3 of 80 slices shown (2 of 9)]
[im 1/80]
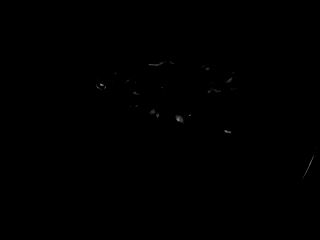
[im 40/80]
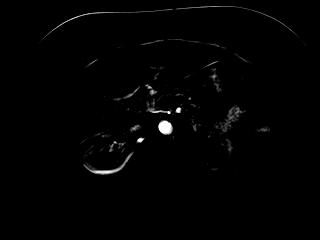
[im 80/80]
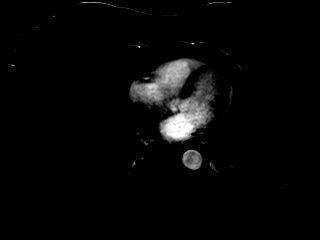

[Series 14: T1 dynamic post-contrast · axial · 3.0mm · 1.25mm/px · z∈[-108,+129]mm · 3 of 80 slices shown (3 of 9)]
[im 1/80]
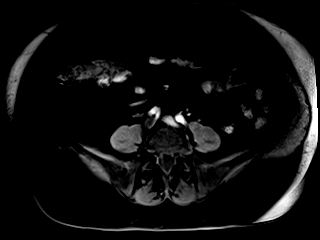
[im 40/80]
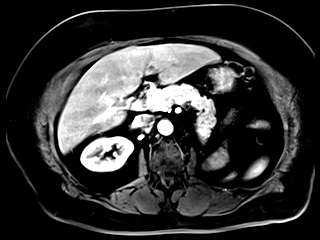
[im 80/80]
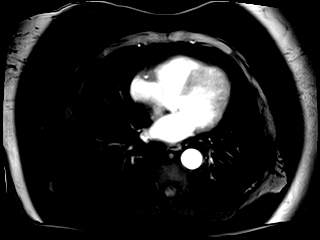

[Series 15: T1 dynamic post-contrast · axial · 3.0mm · 1.25mm/px · z∈[-108,+129]mm · 3 of 80 slices shown (4 of 9)]
[im 1/80]
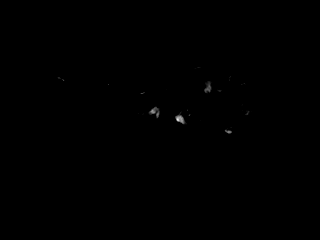
[im 40/80]
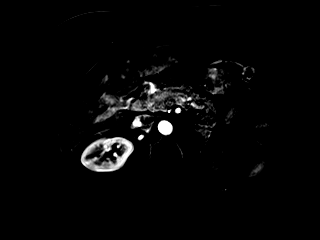
[im 80/80]
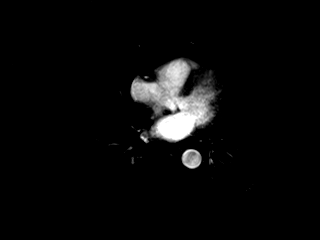

[Series 16: T1 dynamic post-contrast · axial · 3.0mm · 1.25mm/px · z∈[-108,+129]mm · 3 of 80 slices shown (5 of 9)]
[im 1/80]
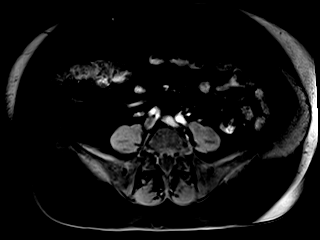
[im 40/80]
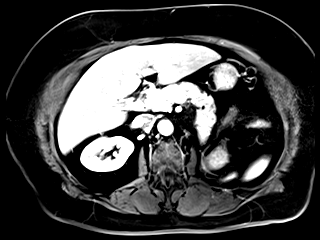
[im 80/80]
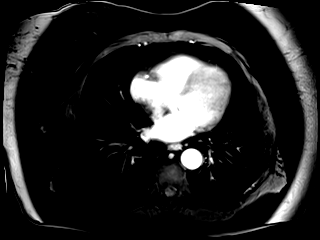

[Series 17: T1 dynamic post-contrast · axial · 3.0mm · 1.25mm/px · z∈[-108,+129]mm · 3 of 80 slices shown (6 of 9)]
[im 1/80]
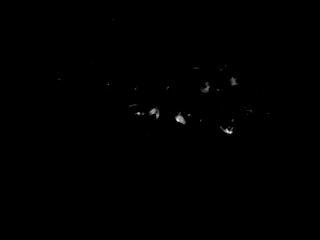
[im 40/80]
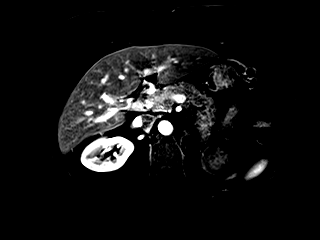
[im 80/80]
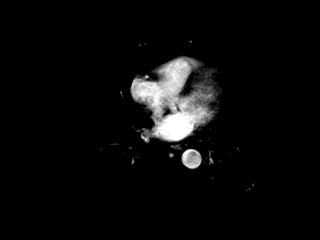

[Series 18: T1 dynamic post-contrast · coronal · 3.0mm · 1.25mm/px · 3 of 72 slices shown (7 of 9)]
[im 1/72]
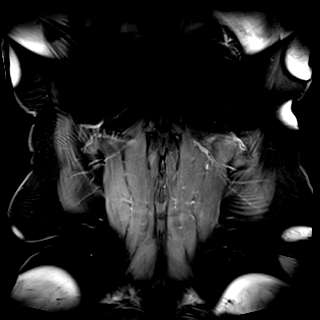
[im 36/72]
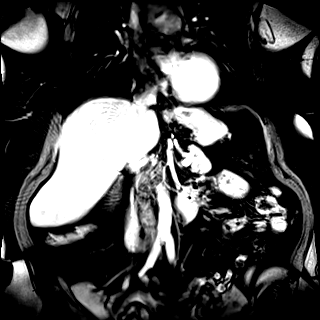
[im 72/72]
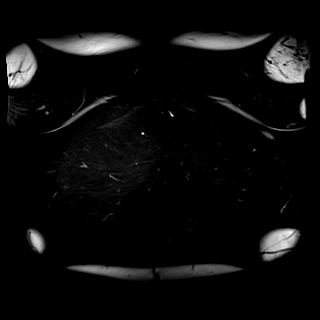

[Series 19: T1 dynamic post-contrast · axial · 3.0mm · 1.25mm/px · z∈[-108,+129]mm · 3 of 80 slices shown (8 of 9)]
[im 1/80]
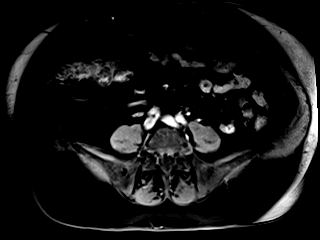
[im 40/80]
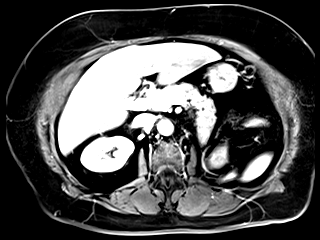
[im 80/80]
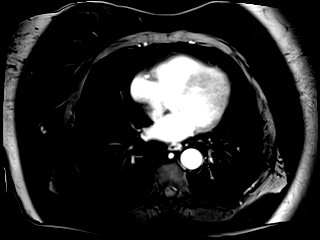

[Series 20: T1 dynamic post-contrast · axial · 3.0mm · 1.25mm/px · z∈[-108,+129]mm · 3 of 80 slices shown (9 of 9)]
[im 1/80]
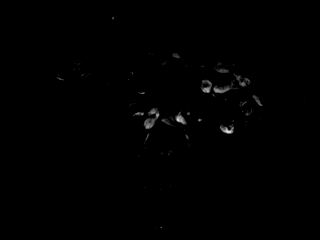
[im 40/80]
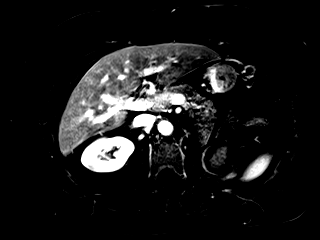
[im 80/80]
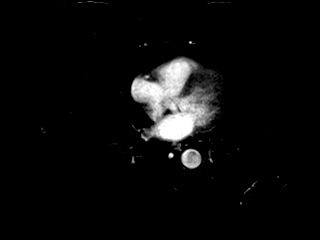

[48 of 48 positions shown; findings below may reference images not displayed]

FINDINGS: Lower chest: Limited assessment on MRI.  No effusion.

Hepatobiliary: Hepatic cysts and mild to moderate hepatic steatosis
with sludge in the gallbladder. No gross biliary duct distension. No
pericholecystic stranding.

Pancreas: Tiny cystic area in the tail of the pancreas (image 17,
series 9) no ductal dilation or sign of inflammation. Areas of fat
within the pancreas are unchanged dating back to [D4].

Spleen:  Normal

Adrenals/Urinary Tract: Postoperative changes in the LEFT renal
fossa. Altered configuration of LEFT adrenal gland with similar
appearance to prior imaging.

RIGHT adrenal gland is normal.

RIGHT renal cysts, area of concern represents a simple cyst, Bosniak
category 1 perhaps minimally proteinaceous, explaining slight
increased density on previous imaging but with homogeneous T2
hyperintensity and no signs of enhancement or nodularity.

Stomach/Bowel: No acute gastrointestinal process to the extent
evaluated on MRI, study not performed for bowel evaluation. Pelvic
bowel loops are not imaged.

Vascular/Lymphatic: Abdominal vasculature is patent. Separate origin
of hepatic and splenic arteries. There is no gastrohepatic or
hepatoduodenal ligament lymphadenopathy. No retroperitoneal or
mesenteric lymphadenopathy.

Other: No ascites. No nodularity in the retroperitoneum on the LEFT.

Musculoskeletal: No suspicious bone lesions identified.
IMPRESSION: 1. RIGHT renal cysts, area of concern represents a simple cyst,
perhaps minimally proteinaceous, explaining slight increased density
on previous imaging but with homogeneous T2 hyperintensity and no
signs of enhancement or nodularity. Bosniak category I.
2. Postoperative changes of LEFT nephrectomy without signs of
disease recurrence.
3. Tiny cystic area in the pancreas measuring approximately 5 mm.
Consider dedicated 1 year follow-up with MRI/MRCP or attention on
routine follow-up in this patient post LEFT nephrectomy.
4. Hepatic cysts and mild to moderate hepatic steatosis.

## 2020-10-13 MED ORDER — GADOBENATE DIMEGLUMINE 529 MG/ML IV SOLN
15.0000 mL | Freq: Once | INTRAVENOUS | Status: AC | PRN
  Administered 2020-10-13: 15 mL via INTRAVENOUS

## 2021-03-13 DIAGNOSIS — N183 Chronic kidney disease, stage 3 unspecified: Secondary | ICD-10-CM | POA: Diagnosis not present

## 2021-03-27 ENCOUNTER — Ambulatory Visit (HOSPITAL_COMMUNITY)
Admission: RE | Admit: 2021-03-27 | Discharge: 2021-03-27 | Disposition: A | Discharge status: Home / Self Care | Payer: Medicare Other | Source: Ambulatory Visit | Attending: Urology | Admitting: Urology

## 2021-03-27 ENCOUNTER — Other Ambulatory Visit: Payer: Self-pay

## 2021-03-27 ENCOUNTER — Other Ambulatory Visit (HOSPITAL_COMMUNITY): Payer: Self-pay | Admitting: Urology

## 2021-03-27 DIAGNOSIS — Z85528 Personal history of other malignant neoplasm of kidney: Secondary | ICD-10-CM

## 2021-03-27 IMAGING — DX DG CHEST 2V
2 series · 2 of 2 positions shown · non-contrast
Comparison: [DATE]

CLINICAL DATA: 69-year-old female with a history of renal cancer

EXAM:
CHEST - 2 VIEW

[chest pa]
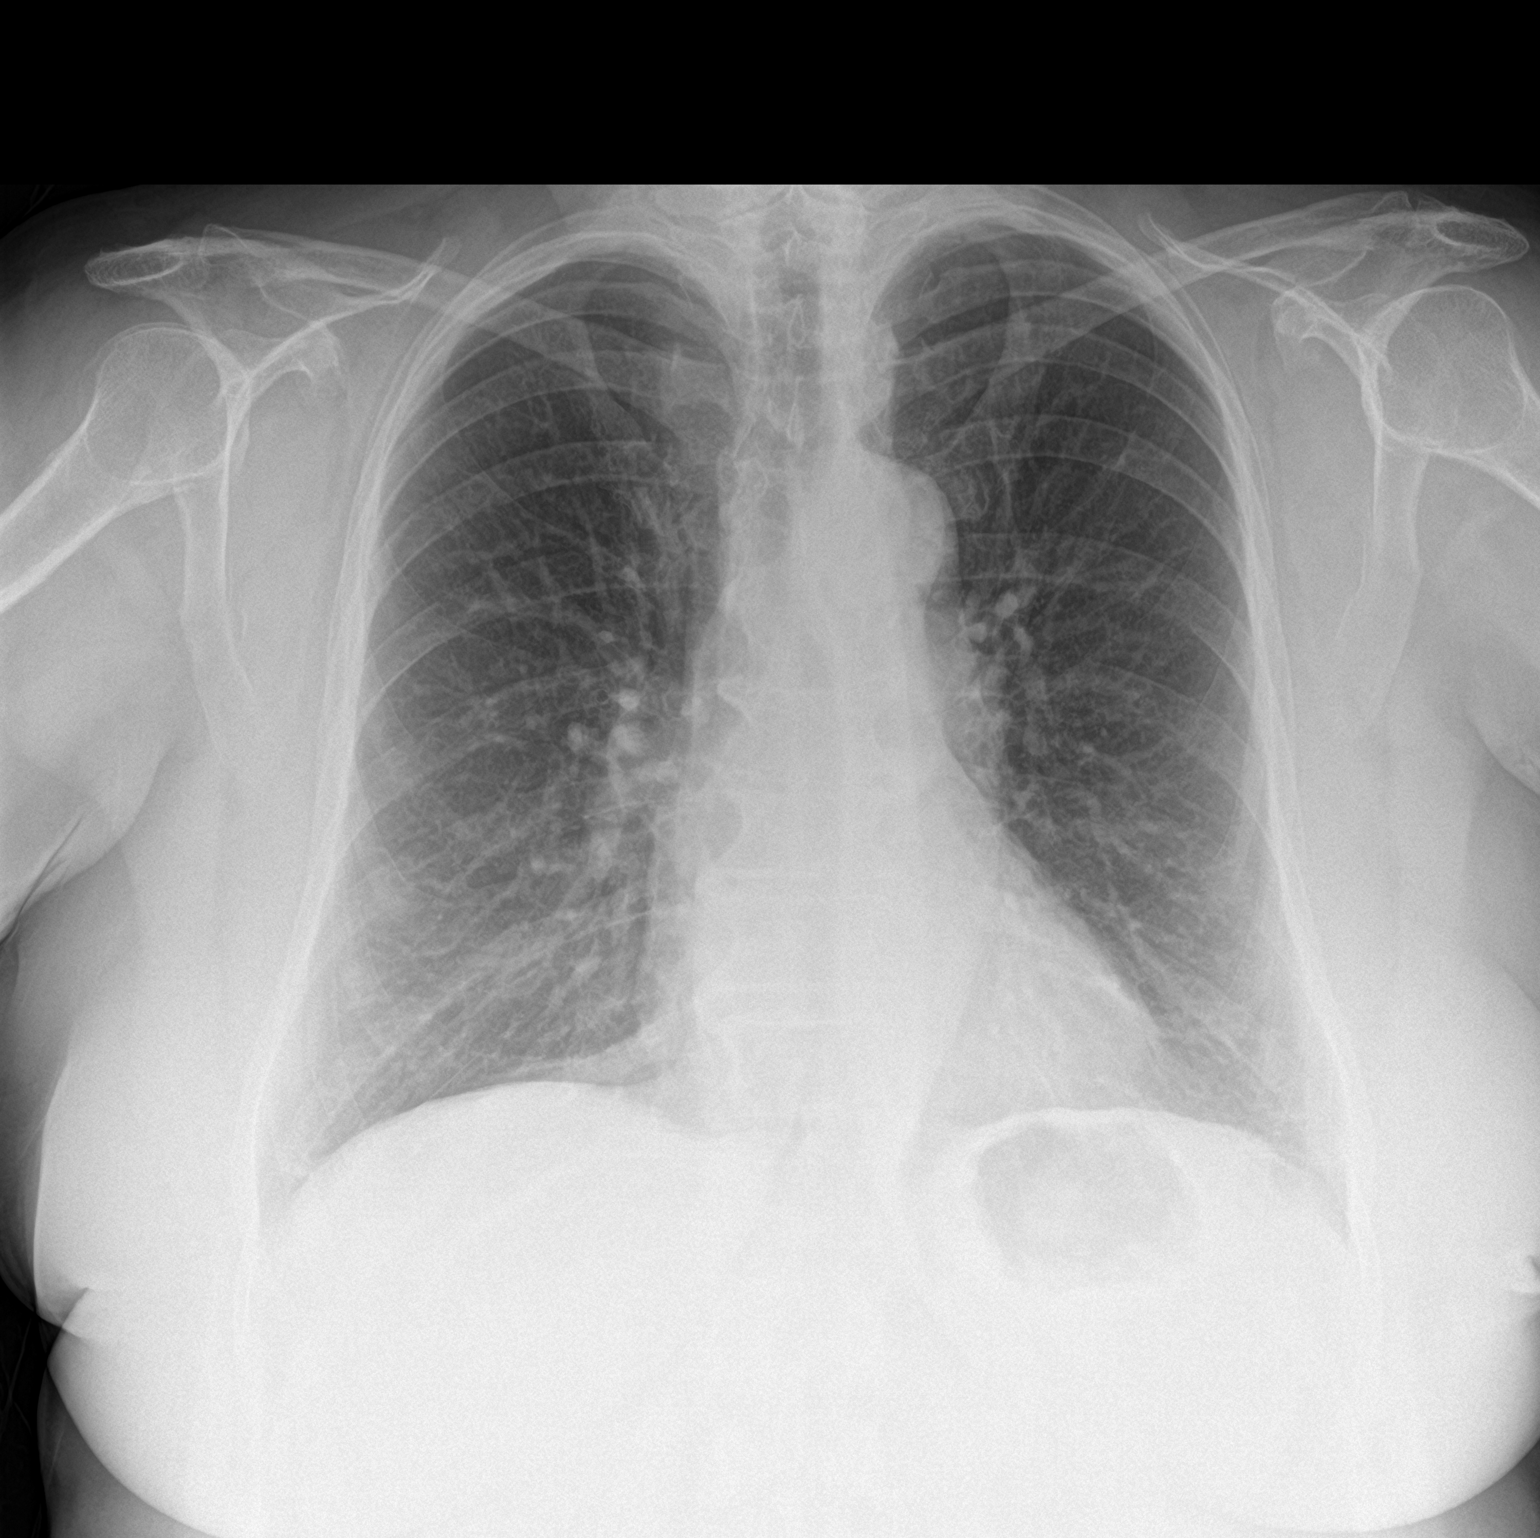

[chest lat]
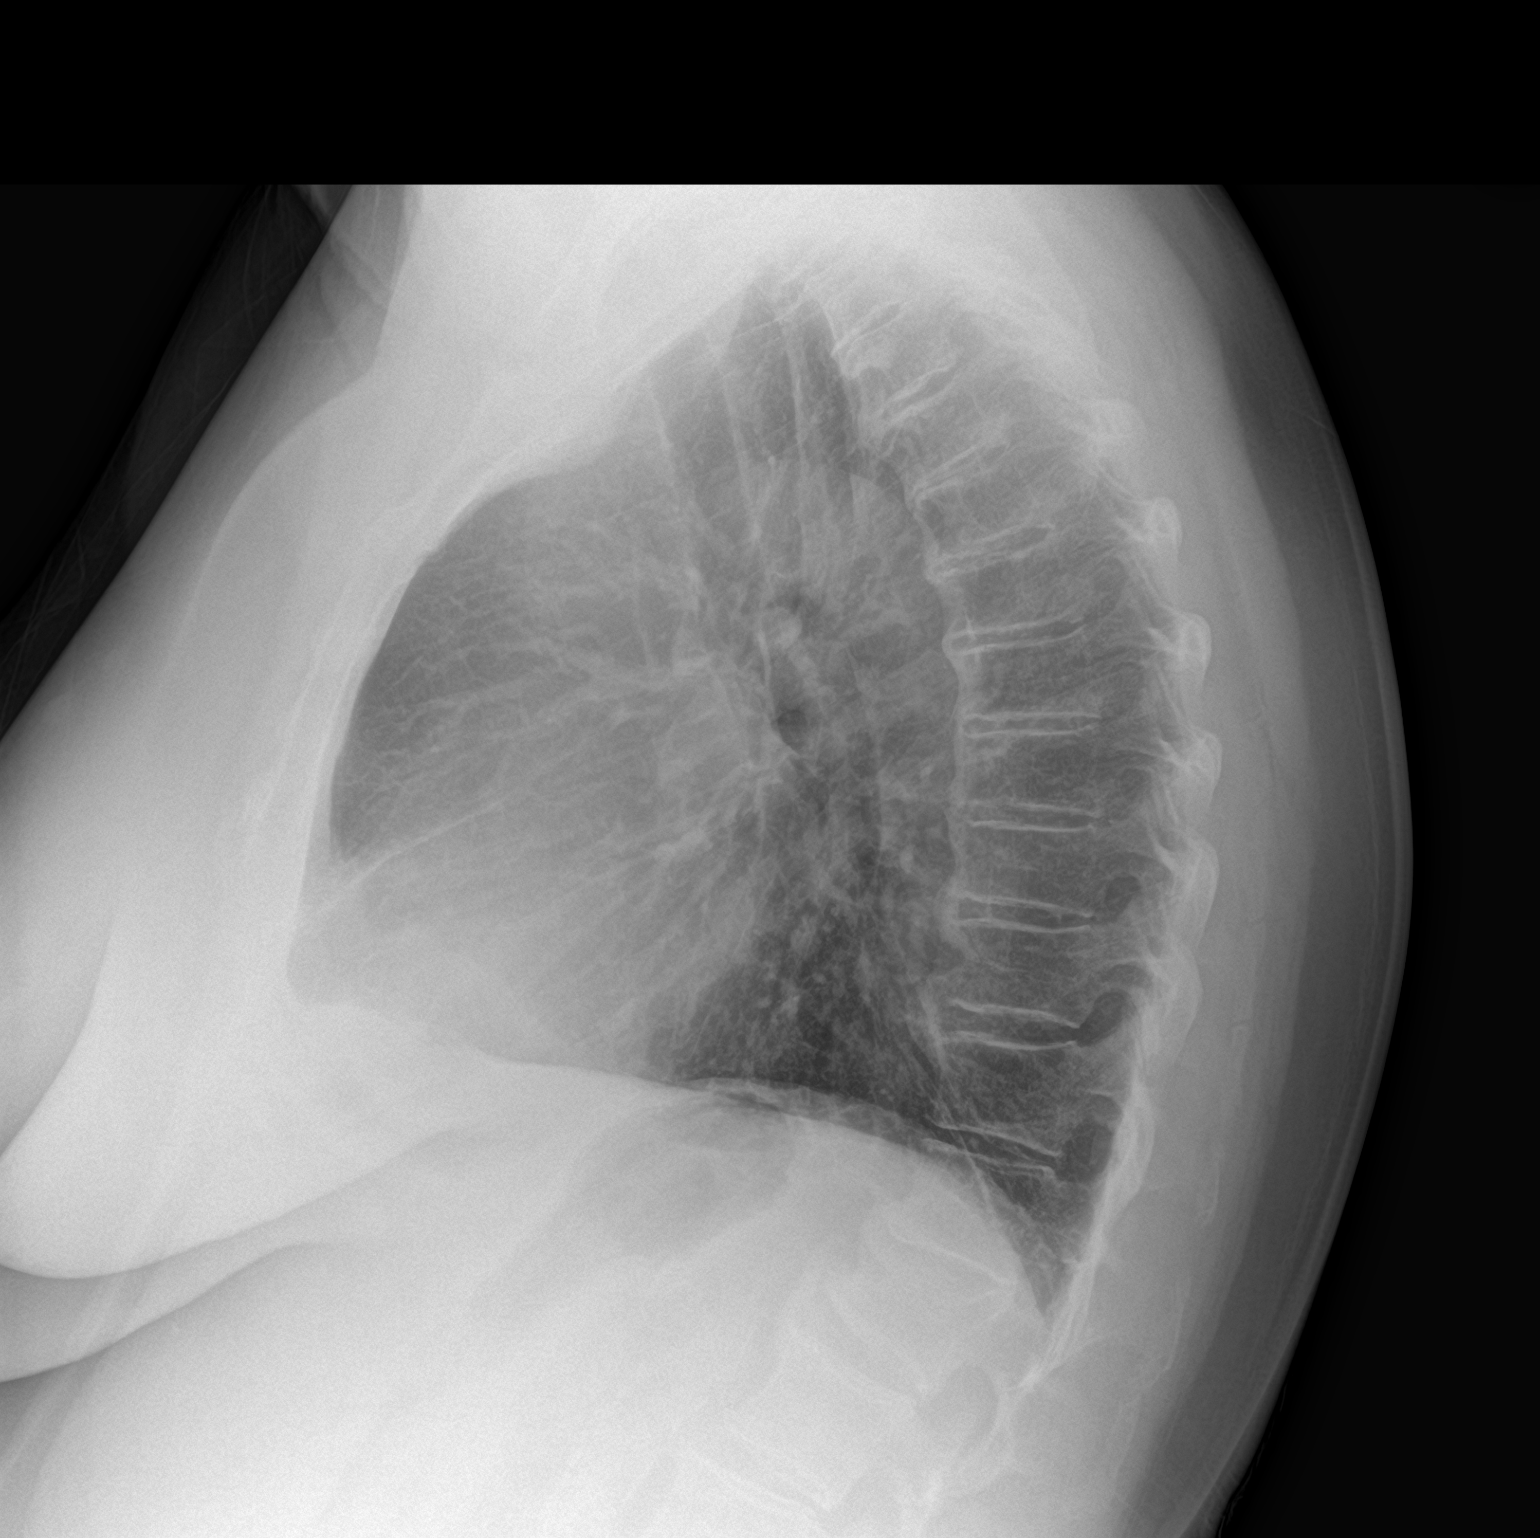

[2 of 2 positions shown; findings below may reference images not displayed]

FINDINGS: Cardiomediastinal silhouette unchanged in size and contour. No
evidence of central vascular congestion. No interlobular septal
thickening. No pneumothorax. No pleural effusion. No confluent
airspace disease.

Coarsened interstitial markings, unchanged from prior. No new
nodularity. The previous nodule question on the left side is not
visualized on the current.

Degenerative changes of the spine
IMPRESSION: Negative for acute cardiopulmonary disease

The previously questioned nodule in the left lung is not visualized
on the current plain film.

## 2021-04-16 NOTE — Progress Notes (Signed)
Cardiology Office Note Date:  04/18/2021  Patient ID:  Jacqueline [name] October 10, 1951, MRN [id number] PCP:  Deland Pretty, MD  Electrophysiologist: Dr. Rayann Heman    Chief Complaint: annual visit  History of Present Illness: Jacqueline [name] is a 70 y.o. female with history of HTN, HLD, OSA w/CPAP, AFib/flutter, renal cell Ca s/p L nephrectomy  She comes in today to be seen for Dr. Rayann Heman, last seen by him 03/25/2020,  She was doing well, ILR battery was depleted and removed at that visit.  Planned for annual APP visit.  TODAY She is doing very well. Walks her dog ddaily though mentions the dog stops frequently so not always sucha quick pace. No CP, palpitations or ardiac awareness, has good exertional capacity and no difficulties with her ADLs No bleeding or signs of bleeding No dizzy spells, near syncope or syncope.  Looking forward to spending time this [place]    AFib Hx Diagnosed 2016 PVI/CTI  ablation Jan and Oct 2017  AAD Flecainide started 2016 >> stopped Aug 2016 2/2 bradycardia, pauses associated with dizziness   Past Medical History:  Diagnosis Date  . Arthritis    Spine and Bil hands  . Compression fracture of L1 lumbar vertebra ([facility]) 01/30/2018   CXR  . Fatty liver   . Gait abnormality   . GERD (gastroesophageal reflux disease)   . History of hiatal hernia   . HTN (hypertension)   . Hyperlipidemia   . OSA on CPAP   . Paroxysmal atrial fibrillation (HCC)   . Plantar fasciitis, left   . Pneumonia    History of   . PONV (postoperative nausea and vomiting)   . Pre-diabetes   . Skin rash    under bil breast  . Stress incontinence, female   . Typical atrial flutter [facility])     Past Surgical History:  Procedure Laterality Date  . ANTERIOR AND POSTERIOR VAGINAL REPAIR    . BLADDER SURGERY    . BURCH PROCEDURE    . COLONOSCOPY    . ELECTROPHYSIOLOGIC STUDY N/A 12/22/2015   CTI and PVI ablation by Dr Rayann Heman  . ELECTROPHYSIOLOGIC STUDY N/A  10/02/2016   Procedure: Atrial Fibrillation Ablation;  Surgeon: [name], MD;  Location: [place] CV LAB;  Service: Cardiovascular;  Laterality: N/A;  . EP IMPLANTABLE DEVICE N/A 08/11/2015   Procedure: Loop Recorder Insertion;  Surgeon: [name], MD;  Location: [place] CV LAB;  Service: Cardiovascular;  Laterality: N/A;  . implantable loop recorder removal  03/25/2020   MDT Reveal LINQ removed in office by Dr Rayann Heman  . ROBOT ASSISTED LAPAROSCOPIC NEPHRECTOMY Left 03/07/2018   Procedure: XI ROBOTIC ASSISTED LAPAROSCOPIC NEPHRECTOMY;  Surgeon: Cleon Gustin, MD;  Location: [facility];  Service: Urology;  Laterality: Left;  Marland Kitchen VAGINAL HYSTERECTOMY      Current Outpatient Medications  Medication Sig Dispense Refill  . acetaminophen (TYLENOL) 500 MG tablet Take 500 mg by mouth daily as needed for mild pain or moderate pain.     Marland Kitchen atorvastatin (LIPITOR) 20 MG tablet Take 10 mg by mouth daily.     . cetirizine (ZYRTEC) 10 MG tablet Take 10 mg by mouth daily.      Marland Kitchen denosumab (PROLIA) 60 MG/ML SOSY injection Inject 60 mg into the skin every 6 (six) months.    . famotidine (PEPCID) 20 MG tablet Take 20 mg by mouth 2 (two) times daily.    . fluticasone (FLONASE) 50 MCG/ACT nasal spray Place  1 spray into both nostrils daily as needed for allergies or rhinitis.    . hydrochlorothiazide (HYDRODIURIL) 12.5 MG tablet Take 12.5 mg by mouth daily.    Marland Kitchen losartan (COZAAR) 25 MG tablet Take 75 mg by mouth daily.    . pramipexole (MIRAPEX) 0.125 MG tablet Take 0.25 mg by mouth every evening. 3 hours before bedtime  2  . Probiotic Product (PROBIOTIC PO) Take 1 capsule by mouth daily.    . Rivaroxaban (XARELTO) 15 MG TABS tablet Take 1 tablet (15 mg total) by mouth daily with supper. 90 tablet 1   No current facility-administered medications for this visit.    Allergies:   Lisinopril   Social History:  The patient  reports that she has never smoked. She has never used smokeless tobacco. She  reports current alcohol use. She reports that she does not use drugs.   Family History:  The patient's family history includes Bradycardia in her father; Breast cancer in her mother; Colon cancer in her mother; Diabetes in her maternal grandmother and paternal grandmother; Heart block in her father; Heart disease in her paternal uncle.  ROS:  Please see the history of present illness.    All other systems are reviewed and otherwise negative.   PHYSICAL EXAM:  VS:  BP 130/70   Pulse (!) 54   Ht 5\' 6"  (1.676 m)   Wt 207 lb 6.4 oz (94.1 kg)   SpO2 96%   BMI 33.48 kg/m  BMI: Body mass index is 33.48 kg/m. Well nourished, well developed, in no acute distress HEENT: normocephalic, atraumatic Neck: no JVD, carotid bruits or masses Cardiac:  RRR; no significant murmurs, no rubs, or gallops Lungs:  CTA b/l, no wheezing, rhonchi or rales Abd: soft, nontender MS: no deformity or atrophy Ext: no edema Skin: warm and dry, no rash Neuro:  No gross deficits appreciated Psych: euthymic mood, full affect    EKG:  Done today and reviewed by myself shows  SB 54bpm, no changes   12/22/2015: EPS/PVI ablation CONCLUSIONS: 1. Sinus rhythm upon presentation.   2. Prodigious conduction arising from the right superior pulmonary vein.  This was likely her culprit for afib 3. Successful electrical isolation and anatomical encircling of all four pulmonary veins with radiofrequency current.    4. Cavo-tricuspid isthmus ablation was performed with complete bidirectional isthmus block achieved.  5. No inducible arrhythmias following ablation both on and off of dobutamine  6. No early apparent complications.   12/22/2015; EPS/ablation CONCLUSIONS: 1. Sinus rhythm upon presentation.   2. Prodigious conduction arising from the right superior pulmonary vein.  This was likely her culprit for afib 3. Successful electrical isolation and anatomical encircling of all four pulmonary veins with radiofrequency  current.    4. Cavo-tricuspid isthmus ablation was performed with complete bidirectional isthmus block achieved.  5. No inducible arrhythmias following ablation both on and off of dobutamine  6. No early apparent complications.    05/20/2015: EST  No T wave inversion was noted during stress.  Upsloping ST segment depression ST segment depression of 0.5 mm was noted during stress in the inferolateral leads (II, III, aVF, V5 and V6), beginning at 4 minutes of stress, and returning to baseline after 1-5 minutes of recovery.      05/20/2015: TTE Study Conclusions  - Left ventricle: The cavity size was normal. Wall thickness was  normal. Systolic function was normal. The estimated ejection  fraction was in the range of 55% to 60%.  - Mitral  valve: There was mild regurgitation.   Recent Labs: No results found for requested labs within last 8760 hours.  No results found for requested labs within last 8760 hours.   CrCl cannot be calculated (Patient's most recent lab result is older than the maximum 21 days allowed.).   Wt Readings from Last 3 Encounters:  04/18/21 207 lb 6.4 oz (94.1 kg)  04/05/20 204 lb (92.5 kg)  03/25/20 203 lb 12.8 oz (92.4 kg)     Other studies reviewed: Additional studies/records reviewed today include: summarized above  ASSESSMENT AND PLAN:  1. Paroxysmal Afib     CHA2DS2Vasc is 3, on Xarelto, appropriately dosed by last available labs      No symptoms of her AFib, with a are and fleeting flutter only      Sees and has labs done by her PMD every 6 months, will request her most recent for our records  2. HTN     Looks good, no changes  3. HLD     Not addressed today, her PMD does her labs/management   Disposition: F/u with [place] annually, sooner if needed  Current medicines are reviewed at length with the patient today.  The patient did not have any concerns regarding medicines.  Venetia Night, PA-C 04/18/2021 3:00 PM     [place] [address] [phone] (office)  [phone] (fax)

## 2021-04-18 ENCOUNTER — Ambulatory Visit (INDEPENDENT_AMBULATORY_CARE_PROVIDER_SITE_OTHER): Payer: Medicare Other | Admitting: Physician Assistant

## 2021-04-18 ENCOUNTER — Other Ambulatory Visit: Payer: Self-pay

## 2021-04-18 ENCOUNTER — Encounter: Payer: Self-pay | Admitting: Physician Assistant

## 2021-04-18 VITALS — BP 130/70 | HR 54 | Ht 66.0 in | Wt 207.4 lb

## 2021-04-18 DIAGNOSIS — I10 Essential (primary) hypertension: Secondary | ICD-10-CM | POA: Diagnosis not present

## 2021-04-18 DIAGNOSIS — I480 Paroxysmal atrial fibrillation: Secondary | ICD-10-CM | POA: Diagnosis not present

## 2021-04-18 MED ORDER — RIVAROXABAN 15 MG PO TABS: 15.0000 mg | ORAL_TABLET | Freq: Every day | ORAL | 1 refills | Status: DC

## 2021-04-18 NOTE — Patient Instructions (Signed)

## 2021-08-03 ENCOUNTER — Other Ambulatory Visit: Payer: Self-pay | Admitting: Urology

## 2021-08-03 DIAGNOSIS — R933 Abnormal findings on diagnostic imaging of other parts of digestive tract: Secondary | ICD-10-CM

## 2021-08-03 DIAGNOSIS — Z85528 Personal history of other malignant neoplasm of kidney: Secondary | ICD-10-CM

## 2021-09-22 ENCOUNTER — Other Ambulatory Visit: Payer: Self-pay

## 2021-09-22 ENCOUNTER — Ambulatory Visit
Admission: RE | Admit: 2021-09-22 | Discharge: 2021-09-22 | Disposition: A | Discharge status: Home / Self Care | Payer: Medicare Other | Source: Ambulatory Visit | Attending: Urology | Admitting: Urology

## 2021-09-22 DIAGNOSIS — R933 Abnormal findings on diagnostic imaging of other parts of digestive tract: Secondary | ICD-10-CM

## 2021-09-22 DIAGNOSIS — Z85528 Personal history of other malignant neoplasm of kidney: Secondary | ICD-10-CM

## 2021-09-22 IMAGING — MR MR ABDOMEN WO/W CM
12 of 18 series · 30 of 48 positions shown · IV contrast (Multihance)
Comparison: [DATE]

CLINICAL DATA: Pancreatic cystic lesion and right renal cyst. Prior
left nephrectomy for renal cell carcinoma.

EXAM:
MRI ABDOMEN WITHOUT AND WITH CONTRAST
TECHNIQUE: Multiplanar multisequence MR imaging of the abdomen was performed
both before and after the administration of intravenous contrast.
CONTRAST:  19mL MULTIHANCE GADOBENATE DIMEGLUMINE 529 MG/ML IV SOLN

[Series 3: T2 · axial · 6.0mm · 0.74mm/px · 1 of 47 slices shown (1 of 3)]
[im 1/47]
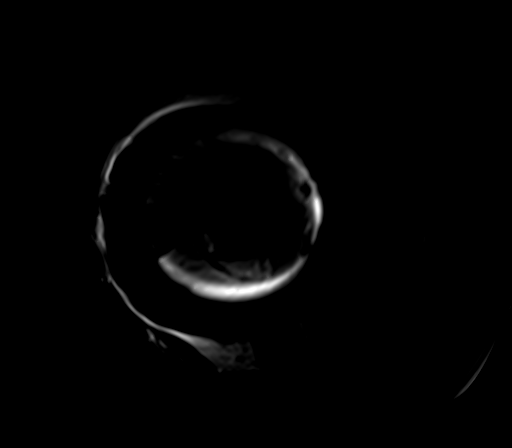

[Series 4: T2 · coronal · 5.0mm · 1.56mm/px · 1 of 44 slices shown (2 of 3)]
[im 1/44]
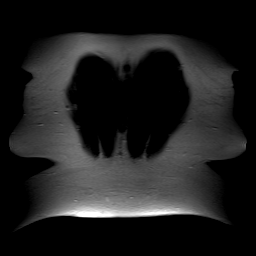

[Series 5: T2 · axial · 5.0mm · 1.41mm/px · z∈[-165,+78]mm · 2 of 40 slices shown (3 of 3)]
[im 1/40]
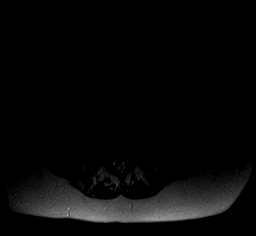
[im 40/40]
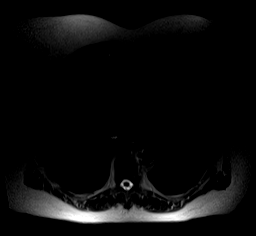

[Series 6: ep2d_diff_b50_500_800_p2_trig · axial · 6.0mm · 1.98mm/px · z∈[-150,+131]mm · 5 of 120 slices shown]
[im 1/120]
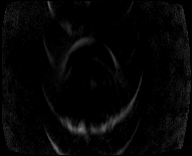
[im 30/120]
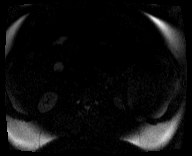
[im 60/120]
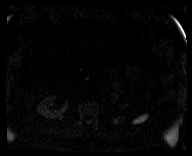
[im 90/120]
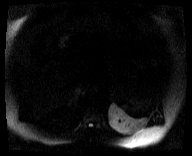
[im 120/120]
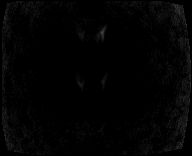

[Series 7: ep2d_diff_b50_500_800_p2_trig_adc · axial · 6.0mm · 1.98mm/px · z∈[-150,+131]mm · 2 of 40 slices shown]
[im 1/40]
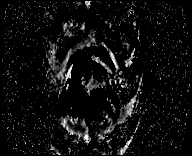
[im 40/40]
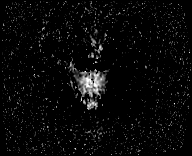

[Series 8: axial in out · axial · 6.0mm · 0.74mm/px · z∈[-166,+79]mm · 3 of 72 slices shown]
[im 1/72]
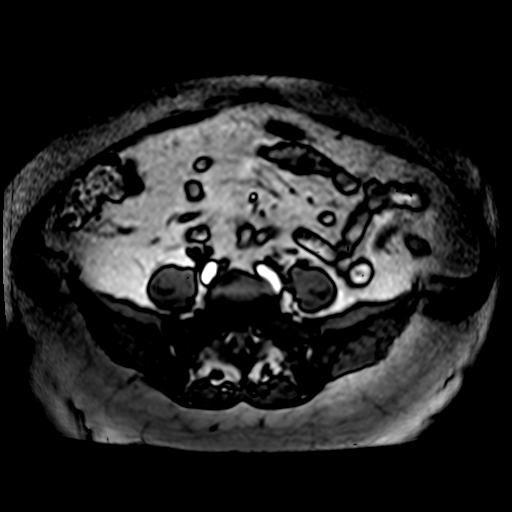
[im 36/72]
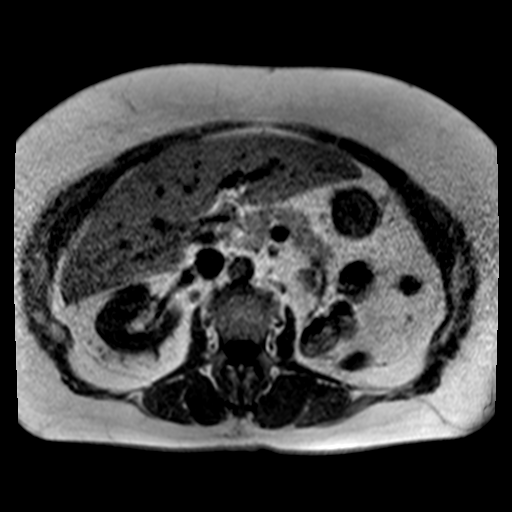
[im 72/72]
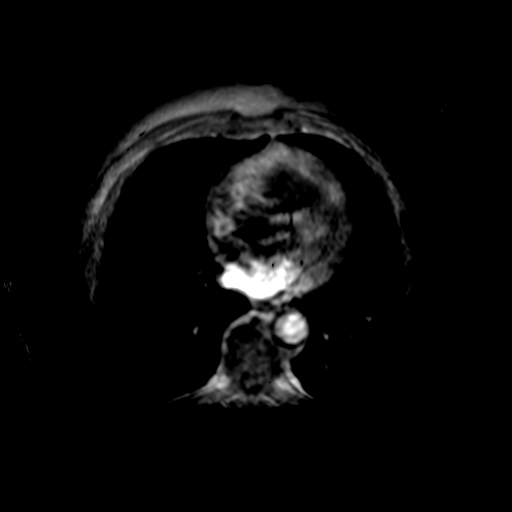

[Series 9: axial tru fisp · axial · 5.0mm · 1.41mm/px · z∈[-156,+69]mm · 2 of 40 slices shown]
[im 1/40]
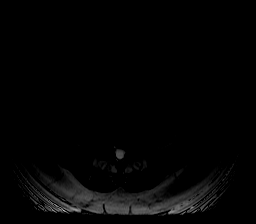
[im 40/40]
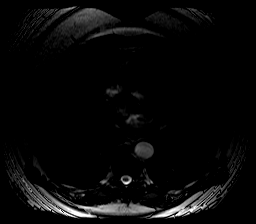

[Series 12: T1 dynamic · axial · non-contrast · 3.0mm · 0.78mm/px · z∈[-155,+82]mm · 3 of 80 slices shown]
[im 1/80]
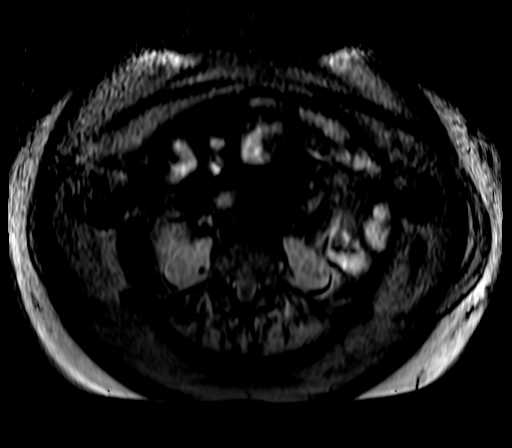
[im 40/80]
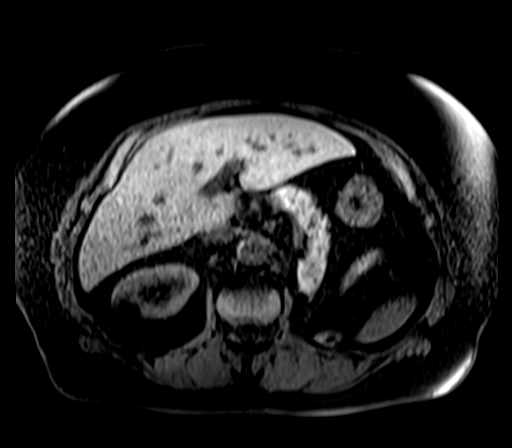
[im 80/80]
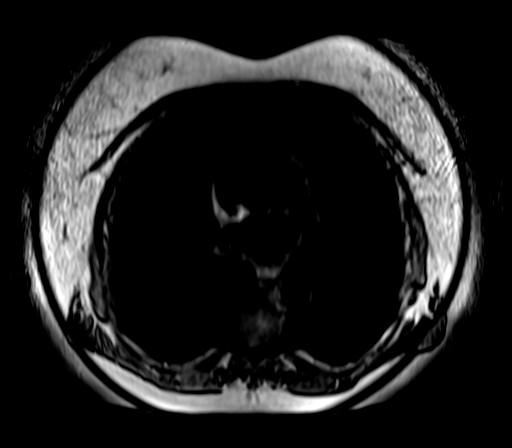

[Series 13: post 25 sec · axial · 3.0mm · 0.78mm/px · z∈[-155,+82]mm · 3 of 80 slices shown]
[im 1/80]
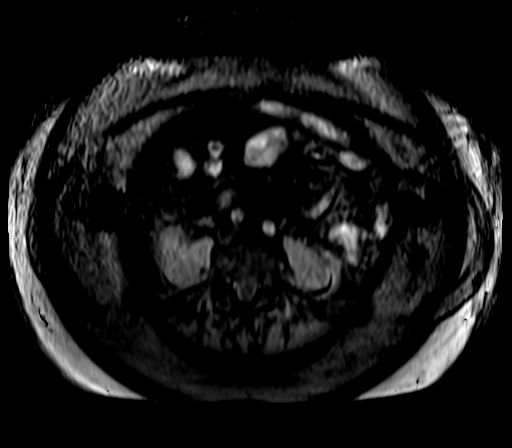
[im 40/80]
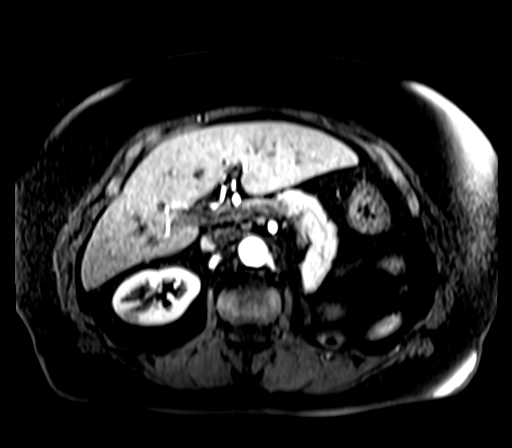
[im 80/80]
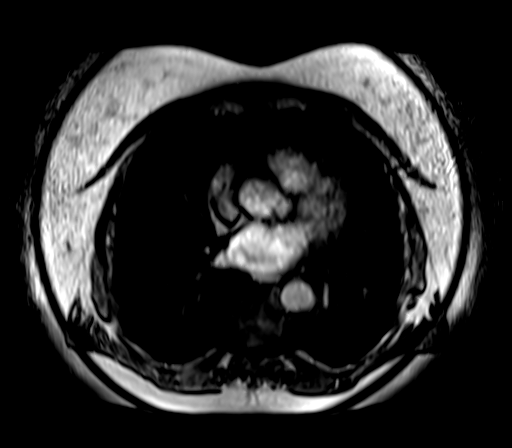

[Series 14: post 25 sec_sub · axial · 3.0mm · 0.78mm/px · z∈[-155,+82]mm · 3 of 80 slices shown]
[im 1/80]
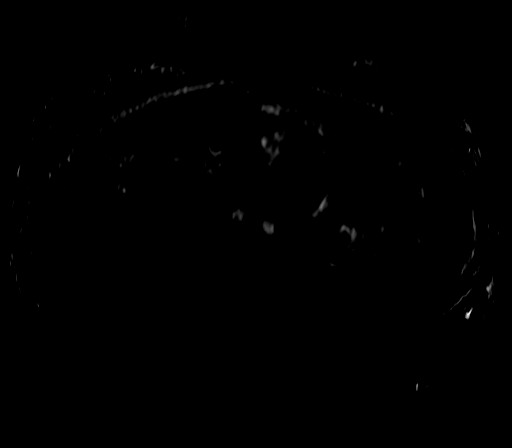
[im 40/80]
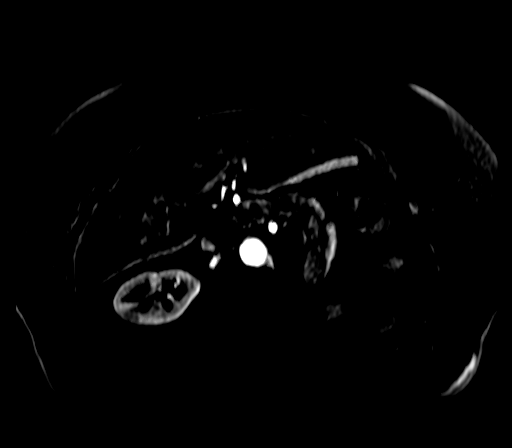
[im 80/80]
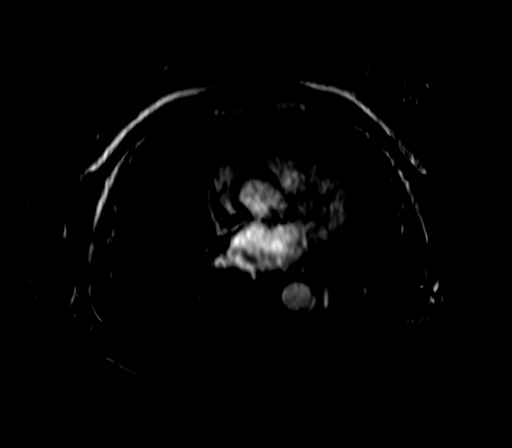

[Series 15: post 45 sec · axial · 3.0mm · 0.78mm/px · z∈[-155,+82]mm · 3 of 80 slices shown]
[im 1/80]
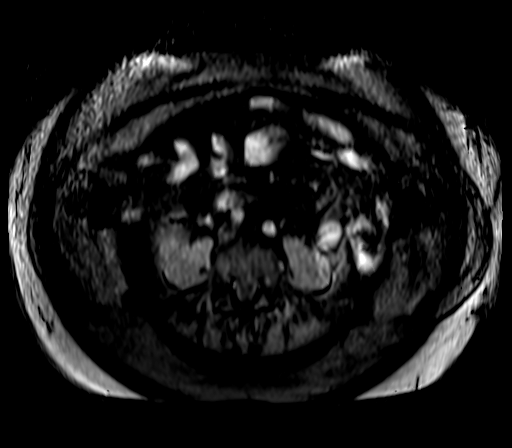
[im 40/80]
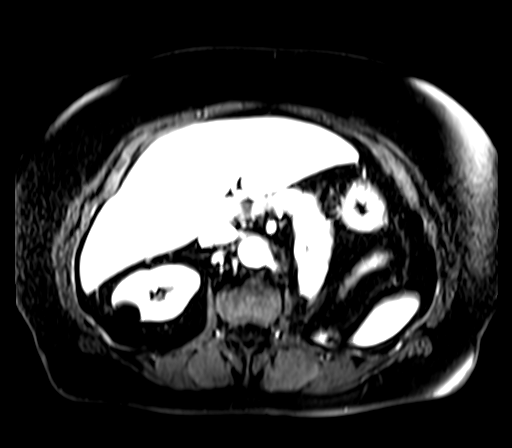
[im 80/80]
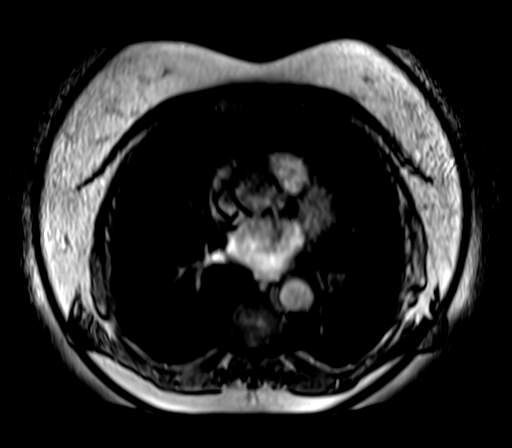

[Series 16: post 45 sec_sub · axial · 3.0mm · 0.78mm/px · z∈[-155,-38]mm · 2 of 80 slices shown]
[im 1/80]
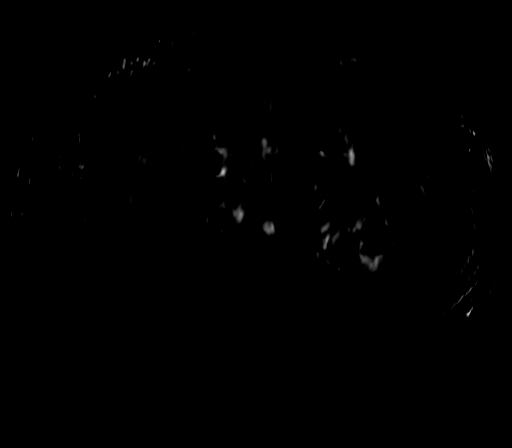
[im 40/80]
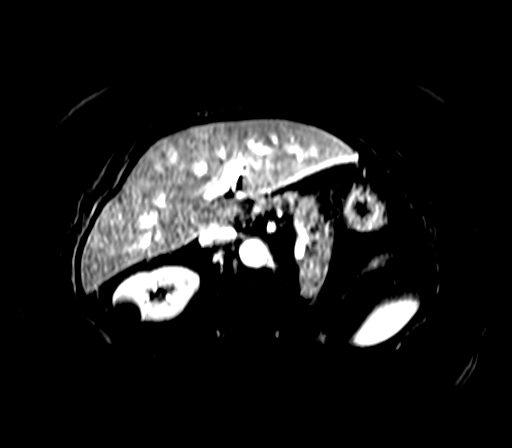

[30 of 48 positions shown; findings below may reference images not displayed]

FINDINGS: Lower chest: Unremarkable

Hepatobiliary: 1.0 cm simple right hepatic lobe cyst on image 28
series [DATE] cm right hepatic lobe cyst on image 24 series 3.

Diffuse hepatic steatosis. Small depending gallstones in the
gallbladder.

Pancreas: 0.6 cm cystic lesion in the pancreatic tail, no observed
enhancement. 0.5 cm cystic lesion at the junction of the pancreatic
body and head on image 26 series 5, no observed enhancement. No
definite connectivity to the dorsal pancreatic duct.

Spleen:  Unremarkable

Adrenals/Urinary Tract: Adrenal glands unremarkable. Prior left
nephrectomy. Simple cyst of the right kidney upper pole 2.8 by
cm on image 15 series 5. Minimally complex but benign cyst of the
right kidney lower pole 1.7 by 1.6 cm on image 25 series 5, Bosniak
category 2. No worrisome right renal lesions are identified.

Stomach/Bowel: Unremarkable

Vascular/Lymphatic: Small lymph node or small enhancing vascular
structure along the umbilical vein/ligamentum teres hepatic scar,
stable from [DATE], thought to be benign and incidental.

Other:  No supplemental non-categorized findings.

Musculoskeletal: Chronic L1 anterior compression fracture. Lumbar
spondylosis and degenerative disc disease.
IMPRESSION: 1. There are 2 small cystic lesions in the pancreas, larger
measuring 0.6 cm in diameter. No change from [DATE]. Follow up
pancreatic protocol MRI is recommended in 2 years time. INSERT
reference pancreas
2. Benign right renal cysts, not requiring further workup.
3. Benign hepatic cysts, not requiring further workup.
4. Small focus of enhancement along the ligamentum teres
hepatocytes, no change from early [77], compatible with small focal
venous varix or similar benign lesion. This does not require further
workup.
5. Chronic L1 compression. Lumbar spondylosis and degenerative disc
disease.
6. Diffuse hepatic steatosis.
7. Cholelithiasis.

## 2021-09-22 MED ORDER — GADOBENATE DIMEGLUMINE 529 MG/ML IV SOLN
19.0000 mL | Freq: Once | INTRAVENOUS | Status: AC | PRN
  Administered 2021-09-22: 19 mL via INTRAVENOUS

## 2021-11-16 ENCOUNTER — Other Ambulatory Visit: Payer: Self-pay | Admitting: Physician Assistant

## 2021-11-17 NOTE — Telephone Encounter (Signed)
Prescription refill request for Xarelto received.  Indication: Afib  Last office visit:04/18/21 Charlcie Cradle)  Weight: 94.1kg Age: 70 Scr: 1.6 (01/25/21) CrCl: 47.75ml/min   Appropriate dose and refill sent to requested pharmacy.

## 2022-04-18 ENCOUNTER — Other Ambulatory Visit: Payer: Self-pay | Admitting: Internal Medicine

## 2022-04-18 ENCOUNTER — Other Ambulatory Visit: Payer: Self-pay

## 2022-04-18 NOTE — Telephone Encounter (Addendum)
Xarelto '15mg'$  refill was sent on today by another staff member today for a 30 day supply. Pt is 71 years old, weight-94.1kg, Crea-1.50 on 07/24/2021 via scanned labs, last seen by Tommye Standard on 04/18/2021, Diagnosis-Afib, CrCl-50.71m/min.  ? ?Will evaluate for further refills after appt on 04/20/2022. ? ?Per Renee's note on 04/20/2022, it states:  ?Calc CrCl <51 would be appropriate for '15mg'$  dosing. ?Feb is 523 though appears creat tends to be higher. ?I will ask my MA to reach out to her and discuss.  Will need to monitor closely for her Xarelto dosing ?No change at this time and see when she has labs scheduled next so we can follow as well. ? ?04/20/2022-[facility] for the pt to call back regarding Xarelto dose & labs.  ?

## 2022-04-18 NOTE — Progress Notes (Addendum)
 Cardiology Office Note Date:  04/20/2022  Patient ID:  Jacqueline [name], Jacqueline [name] 05-Feb-1951, MRN [id number] PCP:  Merri Brunette, MD  Electrophysiologist: Dr. Johney Frame Nephrology: Dr. Kathrene Bongo     Chief Complaint:  annual visit  History of Present Illness: Jacqueline [name] is a 71 y.o. female with history of HTN, HLD, OSA w/CPAP, AFib/flutter, renal cell Ca s/p L nephrectomy  She comes in today to be seen for Dr. Johney Frame, last seen by him 03/25/2020,  She was doing well, ILR battery was depleted and removed at that visit.  Planned for annual APP visit.  I saw her may 2022 She is doing very well. Walks her dog ddaily though mentions the dog stops frequently so not always such a quick pace. No CP, palpitations or ardiac awareness, has good exertional capacity and no difficulties with her ADLs No bleeding or signs of bleeding No dizzy spells, near syncope or syncope. Looking forward to spending time this summer at her beach home NO changes were made She was following labs, lipids with her PMD  TODAY She continues to well. Again, walking the dog, does yard work, walks on [employer] in the sand when they are outther, no exertional intolerances. No unusual fatigue, no near syncoe or sncope No SOB No CP No bleeding or signs of bleeding  She has labs done regularly between her PMD, nephrologist and urologiist   AFib Hx Diagnosed 2016 Loop implanted 08/11/2015 > removed 2021 PVI/CTI  ablation Jan and Oct 2017  AAD Flecainide started 2016 >> stopped Aug 2016 2/2 bradycardia, pauses associated with dizziness   Past Medical History:  Diagnosis Date   Arthritis    Spine and Bil hands   Compression fracture of L1 lumbar vertebra (HCC) 01/30/2018   CXR   Fatty liver    Gait abnormality    GERD (gastroesophageal reflux disease)    History of hiatal hernia    HTN (hypertension)    Hyperlipidemia    OSA on CPAP    Paroxysmal atrial fibrillation (HCC)    Plantar fasciitis, left     Pneumonia    History of    PONV (postoperative nausea and vomiting)    Pre-diabetes    Skin rash    under bil breast   Stress incontinence, female    Typical atrial flutter (HCC)     Past Surgical History:  Procedure Laterality Date   ANTERIOR AND POSTERIOR VAGINAL REPAIR     BLADDER SURGERY     BURCH PROCEDURE     COLONOSCOPY     ELECTROPHYSIOLOGIC STUDY N/A 12/22/2015   CTI and PVI ablation by Dr Johney Frame   ELECTROPHYSIOLOGIC STUDY N/A 10/02/2016   Procedure: Atrial Fibrillation Ablation;  Surgeon: Hillis Range, MD;  Location: [facility] IN[facility]ASIVE [facility];  Service: Cardiovascular;  Laterality: N/A;   EP IMPLANTABLE DEVICE N/A 08/11/2015   Procedure: Loop Recorder Insertion;  Surgeon: Hillis Range, MD;  Location: [facility] IN[facility];  Service: Cardiovascular;  Laterality: N/A;   implantable loop recorder removal  03/25/2020   MDT Reveal LINQ removed in office by Dr Johney Frame   ROBOT ASSISTED LAPAROSCOPIC NEPHRECTOMY Left 03/07/2018   Procedure: XI ROBOTIC ASSISTED LAPAROSCOPIC NEPHRECTOMY;  Surgeon: Malen Gauze, MD;  Location: [facility];  Service: Urology;  Laterality: Left;   VAGINAL HYSTERECTOMY      Current Outpatient Medications  Medication Sig Dispense Refill   acetaminophen (TYLENOL) 500 MG tablet Take 500 mg by mouth daily as needed for mild pain or moderate pain.  atorvastatin (LIPITOR) 20 MG tablet Take 10 mg by mouth daily.      cetirizine (ZYRTEC) 10 MG tablet Take 10 mg by mouth daily.       denosumab (PROLIA) 60 MG/ML SOSY injection Inject 60 mg into the skin every 6 (six) months.     fluticasone (FLONASE) 50 MCG/ACT nasal spray Place 1 spray into both nostrils daily as needed for allergies or rhinitis.     hydrochlorothiazide (HYDRODIURIL) 12.5 MG tablet Take 12.5 mg by mouth daily.     losartan (COZAAR) 25 MG tablet Take 75 mg by mouth daily.     pantoprazole (PROTONIX) 40 MG tablet Take 40 mg by mouth daily.     pramipexole (MIRAPEX) 0.125 MG tablet Take 0.25 mg by mouth  every evening. 3 hours before bedtime  2   Probiotic Product (PROBIOTIC PO) Take 1 capsule by mouth daily.     XARELTO 15 MG TABS tablet TAKE 1 TABLET DAILY WITH   SUPPER 30 tablet 0   famotidine (PEPCID) 20 MG tablet Take 20 mg by mouth 2 (two) times daily. (Patient not taking: Reported on 04/20/2022)     No current facility-administered medications for this visit.    Allergies:   Lisinopril   Social History:  The patient  reports that she has never smoked. She has never used smokeless tobacco. She reports current alcohol use. She reports that she does not use drugs.   Family History:  The patient's family history includes Bradycardia in her father; Breast cancer in her mother; Colon cancer in her mother; Diabetes in her maternal grandmother and paternal grandmother; Heart block in her father; Heart disease in her paternal uncle.  ROS:  Please see the history of present illness.    All other systems are reviewed and otherwise negative.   PHYSICAL EXAM:  VS:  BP 114/72   Pulse (!) 45   Ht 5\' 6"  (1.676 m)   Wt 204 lb 12.8 oz (92.9 kg)   SpO2 96%   BMI 33.06 kg/m  BMI: Body mass index is 33.06 kg/m. Well nourished, well developed, in no acute distress HEENT: normocephalic, atraumatic Neck: no JVD, carotid bruits or masses Cardiac:  RRR; no significant murmurs, no rubs, or gallops Lungs:  CTA b/l, no wheezing, rhonchi or rales Abd: soft, nontender MS: no deformity or atrophy Ext: no edema Skin: warm and dry, no rash Neuro:  No gross deficits appreciated Psych: euthymic mood, full affect    EKG:  Done today and reviewed by myself shows  SB 45bpm, othewise normal intervals  10/02/2016: EPS/ablation CONCLUSIONS: 1. Sinus rhythm upon presentation.   2. Return of electrical conduction within the right superior pulmonary vein which was the culprit for this patients atrial fibrillation.  The RIPV, LSPV, and LIPV were quiescent from a prior ablation procedure. 3. Successful  electrical isolation and anatomical encircling of the RSPV with RF 4. No inducible arrhythmias following ablation both on and off of Isuprel.  Adenosine was also administered.  The patient was observed for over 20 minutes without return of conduction in the RSPV 5. CTI block persists from a prior ablation procedure. 6. No early apparent complications.   12/22/2015; EPS/ablation CONCLUSIONS: 1. Sinus rhythm upon presentation.   2. Prodigious conduction arising from the right superior pulmonary vein.  This was likely her culprit for afib 3. Successful electrical isolation and anatomical encircling of all four pulmonary veins with radiofrequency current.    4. Cavo-tricuspid isthmus ablation was performed with complete bidirectional  isthmus block achieved.  5. No inducible arrhythmias following ablation both on and off of dobutamine  6. No early apparent complications.     05/20/2015: EST No T wave inversion was noted during stress. Upsloping ST segment depression ST segment depression of 0.5 mm was noted during stress in the inferolateral leads (II, III, aVF, V5 and V6), beginning at 4 minutes of stress, and returning to baseline after 1-5 minutes of recovery.      05/20/2015: TTE Study Conclusions  - Left ventricle: The cavity size was normal. Wall thickness was    normal. Systolic function was normal. The estimated ejection    fraction was in the range of 55% to 60%.  - Mitral valve: There was mild regurgitation.   Recent Labs: No results found for requested labs within last 8760 hours.  No results found for requested labs within last 8760 hours.   CrCl cannot be calculated (Patient's most recent lab result is older than the maximum 21 days allowed.).   Wt Readings from Last 3 Encounters:  04/20/22 204 lb 12.8 oz (92.9 kg)  04/18/21 207 lb 6.4 oz (94.1 kg)  04/05/20 204 lb (92.5 kg)     Other studies reviewed: Additional studies/records reviewed today include: summarized  above  ASSESSMENT AND PLAN:  1. Paroxysmal Afib     CHA2DS2Vasc is 3, on Xarelto, appropriately dosed by last available labs      No symptoms of her AFib, with a rare and fleeting flutter only  Will ask for her last labs from her PMD  ADDEND: Labs received  Dated  01/25/22 Creat 1.47 (calc CrCl is 51 (using today's weight) 07/24/21 1.50 (Calc CrCl 50, using today's weight) 01/25/21 1.6 (Calc CrCl using toay's weight 46)  Calc CrCl <51 would be appropriate for 15mg  dosing. Feb is 82, though appears creat tends to be higher. I will ask my MA to reach out to her and discuss.  Will need to monitor closely for her Xarelto dosing No change at this time and see when she has labs scheduled next so we can follow as well.    2. HTN     Looks good, no changes  3. HLD     Not addressed today, her PMD does her labs/management  4. Sinus bradycardia Asymptomatic We discussed she may need a PPM down the line, though with no symptoms, not indicated currently She will let us know should she develop any    Disposition: back in clinic in a year, sooner if needed   Current medicines are reviewed at length with the patient today.  The patient did not have any concerns regarding medicines.  Norma Fredrickson, PA-C 04/20/2022 9:43 AM     [facility] [address] [place] [address] [phone] (office)  [phone] (fax)

## 2022-04-20 ENCOUNTER — Encounter: Payer: Self-pay | Admitting: Physician Assistant

## 2022-04-20 ENCOUNTER — Telehealth: Payer: Self-pay | Admitting: *Deleted

## 2022-04-20 ENCOUNTER — Ambulatory Visit (INDEPENDENT_AMBULATORY_CARE_PROVIDER_SITE_OTHER): Payer: Medicare Other | Admitting: Physician Assistant

## 2022-04-20 VITALS — BP 114/72 | HR 45 | Ht 66.0 in | Wt 204.8 lb

## 2022-04-20 DIAGNOSIS — I10 Essential (primary) hypertension: Secondary | ICD-10-CM | POA: Diagnosis not present

## 2022-04-20 DIAGNOSIS — R001 Bradycardia, unspecified: Secondary | ICD-10-CM

## 2022-04-20 DIAGNOSIS — I480 Paroxysmal atrial fibrillation: Secondary | ICD-10-CM

## 2022-04-20 NOTE — Telephone Encounter (Signed)
Lvm for patient to call direct number [phone]. To discuss correct dose for meds and lab follow up ? ?

## 2022-04-20 NOTE — Patient Instructions (Signed)
Medication Instructions:  ? ?Your physician recommends that you continue on your current medications as directed. Please refer to the Current Medication list given to you today. ? ? ?*If you need a refill on your cardiac medications before your next appointment, please call your pharmacy* ? ? ?Lab Work: [place] ? ? ? ?If you have labs (blood work) drawn today and your tests are completely normal, you will receive your results only by: ?MyChart Message (if you have MyChart) OR ?A paper copy in the mail ?If you have any lab test that is abnormal or we need to change your treatment, we will call you to review the results. ? ? NONE ORDERED  TODAY ? ?Testing/Procedures: ? ? ? ? ?Follow-Up: ?At [facility], you and your health needs are our priority.  As part of our continuing mission to provide you with exceptional heart care, we have created designated Provider Care Teams.  These Care Teams include your primary Cardiologist (physician) and Advanced Practice Providers (APPs -  Physician Assistants and Nurse Practitioners) who all work together to provide you with the care you need, when you need it. ? ?We recommend signing up for the patient portal called "MyChart".  Sign up information is provided on this After Visit Summary.  MyChart is used to connect with patients for Virtual Visits (Telemedicine).  Patients are able to view lab/test results, encounter notes, upcoming appointments, etc.  Non-urgent messages can be sent to your provider as well.   ?To learn more about what you can do with MyChart, go to [email or web address].   ? ?Your next appointment:   ?1 year(s) ? ?The format for your next appointment:   ?In Person ? ?Provider:   ?You may see  Dr. Rayann Heman  one of the following Advanced Practice Providers on your designated Care Team:   ?Tommye Standard, PA-C ? ? ? ? ?Other Instructions ? ? ?Important Information About Sugar ? ? ? ? ?  ?

## 2022-04-20 NOTE — Telephone Encounter (Signed)
-----   Message from [place], [place] sent at 04/20/2022 10:53 AM EDT ----- ?Pleasee my note.  Please call the patient, let her know her kidney function is appropriate for '15mg'$  dose, but her Feb lab was a little better, right on the border to increase her dose.  When does she have labs scheduled again?   ?

## 2022-05-01 DIAGNOSIS — E1121 Type 2 diabetes mellitus with diabetic nephropathy: Secondary | ICD-10-CM | POA: Diagnosis not present

## 2022-05-08 DIAGNOSIS — N1831 Chronic kidney disease, stage 3a: Secondary | ICD-10-CM | POA: Diagnosis not present

## 2022-05-08 DIAGNOSIS — E1121 Type 2 diabetes mellitus with diabetic nephropathy: Secondary | ICD-10-CM | POA: Diagnosis not present

## 2022-05-08 DIAGNOSIS — Z905 Acquired absence of kidney: Secondary | ICD-10-CM | POA: Diagnosis not present

## 2022-05-08 DIAGNOSIS — I10 Essential (primary) hypertension: Secondary | ICD-10-CM | POA: Diagnosis not present

## 2022-05-08 DIAGNOSIS — E7800 Pure hypercholesterolemia, unspecified: Secondary | ICD-10-CM | POA: Diagnosis not present

## 2022-05-08 DIAGNOSIS — I4891 Unspecified atrial fibrillation: Secondary | ICD-10-CM | POA: Diagnosis not present

## 2022-05-08 DIAGNOSIS — G4733 Obstructive sleep apnea (adult) (pediatric): Secondary | ICD-10-CM | POA: Diagnosis not present

## 2022-05-24 ENCOUNTER — Other Ambulatory Visit: Payer: Self-pay | Admitting: Internal Medicine

## 2022-05-24 DIAGNOSIS — I480 Paroxysmal atrial fibrillation: Secondary | ICD-10-CM

## 2022-05-25 NOTE — Telephone Encounter (Addendum)
Prescription refill request for Xarelto received.  Indication: Afib  Last office visit: 04/20/22 Charlcie Cradle)  Weight: 92.9kg Age: 71 Scr:1.63 (05/01/22)  CrCl: 45.08  Appropriate dose and refill sent to requested pharmacy.

## 2022-06-05 DIAGNOSIS — Z905 Acquired absence of kidney: Secondary | ICD-10-CM | POA: Diagnosis not present

## 2022-06-05 DIAGNOSIS — E1121 Type 2 diabetes mellitus with diabetic nephropathy: Secondary | ICD-10-CM | POA: Diagnosis not present

## 2022-06-05 DIAGNOSIS — I4891 Unspecified atrial fibrillation: Secondary | ICD-10-CM | POA: Diagnosis not present

## 2022-06-05 DIAGNOSIS — E7800 Pure hypercholesterolemia, unspecified: Secondary | ICD-10-CM | POA: Diagnosis not present

## 2022-06-05 DIAGNOSIS — I10 Essential (primary) hypertension: Secondary | ICD-10-CM | POA: Diagnosis not present

## 2022-06-29 DIAGNOSIS — G4733 Obstructive sleep apnea (adult) (pediatric): Secondary | ICD-10-CM | POA: Diagnosis not present

## 2022-07-20 DIAGNOSIS — E119 Type 2 diabetes mellitus without complications: Secondary | ICD-10-CM | POA: Diagnosis not present

## 2022-07-20 DIAGNOSIS — R300 Dysuria: Secondary | ICD-10-CM | POA: Diagnosis not present

## 2022-07-20 DIAGNOSIS — E7801 Familial hypercholesterolemia: Secondary | ICD-10-CM | POA: Diagnosis not present

## 2022-07-20 DIAGNOSIS — Z0000 Encounter for general adult medical examination without abnormal findings: Secondary | ICD-10-CM | POA: Diagnosis not present

## 2022-07-20 DIAGNOSIS — Z1231 Encounter for screening mammogram for malignant neoplasm of breast: Secondary | ICD-10-CM | POA: Diagnosis not present

## 2022-07-20 DIAGNOSIS — R5383 Other fatigue: Secondary | ICD-10-CM | POA: Diagnosis not present

## 2022-07-31 DIAGNOSIS — Z0000 Encounter for general adult medical examination without abnormal findings: Secondary | ICD-10-CM | POA: Diagnosis not present

## 2022-07-31 DIAGNOSIS — M810 Age-related osteoporosis without current pathological fracture: Secondary | ICD-10-CM | POA: Diagnosis not present

## 2022-07-31 DIAGNOSIS — I10 Essential (primary) hypertension: Secondary | ICD-10-CM | POA: Diagnosis not present

## 2022-07-31 DIAGNOSIS — K219 Gastro-esophageal reflux disease without esophagitis: Secondary | ICD-10-CM | POA: Diagnosis not present

## 2022-07-31 DIAGNOSIS — E1121 Type 2 diabetes mellitus with diabetic nephropathy: Secondary | ICD-10-CM | POA: Diagnosis not present

## 2022-07-31 DIAGNOSIS — N1831 Chronic kidney disease, stage 3a: Secondary | ICD-10-CM | POA: Diagnosis not present

## 2022-07-31 DIAGNOSIS — E7800 Pure hypercholesterolemia, unspecified: Secondary | ICD-10-CM | POA: Diagnosis not present

## 2022-07-31 DIAGNOSIS — Z905 Acquired absence of kidney: Secondary | ICD-10-CM | POA: Diagnosis not present

## 2022-08-23 DIAGNOSIS — M810 Age-related osteoporosis without current pathological fracture: Secondary | ICD-10-CM | POA: Diagnosis not present

## 2022-09-05 DIAGNOSIS — E7800 Pure hypercholesterolemia, unspecified: Secondary | ICD-10-CM | POA: Diagnosis not present

## 2022-09-05 DIAGNOSIS — E1121 Type 2 diabetes mellitus with diabetic nephropathy: Secondary | ICD-10-CM | POA: Diagnosis not present

## 2022-09-05 DIAGNOSIS — I4891 Unspecified atrial fibrillation: Secondary | ICD-10-CM | POA: Diagnosis not present

## 2022-09-05 DIAGNOSIS — Z905 Acquired absence of kidney: Secondary | ICD-10-CM | POA: Diagnosis not present

## 2022-09-05 DIAGNOSIS — I10 Essential (primary) hypertension: Secondary | ICD-10-CM | POA: Diagnosis not present

## 2022-09-08 DIAGNOSIS — Z23 Encounter for immunization: Secondary | ICD-10-CM | POA: Diagnosis not present

## 2022-09-24 DIAGNOSIS — M654 Radial styloid tenosynovitis [de Quervain]: Secondary | ICD-10-CM | POA: Diagnosis not present

## 2022-09-24 DIAGNOSIS — M13841 Other specified arthritis, right hand: Secondary | ICD-10-CM | POA: Diagnosis not present

## 2022-09-24 DIAGNOSIS — M25531 Pain in right wrist: Secondary | ICD-10-CM | POA: Diagnosis not present

## 2022-09-26 DIAGNOSIS — D225 Melanocytic nevi of trunk: Secondary | ICD-10-CM | POA: Diagnosis not present

## 2022-09-26 DIAGNOSIS — L723 Sebaceous cyst: Secondary | ICD-10-CM | POA: Diagnosis not present

## 2022-09-26 DIAGNOSIS — L578 Other skin changes due to chronic exposure to nonionizing radiation: Secondary | ICD-10-CM | POA: Diagnosis not present

## 2022-09-26 DIAGNOSIS — L570 Actinic keratosis: Secondary | ICD-10-CM | POA: Diagnosis not present

## 2022-09-26 DIAGNOSIS — L821 Other seborrheic keratosis: Secondary | ICD-10-CM | POA: Diagnosis not present

## 2022-09-26 DIAGNOSIS — D2230 Melanocytic nevi of unspecified part of face: Secondary | ICD-10-CM | POA: Diagnosis not present

## 2022-09-26 DIAGNOSIS — D2261 Melanocytic nevi of right upper limb, including shoulder: Secondary | ICD-10-CM | POA: Diagnosis not present

## 2022-09-26 DIAGNOSIS — L814 Other melanin hyperpigmentation: Secondary | ICD-10-CM | POA: Diagnosis not present

## 2022-10-22 ENCOUNTER — Ambulatory Visit (HOSPITAL_COMMUNITY)
Admission: RE | Admit: 2022-10-22 | Discharge: 2022-10-22 | Disposition: A | Discharge status: Home / Self Care | Payer: Medicare Other | Source: Ambulatory Visit | Attending: Urology | Admitting: Urology

## 2022-10-22 ENCOUNTER — Other Ambulatory Visit (HOSPITAL_COMMUNITY): Payer: Self-pay | Admitting: Urology

## 2022-10-22 DIAGNOSIS — Z85528 Personal history of other malignant neoplasm of kidney: Secondary | ICD-10-CM

## 2022-10-22 DIAGNOSIS — M47814 Spondylosis without myelopathy or radiculopathy, thoracic region: Secondary | ICD-10-CM | POA: Diagnosis not present

## 2022-10-22 DIAGNOSIS — I10 Essential (primary) hypertension: Secondary | ICD-10-CM | POA: Diagnosis not present

## 2022-10-22 DIAGNOSIS — Z905 Acquired absence of kidney: Secondary | ICD-10-CM | POA: Diagnosis not present

## 2022-10-29 DIAGNOSIS — N183 Chronic kidney disease, stage 3 unspecified: Secondary | ICD-10-CM | POA: Diagnosis not present

## 2022-10-29 DIAGNOSIS — Z85528 Personal history of other malignant neoplasm of kidney: Secondary | ICD-10-CM | POA: Diagnosis not present

## 2022-10-29 DIAGNOSIS — N393 Stress incontinence (female) (male): Secondary | ICD-10-CM | POA: Diagnosis not present

## 2022-10-29 DIAGNOSIS — N281 Cyst of kidney, acquired: Secondary | ICD-10-CM | POA: Diagnosis not present

## 2022-10-30 ENCOUNTER — Other Ambulatory Visit: Payer: Self-pay | Admitting: Urology

## 2022-10-30 DIAGNOSIS — R933 Abnormal findings on diagnostic imaging of other parts of digestive tract: Secondary | ICD-10-CM

## 2022-11-23 ENCOUNTER — Other Ambulatory Visit: Payer: Self-pay | Admitting: Internal Medicine

## 2022-11-23 DIAGNOSIS — I480 Paroxysmal atrial fibrillation: Secondary | ICD-10-CM

## 2022-11-23 NOTE — Telephone Encounter (Signed)
Xarelto '15mg'$  refill request received. Pt is 71 years old, weight-92.9kg, Crea-1.63 on 05/01/2022 via scanned labs, last seen by Tommye Standard on 04/20/2022, Diagnosis-Afib, CrCl-45.08 mL/min; Dose is appropriate based on dosing criteria. Will send in refill to requested pharmacy.

## 2022-12-05 DIAGNOSIS — I10 Essential (primary) hypertension: Secondary | ICD-10-CM | POA: Diagnosis not present

## 2022-12-05 DIAGNOSIS — E1121 Type 2 diabetes mellitus with diabetic nephropathy: Secondary | ICD-10-CM | POA: Diagnosis not present

## 2022-12-05 DIAGNOSIS — Z905 Acquired absence of kidney: Secondary | ICD-10-CM | POA: Diagnosis not present

## 2022-12-05 DIAGNOSIS — E7800 Pure hypercholesterolemia, unspecified: Secondary | ICD-10-CM | POA: Diagnosis not present

## 2022-12-05 DIAGNOSIS — I4891 Unspecified atrial fibrillation: Secondary | ICD-10-CM | POA: Diagnosis not present

## 2023-01-29 DIAGNOSIS — E1121 Type 2 diabetes mellitus with diabetic nephropathy: Secondary | ICD-10-CM | POA: Diagnosis not present

## 2023-01-29 DIAGNOSIS — E7800 Pure hypercholesterolemia, unspecified: Secondary | ICD-10-CM | POA: Diagnosis not present

## 2023-02-05 DIAGNOSIS — G4733 Obstructive sleep apnea (adult) (pediatric): Secondary | ICD-10-CM | POA: Diagnosis not present

## 2023-02-05 DIAGNOSIS — K219 Gastro-esophageal reflux disease without esophagitis: Secondary | ICD-10-CM | POA: Diagnosis not present

## 2023-02-05 DIAGNOSIS — E1121 Type 2 diabetes mellitus with diabetic nephropathy: Secondary | ICD-10-CM | POA: Diagnosis not present

## 2023-02-05 DIAGNOSIS — E7800 Pure hypercholesterolemia, unspecified: Secondary | ICD-10-CM | POA: Diagnosis not present

## 2023-02-05 DIAGNOSIS — Z905 Acquired absence of kidney: Secondary | ICD-10-CM | POA: Diagnosis not present

## 2023-02-05 DIAGNOSIS — I4891 Unspecified atrial fibrillation: Secondary | ICD-10-CM | POA: Diagnosis not present

## 2023-02-05 DIAGNOSIS — M810 Age-related osteoporosis without current pathological fracture: Secondary | ICD-10-CM | POA: Diagnosis not present

## 2023-02-05 DIAGNOSIS — I10 Essential (primary) hypertension: Secondary | ICD-10-CM | POA: Diagnosis not present

## 2023-02-05 DIAGNOSIS — R0789 Other chest pain: Secondary | ICD-10-CM | POA: Diagnosis not present

## 2023-02-11 DIAGNOSIS — K219 Gastro-esophageal reflux disease without esophagitis: Secondary | ICD-10-CM | POA: Diagnosis not present

## 2023-02-11 DIAGNOSIS — E1122 Type 2 diabetes mellitus with diabetic chronic kidney disease: Secondary | ICD-10-CM | POA: Diagnosis not present

## 2023-02-11 DIAGNOSIS — N183 Chronic kidney disease, stage 3 unspecified: Secondary | ICD-10-CM | POA: Diagnosis not present

## 2023-02-11 DIAGNOSIS — I129 Hypertensive chronic kidney disease with stage 1 through stage 4 chronic kidney disease, or unspecified chronic kidney disease: Secondary | ICD-10-CM | POA: Diagnosis not present

## 2023-02-11 DIAGNOSIS — Z85528 Personal history of other malignant neoplasm of kidney: Secondary | ICD-10-CM | POA: Diagnosis not present

## 2023-02-28 DIAGNOSIS — M810 Age-related osteoporosis without current pathological fracture: Secondary | ICD-10-CM | POA: Diagnosis not present

## 2023-03-26 DIAGNOSIS — E119 Type 2 diabetes mellitus without complications: Secondary | ICD-10-CM | POA: Diagnosis not present

## 2023-05-06 ENCOUNTER — Other Ambulatory Visit: Payer: Self-pay | Admitting: Physician Assistant

## 2023-05-06 DIAGNOSIS — I480 Paroxysmal atrial fibrillation: Secondary | ICD-10-CM

## 2023-05-06 NOTE — Telephone Encounter (Signed)
Prescription refill request for Xarelto received.  Indication: a fib Last office visit: 04/20/22 overdue Weight: 204 Age: 72 Scr: 1.53 CrCl: 48 ml/min

## 2023-05-07 DIAGNOSIS — E1121 Type 2 diabetes mellitus with diabetic nephropathy: Secondary | ICD-10-CM | POA: Diagnosis not present

## 2023-05-07 DIAGNOSIS — I10 Essential (primary) hypertension: Secondary | ICD-10-CM | POA: Diagnosis not present

## 2023-05-07 DIAGNOSIS — E7800 Pure hypercholesterolemia, unspecified: Secondary | ICD-10-CM | POA: Diagnosis not present

## 2023-05-07 DIAGNOSIS — I4891 Unspecified atrial fibrillation: Secondary | ICD-10-CM | POA: Diagnosis not present

## 2023-05-07 DIAGNOSIS — Z905 Acquired absence of kidney: Secondary | ICD-10-CM | POA: Diagnosis not present

## 2023-06-03 NOTE — Progress Notes (Unsigned)
Electrophysiology Office Note:   Date:  06/04/2023  ID:  Jacqueline [name], DOB January 31, 1951, MRN [id number]  Primary Cardiologist: Hillis Range, MD (Inactive) Electrophysiologist: None      History of Present Illness:   Jacqueline [name] is a 72 y.o. female with h/o OSA on CPAP, Renal Cell CA s/p L Nephrectomy, HTN, HLD, PAF, Typical A-Flutter/SVT s/p PVI/CTI ablation, who presents 6/18 for routine follow up.   She was last seen in clinic on 04/18/22 and was doing largely well. She was having rare episodes of flutter that were fleeting. Noted bradycardia but was asymptomatic. Based on her CrCl, she has been on Xarelto 15mg  for anticoagulation.   Returns 6/18 for follow up. Reports she is doing well.  Labs/Cr stable, monitored by her PCP and Urology. Reports she was seen by her PCP and having an "ache" in her chest.  EKG was reportedly normal per pt. She notes "terrible acid reflux" and was started on additional H2 blocker which resolved symptoms. She notes she has occasional fleeting palpitations that resolve spontaneously. Denies chest pain, lightheadedness, dizziness, pre-syncope/syncope, SOB. Tolerating Xarelto well, no episodes of bleeding. No issues with BP medications. BP runs 120's systolic at home.   Review of systems complete and found to be negative unless listed in HPI.   EP Information / Studies Reviewed:    Studies:  ECHO 2016 > LVEF 55-60%, mild MR EST 05/2015 > no T wave inversion, upsloping ST segment depression ST segment depression of 0.5 mm was noted during stress in the inferolateral leads (II, III, aVF, V5 and V6), beginning at 4 minutes of stress, and returning to baseline after 1-5 minutes of recovery.  EKG 04/2022 > SB 45  EKG 06/04/23 > personal review shows SB, low voltage QRS, no ST changes  AF Hx Dx 2016, loop implanted  Jan & Oct 2017 PVI, CTI ablation 03/25/2020 Loop Recorder removed in clinic  AAD / Anticoagulation 2016 Flecainide initiated, stopped 07/2015 due to  bradycardia, pauses associated with dizziness Xarelto 15mg  (hx nephrectomy)  Risk Assessment/Calculations:    CHA2DS2-VASc Score = 3   This indicates a 3.2% annual risk of stroke. The patient's score is based upon: CHF History: 0 HTN History: 1 Diabetes History: 0 Stroke History: 0 Vascular Disease History: 0 Age Score: 1 Gender Score: 1    HYPERTENSION CONTROL Vitals:   06/04/23 0832 06/04/23 0858  BP: (!) 152/76 (!) 138/90    The patient's blood pressure is elevated above target today.  In order to address the patient's elevated BP: Blood pressure will be monitored at home to determine if medication changes need to be made.; Follow up with primary care provider for management.           Physical Exam:   VS:  BP (!) 138/90 (BP Location: Right Arm)   Pulse (!) 53   Ht 5\' 6"  (1.676 m)   Wt 198 lb (89.8 kg)   SpO2 95%   BMI 31.96 kg/m    Wt Readings from Last 3 Encounters:  06/04/23 198 lb (89.8 kg)  04/20/22 204 lb 12.8 oz (92.9 kg)  04/18/21 207 lb 6.4 oz (94.1 kg)     GEN: Well nourished, well developed in no acute distress NECK: No JVD; No carotid bruits CARDIAC: Regular rate and rhythm, no murmurs, rubs, gallops RESPIRATORY:  Clear to auscultation without rales, wheezing or rhonchi  ABDOMEN: Soft, non-tender, non-distended EXTREMITIES:  No edema; No deformity   ASSESSMENT AND PLAN:    Paroxysmal Atrial  Fibrillation  S/p PVI/CTI ablation Jan & Oct 2017, CHA2DS2VASC 3 -occasional palpitations, infrequent, resolves quickly -continue xarelto 15mg , labs followed by her PCP & Dr. Annabell Howells   Sinus Bradycardia  -SB, asymptomatic  -avoid nodal blocking agents for BP mgmt  HTN  HLD  -BP re-check improved, reports home BP 120's/70's, defer to PCP/Nephro for further mgmt -continue rosuvastatin, hydrochlorothiazide, losartan per primary    Follow up with Dr. Elberta Fortis in 12 months  Signed, Canary Brim, MSN, APRN, NP-C, AGACNP-BC [facility] -  Electrophysiology  06/04/2023, 9:43 AM

## 2023-06-04 ENCOUNTER — Ambulatory Visit: Payer: Medicare Other | Attending: Physician Assistant | Admitting: Pulmonary Disease

## 2023-06-04 ENCOUNTER — Encounter: Payer: Self-pay | Admitting: Pulmonary Disease

## 2023-06-04 VITALS — BP 138/90 | HR 53 | Ht 66.0 in | Wt 198.0 lb

## 2023-06-04 DIAGNOSIS — I480 Paroxysmal atrial fibrillation: Secondary | ICD-10-CM | POA: Diagnosis not present

## 2023-06-04 DIAGNOSIS — R001 Bradycardia, unspecified: Secondary | ICD-10-CM | POA: Diagnosis not present

## 2023-06-04 DIAGNOSIS — I10 Essential (primary) hypertension: Secondary | ICD-10-CM | POA: Insufficient documentation

## 2023-06-04 NOTE — Patient Instructions (Addendum)
Medication Instructions:   Your physician recommends that you continue on your current medications as directed. Please refer to the Current Medication list given to you today.  *If you need a refill on your cardiac medications before your next appointment, please call your pharmacy*   Lab Work:  NONE ORDERED  TODAY   If you have labs (blood work) drawn today and your tests are completely normal, you will receive your results only by: MyChart Message (if you have MyChart) OR A paper copy in the mail If you have any lab test that is abnormal or we need to change your treatment, we will call you to review the results.   Testing/Procedures: NONE ORDERED  TODAY    Follow-Up: At [facility], you and your health needs are our priority.  As part of our continuing mission to provide you with exceptional heart care, we have created designated Provider Care Teams.  These Care Teams include your primary Cardiologist (physician) and Advanced Practice Providers (APPs -  Physician Assistants and Nurse Practitioners) who all work together to provide you with the care you need, when you need it.  We recommend signing up for the patient portal called "MyChart".  Sign up information is provided on this After Visit Summary.  MyChart is used to connect with patients for Virtual Visits (Telemedicine).  Patients are able to view lab/test results, encounter notes, upcoming appointments, etc.  Non-urgent messages can be sent to your provider as well.   To learn more about what you can do with MyChart, go to [email or web address].    Your next appointment:   1 year(s)  Provider:   Will Camnitz, MD    Other Instructions  

## 2023-07-30 DIAGNOSIS — E119 Type 2 diabetes mellitus without complications: Secondary | ICD-10-CM | POA: Diagnosis not present

## 2023-07-30 DIAGNOSIS — I10 Essential (primary) hypertension: Secondary | ICD-10-CM | POA: Diagnosis not present

## 2023-07-30 DIAGNOSIS — R5383 Other fatigue: Secondary | ICD-10-CM | POA: Diagnosis not present

## 2023-07-31 LAB — LAB REPORT - SCANNED
A1c: 6.4
EGFR: 37

## 2023-08-02 DIAGNOSIS — Z1231 Encounter for screening mammogram for malignant neoplasm of breast: Secondary | ICD-10-CM | POA: Diagnosis not present

## 2023-08-06 ENCOUNTER — Encounter: Payer: Self-pay | Admitting: Registered Nurse

## 2023-08-06 DIAGNOSIS — I10 Essential (primary) hypertension: Secondary | ICD-10-CM | POA: Diagnosis not present

## 2023-08-06 DIAGNOSIS — J309 Allergic rhinitis, unspecified: Secondary | ICD-10-CM | POA: Diagnosis not present

## 2023-08-06 DIAGNOSIS — Z0000 Encounter for general adult medical examination without abnormal findings: Secondary | ICD-10-CM | POA: Diagnosis not present

## 2023-08-06 DIAGNOSIS — G4733 Obstructive sleep apnea (adult) (pediatric): Secondary | ICD-10-CM | POA: Diagnosis not present

## 2023-08-06 DIAGNOSIS — I4891 Unspecified atrial fibrillation: Secondary | ICD-10-CM | POA: Diagnosis not present

## 2023-08-06 DIAGNOSIS — E1121 Type 2 diabetes mellitus with diabetic nephropathy: Secondary | ICD-10-CM | POA: Diagnosis not present

## 2023-08-06 DIAGNOSIS — E7800 Pure hypercholesterolemia, unspecified: Secondary | ICD-10-CM | POA: Diagnosis not present

## 2023-08-06 DIAGNOSIS — N183 Chronic kidney disease, stage 3 unspecified: Secondary | ICD-10-CM | POA: Diagnosis not present

## 2023-08-06 DIAGNOSIS — Z905 Acquired absence of kidney: Secondary | ICD-10-CM | POA: Diagnosis not present

## 2023-08-06 DIAGNOSIS — M810 Age-related osteoporosis without current pathological fracture: Secondary | ICD-10-CM | POA: Diagnosis not present

## 2023-09-02 DIAGNOSIS — M810 Age-related osteoporosis without current pathological fracture: Secondary | ICD-10-CM | POA: Diagnosis not present

## 2023-09-11 DIAGNOSIS — N3001 Acute cystitis with hematuria: Secondary | ICD-10-CM | POA: Diagnosis not present

## 2023-09-11 DIAGNOSIS — C649 Malignant neoplasm of unspecified kidney, except renal pelvis: Secondary | ICD-10-CM | POA: Diagnosis not present

## 2023-09-11 DIAGNOSIS — N3000 Acute cystitis without hematuria: Secondary | ICD-10-CM | POA: Diagnosis not present

## 2023-09-25 DIAGNOSIS — L814 Other melanin hyperpigmentation: Secondary | ICD-10-CM | POA: Diagnosis not present

## 2023-09-25 DIAGNOSIS — D2230 Melanocytic nevi of unspecified part of face: Secondary | ICD-10-CM | POA: Diagnosis not present

## 2023-09-25 DIAGNOSIS — D225 Melanocytic nevi of trunk: Secondary | ICD-10-CM | POA: Diagnosis not present

## 2023-09-25 DIAGNOSIS — D2261 Melanocytic nevi of right upper limb, including shoulder: Secondary | ICD-10-CM | POA: Diagnosis not present

## 2023-09-25 DIAGNOSIS — L821 Other seborrheic keratosis: Secondary | ICD-10-CM | POA: Diagnosis not present

## 2023-09-25 DIAGNOSIS — L578 Other skin changes due to chronic exposure to nonionizing radiation: Secondary | ICD-10-CM | POA: Diagnosis not present

## 2023-09-25 DIAGNOSIS — L304 Erythema intertrigo: Secondary | ICD-10-CM | POA: Diagnosis not present

## 2023-10-22 ENCOUNTER — Ambulatory Visit
Admission: RE | Admit: 2023-10-22 | Discharge: 2023-10-22 | Disposition: A | Discharge status: Home / Self Care | Payer: Medicare Other | Source: Ambulatory Visit | Attending: Urology | Admitting: Urology

## 2023-10-22 DIAGNOSIS — K862 Cyst of pancreas: Secondary | ICD-10-CM | POA: Diagnosis not present

## 2023-10-22 DIAGNOSIS — Z85528 Personal history of other malignant neoplasm of kidney: Secondary | ICD-10-CM | POA: Diagnosis not present

## 2023-10-22 DIAGNOSIS — R933 Abnormal findings on diagnostic imaging of other parts of digestive tract: Secondary | ICD-10-CM

## 2023-10-22 MED ORDER — GADOPICLENOL 0.5 MMOL/ML IV SOLN: 9.0000 mL | Freq: Once | INTRAVENOUS | Status: DC | PRN

## 2023-10-22 MED ORDER — GADOPICLENOL 0.5 MMOL/ML IV SOLN
9.0000 mL | Freq: Once | INTRAVENOUS | Status: AC | PRN
  Administered 2023-10-22: 9 mL via INTRAVENOUS

## 2023-11-12 DIAGNOSIS — Z905 Acquired absence of kidney: Secondary | ICD-10-CM | POA: Diagnosis not present

## 2023-11-12 DIAGNOSIS — E1121 Type 2 diabetes mellitus with diabetic nephropathy: Secondary | ICD-10-CM | POA: Diagnosis not present

## 2023-11-12 DIAGNOSIS — I4891 Unspecified atrial fibrillation: Secondary | ICD-10-CM | POA: Diagnosis not present

## 2023-11-12 DIAGNOSIS — I10 Essential (primary) hypertension: Secondary | ICD-10-CM | POA: Diagnosis not present

## 2023-11-12 DIAGNOSIS — E7800 Pure hypercholesterolemia, unspecified: Secondary | ICD-10-CM | POA: Diagnosis not present

## 2023-12-03 DIAGNOSIS — N183 Chronic kidney disease, stage 3 unspecified: Secondary | ICD-10-CM | POA: Diagnosis not present

## 2023-12-20 DIAGNOSIS — N3941 Urge incontinence: Secondary | ICD-10-CM | POA: Diagnosis not present

## 2023-12-20 DIAGNOSIS — Z85528 Personal history of other malignant neoplasm of kidney: Secondary | ICD-10-CM | POA: Diagnosis not present

## 2024-01-21 DIAGNOSIS — M9904 Segmental and somatic dysfunction of sacral region: Secondary | ICD-10-CM | POA: Diagnosis not present

## 2024-01-21 DIAGNOSIS — M9901 Segmental and somatic dysfunction of cervical region: Secondary | ICD-10-CM | POA: Diagnosis not present

## 2024-01-21 DIAGNOSIS — M9902 Segmental and somatic dysfunction of thoracic region: Secondary | ICD-10-CM | POA: Diagnosis not present

## 2024-01-21 DIAGNOSIS — M9903 Segmental and somatic dysfunction of lumbar region: Secondary | ICD-10-CM | POA: Diagnosis not present

## 2024-01-22 DIAGNOSIS — M9903 Segmental and somatic dysfunction of lumbar region: Secondary | ICD-10-CM | POA: Diagnosis not present

## 2024-01-22 DIAGNOSIS — M9901 Segmental and somatic dysfunction of cervical region: Secondary | ICD-10-CM | POA: Diagnosis not present

## 2024-01-22 DIAGNOSIS — M9904 Segmental and somatic dysfunction of sacral region: Secondary | ICD-10-CM | POA: Diagnosis not present

## 2024-01-22 DIAGNOSIS — M9902 Segmental and somatic dysfunction of thoracic region: Secondary | ICD-10-CM | POA: Diagnosis not present

## 2024-01-27 DIAGNOSIS — M9903 Segmental and somatic dysfunction of lumbar region: Secondary | ICD-10-CM | POA: Diagnosis not present

## 2024-01-27 DIAGNOSIS — M9902 Segmental and somatic dysfunction of thoracic region: Secondary | ICD-10-CM | POA: Diagnosis not present

## 2024-01-27 DIAGNOSIS — M9904 Segmental and somatic dysfunction of sacral region: Secondary | ICD-10-CM | POA: Diagnosis not present

## 2024-01-27 DIAGNOSIS — M9901 Segmental and somatic dysfunction of cervical region: Secondary | ICD-10-CM | POA: Diagnosis not present

## 2024-02-07 DIAGNOSIS — E1121 Type 2 diabetes mellitus with diabetic nephropathy: Secondary | ICD-10-CM | POA: Diagnosis not present

## 2024-02-08 LAB — LAB REPORT - SCANNED
A1c: 6.5
EGFR: 32

## 2024-02-10 ENCOUNTER — Encounter: Payer: Self-pay | Admitting: Registered Nurse

## 2024-02-10 DIAGNOSIS — M9901 Segmental and somatic dysfunction of cervical region: Secondary | ICD-10-CM | POA: Diagnosis not present

## 2024-02-10 DIAGNOSIS — M9902 Segmental and somatic dysfunction of thoracic region: Secondary | ICD-10-CM | POA: Diagnosis not present

## 2024-02-10 DIAGNOSIS — M9904 Segmental and somatic dysfunction of sacral region: Secondary | ICD-10-CM | POA: Diagnosis not present

## 2024-02-10 DIAGNOSIS — M9903 Segmental and somatic dysfunction of lumbar region: Secondary | ICD-10-CM | POA: Diagnosis not present

## 2024-02-13 ENCOUNTER — Other Ambulatory Visit (HOSPITAL_COMMUNITY): Payer: Self-pay

## 2024-02-13 ENCOUNTER — Other Ambulatory Visit: Payer: Self-pay

## 2024-02-13 DIAGNOSIS — N183 Chronic kidney disease, stage 3 unspecified: Secondary | ICD-10-CM | POA: Diagnosis not present

## 2024-02-13 DIAGNOSIS — E7800 Pure hypercholesterolemia, unspecified: Secondary | ICD-10-CM | POA: Diagnosis not present

## 2024-02-13 DIAGNOSIS — T466X5A Adverse effect of antihyperlipidemic and antiarteriosclerotic drugs, initial encounter: Secondary | ICD-10-CM | POA: Diagnosis not present

## 2024-02-13 DIAGNOSIS — I10 Essential (primary) hypertension: Secondary | ICD-10-CM | POA: Diagnosis not present

## 2024-02-13 DIAGNOSIS — E1121 Type 2 diabetes mellitus with diabetic nephropathy: Secondary | ICD-10-CM | POA: Diagnosis not present

## 2024-02-13 DIAGNOSIS — I4891 Unspecified atrial fibrillation: Secondary | ICD-10-CM | POA: Diagnosis not present

## 2024-02-13 DIAGNOSIS — G720 Drug-induced myopathy: Secondary | ICD-10-CM | POA: Diagnosis not present

## 2024-02-13 DIAGNOSIS — K219 Gastro-esophageal reflux disease without esophagitis: Secondary | ICD-10-CM | POA: Diagnosis not present

## 2024-02-13 DIAGNOSIS — Z905 Acquired absence of kidney: Secondary | ICD-10-CM | POA: Diagnosis not present

## 2024-02-13 MED ORDER — XARELTO 15 MG PO TABS
15.0000 mg | ORAL_TABLET | Freq: Every day | ORAL | 3 refills | Status: AC
  Filled 2024-02-13: qty 90, 90d supply, fill #0
  Filled 2024-05-08: qty 90, 90d supply, fill #1
  Filled 2024-08-06: qty 90, 90d supply, fill #2
  Filled 2024-11-04: qty 90, 90d supply, fill #3

## 2024-02-13 MED ORDER — PANTOPRAZOLE SODIUM 40 MG PO TBEC
40.0000 mg | DELAYED_RELEASE_TABLET | Freq: Every day | ORAL | 3 refills | Status: AC
  Filled 2024-02-13: qty 90, 90d supply, fill #0
  Filled 2024-05-08: qty 90, 90d supply, fill #1
  Filled 2024-08-06: qty 90, 90d supply, fill #2
  Filled 2024-11-04: qty 90, 90d supply, fill #3

## 2024-02-24 DIAGNOSIS — I129 Hypertensive chronic kidney disease with stage 1 through stage 4 chronic kidney disease, or unspecified chronic kidney disease: Secondary | ICD-10-CM | POA: Diagnosis not present

## 2024-02-24 DIAGNOSIS — K219 Gastro-esophageal reflux disease without esophagitis: Secondary | ICD-10-CM | POA: Diagnosis not present

## 2024-02-24 DIAGNOSIS — E1122 Type 2 diabetes mellitus with diabetic chronic kidney disease: Secondary | ICD-10-CM | POA: Diagnosis not present

## 2024-02-24 DIAGNOSIS — Z85528 Personal history of other malignant neoplasm of kidney: Secondary | ICD-10-CM | POA: Diagnosis not present

## 2024-02-24 DIAGNOSIS — N183 Chronic kidney disease, stage 3 unspecified: Secondary | ICD-10-CM | POA: Diagnosis not present

## 2024-03-02 DIAGNOSIS — M810 Age-related osteoporosis without current pathological fracture: Secondary | ICD-10-CM | POA: Diagnosis not present

## 2024-03-09 DIAGNOSIS — M9901 Segmental and somatic dysfunction of cervical region: Secondary | ICD-10-CM | POA: Diagnosis not present

## 2024-03-09 DIAGNOSIS — M9904 Segmental and somatic dysfunction of sacral region: Secondary | ICD-10-CM | POA: Diagnosis not present

## 2024-03-09 DIAGNOSIS — M9902 Segmental and somatic dysfunction of thoracic region: Secondary | ICD-10-CM | POA: Diagnosis not present

## 2024-03-09 DIAGNOSIS — M9903 Segmental and somatic dysfunction of lumbar region: Secondary | ICD-10-CM | POA: Diagnosis not present

## 2024-03-16 DIAGNOSIS — G4733 Obstructive sleep apnea (adult) (pediatric): Secondary | ICD-10-CM | POA: Diagnosis not present

## 2024-03-17 DIAGNOSIS — M9904 Segmental and somatic dysfunction of sacral region: Secondary | ICD-10-CM | POA: Diagnosis not present

## 2024-03-17 DIAGNOSIS — M9901 Segmental and somatic dysfunction of cervical region: Secondary | ICD-10-CM | POA: Diagnosis not present

## 2024-03-17 DIAGNOSIS — M9903 Segmental and somatic dysfunction of lumbar region: Secondary | ICD-10-CM | POA: Diagnosis not present

## 2024-03-17 DIAGNOSIS — M9902 Segmental and somatic dysfunction of thoracic region: Secondary | ICD-10-CM | POA: Diagnosis not present

## 2024-03-23 ENCOUNTER — Other Ambulatory Visit: Payer: Self-pay

## 2024-03-23 ENCOUNTER — Other Ambulatory Visit (HOSPITAL_COMMUNITY): Payer: Self-pay

## 2024-03-23 MED ORDER — PRAMIPEXOLE DIHYDROCHLORIDE 0.25 MG PO TABS
0.2500 mg | ORAL_TABLET | Freq: Every evening | ORAL | 3 refills | Status: DC
  Filled 2024-03-23: qty 180, 90d supply, fill #0
  Filled 2024-06-05: qty 180, 90d supply, fill #1
  Filled 2024-08-07 – 2024-08-12 (×3): qty 180, 90d supply, fill #2
  Filled 2024-10-15 – 2024-10-20 (×2): qty 180, 90d supply, fill #3

## 2024-03-23 MED ORDER — FAMOTIDINE 20 MG PO TABS
20.0000 mg | ORAL_TABLET | Freq: Two times a day (BID) | ORAL | 3 refills | Status: AC
  Filled 2024-03-23: qty 180, 90d supply, fill #0
  Filled 2024-06-15: qty 180, 90d supply, fill #1
  Filled 2024-09-13: qty 180, 90d supply, fill #2
  Filled 2024-12-12: qty 180, 90d supply, fill #3

## 2024-03-23 MED ORDER — LOSARTAN POTASSIUM 25 MG PO TABS
75.0000 mg | ORAL_TABLET | Freq: Every day | ORAL | 3 refills | Status: AC
  Filled 2024-03-23: qty 270, 90d supply, fill #0
  Filled 2024-06-15: qty 270, 90d supply, fill #1
  Filled 2024-09-13: qty 270, 90d supply, fill #2
  Filled 2024-12-12: qty 270, 90d supply, fill #3

## 2024-03-24 ENCOUNTER — Other Ambulatory Visit (HOSPITAL_COMMUNITY): Payer: Self-pay

## 2024-03-25 DIAGNOSIS — E119 Type 2 diabetes mellitus without complications: Secondary | ICD-10-CM | POA: Diagnosis not present

## 2024-04-30 ENCOUNTER — Other Ambulatory Visit (HOSPITAL_COMMUNITY): Payer: Self-pay

## 2024-04-30 ENCOUNTER — Other Ambulatory Visit: Payer: Self-pay

## 2024-04-30 DIAGNOSIS — E119 Type 2 diabetes mellitus without complications: Secondary | ICD-10-CM | POA: Diagnosis not present

## 2024-04-30 DIAGNOSIS — M791 Myalgia, unspecified site: Secondary | ICD-10-CM | POA: Diagnosis not present

## 2024-04-30 DIAGNOSIS — T466X5A Adverse effect of antihyperlipidemic and antiarteriosclerotic drugs, initial encounter: Secondary | ICD-10-CM | POA: Diagnosis not present

## 2024-04-30 DIAGNOSIS — I10 Essential (primary) hypertension: Secondary | ICD-10-CM | POA: Diagnosis not present

## 2024-04-30 DIAGNOSIS — E7800 Pure hypercholesterolemia, unspecified: Secondary | ICD-10-CM | POA: Diagnosis not present

## 2024-04-30 MED ORDER — REPATHA SURECLICK 140 MG/ML ~~LOC~~ SOAJ
140.0000 mg | SUBCUTANEOUS | 3 refills | Status: AC
  Filled 2024-04-30: qty 6, 84d supply, fill #0
  Filled 2024-06-26 – 2024-07-03 (×2): qty 6, 84d supply, fill #1
  Filled 2024-09-26: qty 6, 84d supply, fill #2
  Filled 2024-12-15 (×3): qty 6, 84d supply, fill #3

## 2024-04-30 MED ORDER — HYDROCHLOROTHIAZIDE 12.5 MG PO TABS
12.5000 mg | ORAL_TABLET | Freq: Every morning | ORAL | 3 refills | Status: AC
  Filled 2024-04-30: qty 90, 90d supply, fill #0
  Filled 2024-07-27: qty 90, 90d supply, fill #1
  Filled 2024-10-24: qty 90, 90d supply, fill #2
  Filled 2025-01-22: qty 90, 90d supply, fill #3

## 2024-05-01 ENCOUNTER — Other Ambulatory Visit: Payer: Self-pay

## 2024-05-01 ENCOUNTER — Other Ambulatory Visit (HOSPITAL_COMMUNITY): Payer: Self-pay

## 2024-05-04 ENCOUNTER — Other Ambulatory Visit (HOSPITAL_COMMUNITY): Payer: Self-pay

## 2024-05-08 ENCOUNTER — Other Ambulatory Visit (HOSPITAL_COMMUNITY): Payer: Self-pay

## 2024-06-02 DIAGNOSIS — M9904 Segmental and somatic dysfunction of sacral region: Secondary | ICD-10-CM | POA: Diagnosis not present

## 2024-06-02 DIAGNOSIS — M9905 Segmental and somatic dysfunction of pelvic region: Secondary | ICD-10-CM | POA: Diagnosis not present

## 2024-06-02 DIAGNOSIS — M9901 Segmental and somatic dysfunction of cervical region: Secondary | ICD-10-CM | POA: Diagnosis not present

## 2024-06-02 DIAGNOSIS — M9903 Segmental and somatic dysfunction of lumbar region: Secondary | ICD-10-CM | POA: Diagnosis not present

## 2024-06-03 DIAGNOSIS — M9901 Segmental and somatic dysfunction of cervical region: Secondary | ICD-10-CM | POA: Diagnosis not present

## 2024-06-03 DIAGNOSIS — M9905 Segmental and somatic dysfunction of pelvic region: Secondary | ICD-10-CM | POA: Diagnosis not present

## 2024-06-03 DIAGNOSIS — M9903 Segmental and somatic dysfunction of lumbar region: Secondary | ICD-10-CM | POA: Diagnosis not present

## 2024-06-03 DIAGNOSIS — M9904 Segmental and somatic dysfunction of sacral region: Secondary | ICD-10-CM | POA: Diagnosis not present

## 2024-06-05 ENCOUNTER — Other Ambulatory Visit (HOSPITAL_COMMUNITY): Payer: Self-pay

## 2024-06-10 DIAGNOSIS — M9905 Segmental and somatic dysfunction of pelvic region: Secondary | ICD-10-CM | POA: Diagnosis not present

## 2024-06-10 DIAGNOSIS — M9904 Segmental and somatic dysfunction of sacral region: Secondary | ICD-10-CM | POA: Diagnosis not present

## 2024-06-10 DIAGNOSIS — M9903 Segmental and somatic dysfunction of lumbar region: Secondary | ICD-10-CM | POA: Diagnosis not present

## 2024-06-10 DIAGNOSIS — M9901 Segmental and somatic dysfunction of cervical region: Secondary | ICD-10-CM | POA: Diagnosis not present

## 2024-06-25 DIAGNOSIS — M9904 Segmental and somatic dysfunction of sacral region: Secondary | ICD-10-CM | POA: Diagnosis not present

## 2024-06-25 DIAGNOSIS — M9901 Segmental and somatic dysfunction of cervical region: Secondary | ICD-10-CM | POA: Diagnosis not present

## 2024-06-25 DIAGNOSIS — M9905 Segmental and somatic dysfunction of pelvic region: Secondary | ICD-10-CM | POA: Diagnosis not present

## 2024-06-25 DIAGNOSIS — M9903 Segmental and somatic dysfunction of lumbar region: Secondary | ICD-10-CM | POA: Diagnosis not present

## 2024-06-26 ENCOUNTER — Other Ambulatory Visit (HOSPITAL_COMMUNITY): Payer: Self-pay

## 2024-07-30 DIAGNOSIS — E559 Vitamin D deficiency, unspecified: Secondary | ICD-10-CM | POA: Diagnosis not present

## 2024-07-30 DIAGNOSIS — Z0000 Encounter for general adult medical examination without abnormal findings: Secondary | ICD-10-CM | POA: Diagnosis not present

## 2024-07-30 DIAGNOSIS — E1121 Type 2 diabetes mellitus with diabetic nephropathy: Secondary | ICD-10-CM | POA: Diagnosis not present

## 2024-07-30 DIAGNOSIS — I10 Essential (primary) hypertension: Secondary | ICD-10-CM | POA: Diagnosis not present

## 2024-07-30 DIAGNOSIS — E7800 Pure hypercholesterolemia, unspecified: Secondary | ICD-10-CM | POA: Diagnosis not present

## 2024-07-30 LAB — LAB REPORT - SCANNED
A1c: 6.5
EGFR: 35

## 2024-08-06 ENCOUNTER — Encounter: Payer: Self-pay | Admitting: Registered Nurse

## 2024-08-06 ENCOUNTER — Other Ambulatory Visit: Payer: Self-pay

## 2024-08-06 DIAGNOSIS — Z0000 Encounter for general adult medical examination without abnormal findings: Secondary | ICD-10-CM | POA: Diagnosis not present

## 2024-08-06 DIAGNOSIS — I10 Essential (primary) hypertension: Secondary | ICD-10-CM | POA: Diagnosis not present

## 2024-08-06 DIAGNOSIS — E1121 Type 2 diabetes mellitus with diabetic nephropathy: Secondary | ICD-10-CM | POA: Diagnosis not present

## 2024-08-06 DIAGNOSIS — M810 Age-related osteoporosis without current pathological fracture: Secondary | ICD-10-CM | POA: Diagnosis not present

## 2024-08-06 DIAGNOSIS — N183 Chronic kidney disease, stage 3 unspecified: Secondary | ICD-10-CM | POA: Diagnosis not present

## 2024-08-06 DIAGNOSIS — E559 Vitamin D deficiency, unspecified: Secondary | ICD-10-CM | POA: Diagnosis not present

## 2024-08-06 DIAGNOSIS — I4891 Unspecified atrial fibrillation: Secondary | ICD-10-CM | POA: Diagnosis not present

## 2024-08-06 DIAGNOSIS — Z905 Acquired absence of kidney: Secondary | ICD-10-CM | POA: Diagnosis not present

## 2024-08-06 DIAGNOSIS — Z01419 Encounter for gynecological examination (general) (routine) without abnormal findings: Secondary | ICD-10-CM | POA: Diagnosis not present

## 2024-08-07 ENCOUNTER — Ambulatory Visit: Payer: Self-pay | Admitting: Physician Assistant

## 2024-08-07 DIAGNOSIS — Z1231 Encounter for screening mammogram for malignant neoplasm of breast: Secondary | ICD-10-CM | POA: Diagnosis not present

## 2024-08-08 ENCOUNTER — Other Ambulatory Visit (HOSPITAL_COMMUNITY): Payer: Self-pay

## 2024-08-11 ENCOUNTER — Other Ambulatory Visit (HOSPITAL_COMMUNITY): Payer: Self-pay

## 2024-08-12 ENCOUNTER — Other Ambulatory Visit: Payer: Self-pay

## 2024-08-12 DIAGNOSIS — G4733 Obstructive sleep apnea (adult) (pediatric): Secondary | ICD-10-CM | POA: Diagnosis not present

## 2024-08-21 DIAGNOSIS — N6314 Unspecified lump in the right breast, lower inner quadrant: Secondary | ICD-10-CM | POA: Diagnosis not present

## 2024-08-21 DIAGNOSIS — R921 Mammographic calcification found on diagnostic imaging of breast: Secondary | ICD-10-CM | POA: Diagnosis not present

## 2024-09-03 DIAGNOSIS — M810 Age-related osteoporosis without current pathological fracture: Secondary | ICD-10-CM | POA: Diagnosis not present

## 2024-09-11 ENCOUNTER — Other Ambulatory Visit: Payer: Self-pay

## 2024-09-11 DIAGNOSIS — D0511 Intraductal carcinoma in situ of right breast: Secondary | ICD-10-CM | POA: Diagnosis not present

## 2024-09-11 DIAGNOSIS — R921 Mammographic calcification found on diagnostic imaging of breast: Secondary | ICD-10-CM | POA: Diagnosis not present

## 2024-09-15 LAB — SURGICAL PATHOLOGY

## 2024-09-16 ENCOUNTER — Telehealth: Payer: Self-pay | Admitting: *Deleted

## 2024-09-16 DIAGNOSIS — C50919 Malignant neoplasm of unspecified site of unspecified female breast: Secondary | ICD-10-CM

## 2024-09-16 HISTORY — DX: Malignant neoplasm of unspecified site of unspecified female breast: C50.919

## 2024-09-16 NOTE — Telephone Encounter (Signed)
 Patient coming 10/15 not 10/8

## 2024-09-16 NOTE — Telephone Encounter (Signed)
 Spoke to patient to confirm upcoming afternoon [facility] clinic appointment on 10/8, paperwork will be sent via [facility] location and time, also informed patient that the surgeon's office would be calling as well to get information from them similar to the packet that they will be receiving so make sure to do both.  Reminded patient that all providers will be coming to the clinic to see them HERE and if they had any questions to not hesitate to reach back out to myself or their navigators.

## 2024-09-25 ENCOUNTER — Encounter: Payer: Self-pay | Admitting: Internal Medicine

## 2024-09-26 ENCOUNTER — Other Ambulatory Visit (HOSPITAL_COMMUNITY): Payer: Self-pay

## 2024-09-28 ENCOUNTER — Encounter: Payer: Self-pay | Admitting: *Deleted

## 2024-09-28 DIAGNOSIS — D0511 Intraductal carcinoma in situ of right breast: Secondary | ICD-10-CM | POA: Insufficient documentation

## 2024-09-29 ENCOUNTER — Telehealth: Payer: Self-pay

## 2024-09-29 NOTE — Telephone Encounter (Signed)
 Spoke with patient and confirmed appointment on 10/15 with breast clinic... also advised patient to bring paperwork that was emailed or sent.

## 2024-09-29 NOTE — Progress Notes (Signed)
 Radiation Oncology         [phone] ________________________________  Multidisciplinary Breast Oncology Clinic [facility]) Initial Outpatient Consultation  Name: Jacqueline [name] MRN: [id number]  Date: 09/30/2024  DOB: 04-16-1951  RR:Eyjmm, Ryan, MD  Ebbie Cough, MD   REFERRING PHYSICIAN: Ebbie Cough, MD  DIAGNOSIS: There were no encounter diagnoses.  Stage 0 (cTis (DCIS), cN0, cM0) Right Breast LIQ, Intermediate to high grade DCIS with a small focus of microinvasion present, ER+ / PR+ / Her2 not assessed  No diagnosis found.  HISTORY OF PRESENT ILLNESS::Jacqueline [name] is a 73 y.o. female who is presenting to the office today for evaluation of her newly diagnosed breast cancer. She is accompanied by ***. She is doing well overall.   She had routine screening mammography on 08/07/24 showing a possible abnormality in the right breast. She underwent a right breast diagnostic mammogram at [facility] on 08/21/24 which revealed a suspicious focal asymmetry in the lower inner right breast with calcifications. A right breast ultrasound was the performed (on that same date) which demonstrated a 0.6 cm irregular mass in the 4 o'clock right breast (located 4 cmfn), corresponding to mammographic findings.   Biopsy of the lower inner right breast mass on 09/11/24 showed: intermediate to high grade DCIS measuring 6 mm in the greatest linear extent of the sample with a small focus of microinvasion present (measuring less than 1 mm), along with necrosis and calcifications present. Prognostic indicators significant for: estrogen receptor, 100% positive with strong staining intensity and progesterone receptor, 60% positive with moderate to strong staining intensity; Her2 not assessed.   Menarche: *** years old Age at first live birth: *** years old GP: *** LMP: *** Contraceptive: *** HRT: ***   The patient was referred today for presentation in the multidisciplinary conference.  Radiology  studies and pathology slides were presented there for review and discussion of treatment options.  A consensus was discussed regarding potential next steps.  PREVIOUS RADIATION THERAPY: No  PAST MEDICAL HISTORY:  Past Medical History:  Diagnosis Date   Arthritis    Spine and Bil hands   Compression fracture of L1 lumbar vertebra (HCC) 01/30/2018   CXR   Fatty liver    Gait abnormality    GERD (gastroesophageal reflux disease)    History of hiatal hernia    HTN (hypertension)    Hyperlipidemia    OSA on CPAP    Paroxysmal atrial fibrillation (HCC)    Plantar fasciitis, left    Pneumonia    History of    PONV (postoperative nausea and vomiting)    Pre-diabetes    Skin rash    under bil breast   Stress incontinence, female    Typical atrial flutter (HCC)     PAST SURGICAL HISTORY: Past Surgical History:  Procedure Laterality Date   ANTERIOR AND POSTERIOR VAGINAL REPAIR     BLADDER SURGERY     BURCH PROCEDURE     COLONOSCOPY     ELECTROPHYSIOLOGIC STUDY N/A 12/22/2015   CTI and PVI ablation by Dr Kelsie   ELECTROPHYSIOLOGIC STUDY N/A 10/02/2016   Procedure: Atrial Fibrillation Ablation;  Surgeon: Lynwood Kelsie, MD;  Location[facility] INVASIVE CV LAB;  Service: Cardiovascular;  Laterality: N/A;   EP IMPLANTABLE DEVICE N/A 08/11/2015   Procedure: Loop Recorder Insertion;  Surgeon: Lynwood Kelsie, MD;  Location: [facility] INVASIVE CV LAB;  Service: Cardiovascular;  Laterality: N/A;   implantable loop recorder removal  03/25/2020   MDT Reveal LINQ removed in office by Dr  Allred   ROBOT ASSISTED LAPAROSCOPIC NEPHRECTOMY Left 03/07/2018   Procedure: XI ROBOTIC ASSISTED LAPAROSCOPIC NEPHRECTOMY;  Surgeon: Sherrilee Belvie CROME, MD;  Location: [facility] ORS;  Service: Urology;  Laterality: Left;   VAGINAL HYSTERECTOMY      FAMILY HISTORY:  Family History  Problem Relation Age of Onset   Breast cancer Mother    Colon cancer Mother    Heart block Father        probable   Bradycardia Father     Diabetes Paternal Grandmother    Diabetes Maternal Grandmother    Heart disease Paternal Uncle     SOCIAL HISTORY:  Social History   Socioeconomic History   Marital status: Widowed    Spouse name: Not on file   Number of children: 2   Years of education: 40   Highest education level: Not on file  Occupational History   Occupation: Full time Librarian, academic  Tobacco Use   Smoking status: Never   Smokeless tobacco: Never  Vaping Use   Vaping status: Never Used  Substance and Sexual Activity   Alcohol use: Yes    Alcohol/week: 0.0 standard drinks of alcohol    Comment: Occasional   Drug use: No   Sexual activity: Not on file  Other Topics Concern   Not on file  Social History Narrative   Lives at home with husband.   2 biological children, 2 step-children.   Right-handed.   2-4 cups caffeine per day.   Social Drivers of Corporate investment banker Strain: Not on file  Food Insecurity: No Food Insecurity (09/28/2024)   Hunger Vital Sign    Worried About Running Out of Food in the Last Year: Never true    Ran Out of Food in the Last Year: Never true  Transportation Needs: No Transportation Needs (09/28/2024)   PRAPARE - Administrator, Civil Service (Medical): No    Lack of Transportation (Non-Medical): No  Physical Activity: Not on file  Stress: Not on file  Social Connections: Not on file    ALLERGIES:  Allergies  Allergen Reactions   Lisinopril      Cough    MEDICATIONS:  Current Outpatient Medications  Medication Sig Dispense Refill   acetaminophen  (TYLENOL ) 500 MG tablet Take 500 mg by mouth daily as needed for mild pain or moderate pain.      cetirizine  (ZYRTEC ) 10 MG tablet Take 10 mg by mouth daily.       denosumab  (PROLIA ) 60 MG/ML SOSY injection Inject 60 mg into the skin every 6 (six) months.     Evolocumab  (REPATHA  SURECLICK) 140 MG/ML SOAJ Inject 140 mg into the skin every 14 (fourteen) days. 6 mL 3   famotidine  (PEPCID ) 20 MG tablet  Take 20 mg by mouth 2 (two) times daily.     famotidine  (PEPCID ) 20 MG tablet Take 1 tablet (20 mg total) by mouth 2 (two) times daily. 180 tablet 3   fluticasone (FLONASE) 50 MCG/ACT nasal spray Place 1 spray into both nostrils daily as needed for allergies or rhinitis.     hydrochlorothiazide  (HYDRODIURIL ) 12.5 MG tablet Take 12.5 mg by mouth daily.     hydrochlorothiazide  (HYDRODIURIL ) 12.5 MG tablet Take 1 tablet (12.5 mg total) by mouth in the morning. 90 tablet 3   losartan  (COZAAR ) 25 MG tablet Take 75 mg by mouth daily.     losartan  (COZAAR ) 25 MG tablet Take 3 tablets (75 mg total) by mouth daily. 270 tablet 3  pantoprazole  (PROTONIX ) 40 MG tablet Take 40 mg by mouth daily.     pantoprazole  (PROTONIX ) 40 MG tablet Take 1 tablet (40 mg total) by mouth daily. 90 tablet 3   pramipexole  (MIRAPEX ) 0.125 MG tablet Take 0.25 mg by mouth every evening. 3 hours before bedtime  2   pramipexole  (MIRAPEX ) 0.25 MG tablet Take 1-2 tablets (0.25-0.5 mg total) by mouth once a day 3HRS before bedtime. 180 tablet 3   Probiotic Product (PROBIOTIC PO) Take 1 capsule by mouth daily.     Rivaroxaban  (XARELTO ) 15 MG TABS tablet TAKE 1 TABLET DAILY WITH   SUPPER 90 tablet 0   Rivaroxaban  (XARELTO ) 15 MG TABS tablet Take 1 tablet (15 mg total) by mouth daily. 90 tablet 3   rosuvastatin (CRESTOR) 10 MG tablet Take 10 mg by mouth at bedtime.     TRULICITY 0.75 MG/0.5ML SOPN SMARTSIG:0.75 Milligram(s) SUB-Q Every 4 Weeks     No current facility-administered medications for this encounter.    REVIEW OF SYSTEMS: A 10+ POINT REVIEW OF SYSTEMS WAS OBTAINED including neurology, dermatology, psychiatry, cardiac, respiratory, lymph, extremities, GI, GU, musculoskeletal, constitutional, reproductive, HEENT. On the provided form, she reports ***. She denies *** and any other symptoms.    PHYSICAL EXAM:  vitals were not taken for this visit.  {may need to copy over vitals} Lungs are clear to auscultation bilaterally.  Heart has regular rate and rhythm. No palpable cervical, supraclavicular, or axillary adenopathy. Abdomen soft, non-tender, normal bowel sounds. Breast: Left breast with no palpable mass, nipple discharge, or bleeding. Right breast with ***.   KPS = ***  100 - Normal; no complaints; no evidence of disease. 90   - Able to carry on normal activity; minor signs or symptoms of disease. 80   - Normal activity with effort; some signs or symptoms of disease. 76   - Cares for self; unable to carry on normal activity or to do active work. 60   - Requires occasional assistance, but is able to care for most of his personal needs. 50   - Requires considerable assistance and frequent medical care. 40   - Disabled; requires special care and assistance. 30   - Severely disabled; hospital admission is indicated although death not imminent. 20   - Very sick; hospital admission necessary; active supportive treatment necessary. 10   - Moribund; fatal processes progressing rapidly. 0     - Dead  Karnofsky DA, Abelmann WH, Craver LS and Burchenal [facility] [phone]) The use of the nitrogen mustards in the palliative treatment of carcinoma: with particular reference to bronchogenic carcinoma Cancer 1 634-56  LABORATORY DATA:  Lab Results  Component Value Date   WBC 7.1 03/03/2018   HGB 12.4 03/09/2018   HCT 38.1 03/09/2018   MCV 92.3 03/03/2018   PLT 212 03/03/2018   Lab Results  Component Value Date   NA 134 (L) 03/09/2018   K 3.6 03/09/2018   CL 101 03/09/2018   CO2 25 03/09/2018   No results found for: ALT, AST, GGT, ALKPHOS, BILITOT  PULMONARY FUNCTION TEST:   Review Flowsheet        No data to display          RADIOGRAPHY: No results found.    IMPRESSION: Stage 0 (cTis (DCIS), cN0, cM0) Right Breast LIQ, Intermediate to high grade DCIS with a small focus of microinvasion present, ER+ / PR+ / Her2 not assessed   Patient will be a good candidate for breast conservation with  radiotherapy to the right breast. We discussed the general course of radiation, potential side effects, and toxicities with radiation and the patient is interested in this approach. ***   PLAN:  *** (copy notes from board at conference)   ------------------------------------------------  Lynwood CHARM Nasuti, PhD, MD  This document serves as a record of services personally performed by Lynwood Nasuti, MD. It was created on his behalf by Dorthy Fuse, a trained medical scribe. The creation of this record is based on the scribe's personal observations and the provider's statements to them. This document has been checked and approved by the attending provider.

## 2024-09-30 ENCOUNTER — Ambulatory Visit: Admitting: Physical Therapy

## 2024-09-30 ENCOUNTER — Encounter: Payer: Self-pay | Admitting: *Deleted

## 2024-09-30 ENCOUNTER — Inpatient Hospital Stay (HOSPITAL_BASED_OUTPATIENT_CLINIC_OR_DEPARTMENT_OTHER): Admitting: Hematology and Oncology

## 2024-09-30 ENCOUNTER — Ambulatory Visit
Admission: RE | Admit: 2024-09-30 | Discharge: 2024-09-30 | Disposition: A | Discharge status: Home / Self Care | Payer: Self-pay | Source: Ambulatory Visit | Attending: Radiation Oncology | Admitting: Radiation Oncology

## 2024-09-30 ENCOUNTER — Inpatient Hospital Stay (HOSPITAL_BASED_OUTPATIENT_CLINIC_OR_DEPARTMENT_OTHER)

## 2024-09-30 ENCOUNTER — Inpatient Hospital Stay: Attending: Hematology and Oncology

## 2024-09-30 VITALS — BP 156/54 | HR 56 | Temp 97.8°F | Resp 16 | Ht 66.93 in | Wt 202.6 lb

## 2024-09-30 DIAGNOSIS — Z85528 Personal history of other malignant neoplasm of kidney: Secondary | ICD-10-CM | POA: Diagnosis not present

## 2024-09-30 DIAGNOSIS — Z808 Family history of malignant neoplasm of other organs or systems: Secondary | ICD-10-CM

## 2024-09-30 DIAGNOSIS — Z803 Family history of malignant neoplasm of breast: Secondary | ICD-10-CM

## 2024-09-30 DIAGNOSIS — Z170 Estrogen receptor positive status [ER+]: Secondary | ICD-10-CM | POA: Diagnosis not present

## 2024-09-30 DIAGNOSIS — D0511 Intraductal carcinoma in situ of right breast: Secondary | ICD-10-CM

## 2024-09-30 DIAGNOSIS — M810 Age-related osteoporosis without current pathological fracture: Secondary | ICD-10-CM | POA: Diagnosis not present

## 2024-09-30 DIAGNOSIS — Z800 Family history of malignant neoplasm of digestive organs: Secondary | ICD-10-CM | POA: Insufficient documentation

## 2024-09-30 DIAGNOSIS — C642 Malignant neoplasm of left kidney, except renal pelvis: Secondary | ICD-10-CM | POA: Diagnosis not present

## 2024-09-30 LAB — CMP (CANCER CENTER ONLY)
ALT: 26 U/L (ref 0–44)
AST: 24 U/L (ref 15–41)
Albumin: 3.9 g/dL (ref 3.5–5.0)
Alkaline Phosphatase: 53 U/L (ref 38–126)
Anion gap: 6 (ref 5–15)
BUN: 22 mg/dL (ref 8–23)
CO2: 27 mmol/L (ref 22–32)
Calcium: 9.3 mg/dL (ref 8.9–10.3)
Chloride: 106 mmol/L (ref 98–111)
Creatinine: 1.53 mg/dL — ABNORMAL HIGH (ref 0.44–1.00)
GFR, Estimated: 35 mL/min — ABNORMAL LOW (ref >60)
Glucose, Bld: 110 mg/dL — ABNORMAL HIGH (ref 70–99)
Potassium: 3.9 mmol/L (ref 3.5–5.1)
Sodium: 139 mmol/L (ref 135–145)
Total Bilirubin: 0.5 mg/dL (ref 0.0–1.2)
Total Protein: 6.8 g/dL (ref 6.5–8.1)

## 2024-09-30 LAB — CBC WITH DIFFERENTIAL (CANCER CENTER ONLY)
Abs Immature Granulocytes: 0.04 K/uL (ref 0.00–0.07)
Basophils Absolute: 0 K/uL (ref 0.0–0.1)
Basophils Relative: 0 %
Eosinophils Absolute: 0.2 K/uL (ref 0.0–0.5)
Eosinophils Relative: 2 %
HCT: 38.2 % (ref 36.0–46.0)
Hemoglobin: 12.9 g/dL (ref 12.0–15.0)
Immature Granulocytes: 1 %
Lymphocytes Relative: 32 %
Lymphs Abs: 2.8 K/uL (ref 0.7–4.0)
MCH: 30 pg (ref 26.0–34.0)
MCHC: 33.8 g/dL (ref 30.0–36.0)
MCV: 88.8 fL (ref 80.0–100.0)
Monocytes Absolute: 0.9 K/uL (ref 0.1–1.0)
Monocytes Relative: 10 %
Neutro Abs: 4.9 K/uL (ref 1.7–7.7)
Neutrophils Relative %: 55 %
Platelet Count: 233 K/uL (ref 150–400)
RBC: 4.3 MIL/uL (ref 3.87–5.11)
RDW: 13.1 % (ref 11.5–15.5)
WBC Count: 8.8 K/uL (ref 4.0–10.5)
nRBC: 0 % (ref 0.0–0.2)

## 2024-09-30 LAB — GENETIC SCREENING ORDER

## 2024-09-30 NOTE — Assessment & Plan Note (Signed)
 09/11/2024:Screening mammogram detected right breast calcifications and focal asymmetry which measured 0.6 Aranesp ultrasound 4 o'clock position, axilla negative, stereotactic biopsy: Intermediate to high-grade DCIS with small focus of microinvasion ER 100%, PR 60%  Pathology review: I discussed with the patient the difference between DCIS and invasive breast cancer. It is considered a precancerous lesion. DCIS is classified as a 0. It is generally detected through mammograms as calcifications. We discussed the significance of grades and its impact on prognosis. We also discussed the importance of ER and PR receptors and their implications to adjuvant treatment options. Prognosis of DCIS dependence on grade, comedo necrosis. It is anticipated that if not treated, 20-30% of DCIS can develop into invasive breast cancer.  Recommendation: 1. Breast conserving surgery 2. Followed by adjuvant radiation therapy 3. Followed by antiestrogen therapy with tamoxifen 5 years  Tamoxifen counseling: We discussed the risks and benefits of tamoxifen. These include but not limited to insomnia, hot flashes, mood changes, vaginal dryness, and weight gain. Although rare, serious side effects including endometrial cancer, risk of blood clots were also discussed. We strongly believe that the benefits far outweigh the risks. Patient understands these risks and consented to starting treatment. Planned treatment duration is 5 years.  Even if she has invasive breast cancer we recommend tamoxifen because of her history of osteoporosis.  She takes Prolia .  Return to clinic after surgery to discuss the final pathology report and come up with an adjuvant treatment plan.

## 2024-09-30 NOTE — Progress Notes (Signed)
 [facility] CONSULT NOTE  Patient Care Team: [name], MD as PCP - General (Internal Medicine) Kelsie Agent, MD (Inactive) as PCP - Cardiology (Cardiology) Gerome, Devere HERO, RN as Oncology Nurse Navigator Tyree Nanetta SAILOR, RN as Oncology Nurse Navigator Ebbie Cough, MD as Consulting Physician (General Surgery) Odean Potts, MD as Consulting Physician (Hematology and Oncology) Shannon Agent, MD as Consulting Physician (Radiation Oncology)  CHIEF COMPLAINTS/PURPOSE OF CONSULTATION:  Newly diagnosed breast cancer  HISTORY OF PRESENTING ILLNESS:    History of Present Illness Jacqueline [name] is a 73 year old female with ductal carcinoma in situ (DCIS) who presents for oncology consultation following a routine mammogram. She is accompanied by her best friend, Ander, and her daughter, who is on the phone.  A routine mammogram revealed a disturbance in the breast with calcifications, leading to further evaluation. An ultrasound and biopsy confirmed ductal carcinoma in situ (DCIS) with a small focus of microinvasion. The lesion measures 0.6 cm with no lymph node involvement. Hormone receptor testing shows 100% estrogen receptor positivity and 60% progesterone receptor positivity. The DCIS is graded between intermediate to high, specifically grades 2 and 3.  She has a significant family history of breast cancer, with her mother and maternal aunt affected by the disease.  Her medical history includes osteoporosis, currently managed with Prolia , and a history of hysterectomy.     I reviewed her records extensively and collaborated the history with the patient.  SUMMARY OF ONCOLOGIC HISTORY: Oncology History  Renal mass  03/07/2018 Initial Diagnosis   Renal mass   Ductal carcinoma in situ (DCIS) of right breast  09/11/2024 Initial Diagnosis   Screening mammogram detected right breast calcifications and focal asymmetry which measured 0.6 Aranesp ultrasound 4 o'clock  position, axilla negative, stereotactic biopsy: Intermediate to high-grade DCIS with small focus of microinvasion ER 100%, PR 60%      MEDICAL HISTORY:  Past Medical History:  Diagnosis Date   Arthritis    Spine and Bil hands   Cancer (HCC) Renal Cell Carcinoma   2019   Chronic kidney disease 2019   Compression fracture of L1 lumbar vertebra (HCC) 01/30/2018   CXR   Fatty liver    Gait abnormality    GERD (gastroesophageal reflux disease)    History of hiatal hernia    HTN (hypertension)    Hyperlipidemia    OSA on CPAP    Paroxysmal atrial fibrillation (HCC)    Plantar fasciitis, left    Pneumonia    History of    PONV (postoperative nausea and vomiting)    Pre-diabetes    Skin rash    under bil breast   Sleep apnea    Stress incontinence, female    Typical atrial flutter (HCC)     SURGICAL HISTORY: Past Surgical History:  Procedure Laterality Date   ANTERIOR AND POSTERIOR VAGINAL REPAIR     BLADDER SURGERY     BURCH PROCEDURE     COLONOSCOPY     ELECTROPHYSIOLOGIC STUDY N/A 12/22/2015   CTI and PVI ablation by Dr Kelsie   ELECTROPHYSIOLOGIC STUDY N/A 10/02/2016   Procedure: Atrial Fibrillation Ablation;  Surgeon: Agent Kelsie, MD;  Location[facility] INVAS[facility] CV LAB;  Service: Cardiovascular;  Laterality: N/A;   EP IMPLANTABLE DEVICE N/A 08/11/2015   Procedure: Loop Recorder Insertion;  Surgeon: Agent Kelsie, MD;  Location: [facility] INVASIVE CV LAB;  Service: Cardiovascular;  Laterality: N/A;   implantable loop recorder removal  03/25/2020   MDT Reveal LINQ removed in  office by Dr Kelsie   ROBOT ASSISTED LAPAROSCOPIC NEPHRECTOMY Left 03/07/2018   Procedure: XI ROBOTIC ASSISTED LAPAROSCOPIC NEPHRECTOMY;  Surgeon: Sherrilee Belvie CROME, MD;  Location: [facility] ORS;  Service: Urology;  Laterality: Left;   VAGINAL HYSTERECTOMY      SOCIAL HISTORY: Social History   Socioeconomic History   Marital status: Widowed    Spouse name: Not on file   Number of children: 2   Years of  education: 47   Highest education level: Not on file  Occupational History   Occupation: Full time Librarian, academic  Tobacco Use   Smoking status: Never   Smokeless tobacco: Never  Vaping Use   Vaping status: Never Used  Substance and Sexual Activity   Alcohol use: Yes    Alcohol/week: 0.0 standard drinks of alcohol    Comment: Occasional   Drug use: No   Sexual activity: Not on file  Other Topics Concern   Not on file  Social History Narrative   Lives at home with husband.   2 biological children, 2 step-children.   Right-handed.   2-4 cups caffeine per day.   Social Drivers of Corporate investment banker Strain: Not on file  Food Insecurity: No Food Insecurity (09/30/2024)   Hunger Vital Sign    Worried About Running Out of Food in the Last Year: Never true    Ran Out of Food in the Last Year: Never true  Transportation Needs: No Transportation Needs (09/30/2024)   PRAPARE - Administrator, Civil Service (Medical): No    Lack of Transportation (Non-Medical): No  Physical Activity: Not on file  Stress: Not on file  Social Connections: Not on file  Intimate Partner Violence: Not on file    FAMILY HISTORY: Family History  Problem Relation Age of Onset   Breast cancer Mother 56   Colon cancer Mother 66   Cancer Mother    Heart block Father        probable   Bradycardia Father    Diabetes Maternal Grandmother    Diabetes Paternal Grandmother    Breast cancer Maternal Aunt    Heart disease Paternal Uncle     ALLERGIES:  is allergic to lisinopril .  MEDICATIONS:  Current Outpatient Medications  Medication Sig Dispense Refill   acetaminophen  (TYLENOL ) 500 MG tablet Take 500 mg by mouth daily as needed for mild pain or moderate pain.      cetirizine  (ZYRTEC ) 10 MG tablet Take 10 mg by mouth daily.       denosumab  (PROLIA ) 60 MG/ML SOSY injection Inject 60 mg into the skin every 6 (six) months.     famotidine  (PEPCID ) 20 MG tablet Take 1 tablet (20 mg  total) by mouth 2 (two) times daily. 180 tablet 3   hydrochlorothiazide  (HYDRODIURIL ) 12.5 MG tablet Take 1 tablet (12.5 mg total) by mouth in the morning. 90 tablet 3   losartan  (COZAAR ) 25 MG tablet Take 3 tablets (75 mg total) by mouth daily. 270 tablet 3   pantoprazole  (PROTONIX ) 40 MG tablet Take 40 mg by mouth daily.     pantoprazole  (PROTONIX ) 40 MG tablet Take 1 tablet (40 mg total) by mouth daily. 90 tablet 3   pramipexole  (MIRAPEX ) 0.125 MG tablet Take 0.25 mg by mouth every evening. 3 hours before bedtime  2   pramipexole  (MIRAPEX ) 0.25 MG tablet Take 1-2 tablets (0.25-0.5 mg total) by mouth once a day 3HRS before bedtime. 180 tablet 3   Probiotic Product (PROBIOTIC PO)  Take 1 capsule by mouth daily.     TRULICITY 0.75 MG/0.5ML SOPN SMARTSIG:0.75 Milligram(s) SUB-Q Every 4 Weeks     Evolocumab  (REPATHA  SURECLICK) 140 MG/ML SOAJ Inject 140 mg into the skin every 14 (fourteen) days. (Patient not taking: Reported on 09/30/2024) 6 mL 3   famotidine  (PEPCID ) 20 MG tablet Take 20 mg by mouth 2 (two) times daily. (Patient not taking: Reported on 09/30/2024)     fluticasone (FLONASE) 50 MCG/ACT nasal spray Place 1 spray into both nostrils daily as needed for allergies or rhinitis. (Patient not taking: Reported on 09/30/2024)     Rivaroxaban  (XARELTO ) 15 MG TABS tablet Take 1 tablet (15 mg total) by mouth daily. 90 tablet 3   No current facility-administered medications for this visit.    REVIEW OF SYSTEMS:   Constitutional: Denies fevers, chills or abnormal night sweats   All other systems were reviewed with the patient and are negative.  PHYSICAL EXAMINATION: ECOG PERFORMANCE STATUS: 2 - Symptomatic, <50% confined to bed  Vitals:   09/30/24 1242  BP: (!) 156/54  Pulse: (!) 56  Resp: 16  Temp: 97.8 F (36.6 C)  SpO2: 99%   Filed Weights   09/30/24 1242  Weight: 202 lb 9.6 oz (91.9 kg)    GENERAL:alert, no distress and comfortable    LABORATORY DATA:  I have reviewed the  data as listed Lab Results  Component Value Date   WBC 8.8 09/30/2024   HGB 12.9 09/30/2024   HCT 38.2 09/30/2024   MCV 88.8 09/30/2024   PLT 233 09/30/2024   Lab Results  Component Value Date   NA 139 09/30/2024   K 3.9 09/30/2024   CL 106 09/30/2024   CO2 27 09/30/2024    RADIOGRAPHIC STUDIES: I have personally reviewed the radiological reports and agreed with the findings in the report.  ASSESSMENT AND PLAN:  Ductal carcinoma in situ (DCIS) of right breast 09/11/2024:Screening mammogram detected right breast calcifications and focal asymmetry which measured 0.6 Aranesp ultrasound 4 o'clock position, axilla negative, stereotactic biopsy: Intermediate to high-grade DCIS with small focus of microinvasion ER 100%, PR 60%  Pathology review: I discussed with the patient the difference between DCIS and invasive breast cancer. It is considered a precancerous lesion. DCIS is classified as a 0. It is generally detected through mammograms as calcifications. We discussed the significance of grades and its impact on prognosis. We also discussed the importance of ER and PR receptors and their implications to adjuvant treatment options. Prognosis of DCIS dependence on grade, comedo necrosis. It is anticipated that if not treated, 20-30% of DCIS can develop into invasive breast cancer.  Recommendation: 1. Breast conserving surgery 2. Followed by adjuvant radiation therapy 3. Followed by antiestrogen therapy with tamoxifen 5 years  Tamoxifen counseling: We discussed the risks and benefits of tamoxifen. These include but not limited to insomnia, hot flashes, mood changes, vaginal dryness, and weight gain. Although rare, serious side effects including endometrial cancer, risk of blood clots were also discussed. We strongly believe that the benefits far outweigh the risks. Patient understands these risks and consented to starting treatment. Planned treatment duration is 5 years.  Even if she has  invasive breast cancer we recommend tamoxifen because of her history of osteoporosis.  She takes Prolia .  Return to clinic after surgery to discuss the final pathology report and come up with an adjuvant treatment plan.      All questions were answered. The patient knows to call the clinic with any problems,  questions or concerns. I personally spent a total of 30 minutes in the care of the patient today including preparing to see the patient, getting/reviewing separately obtained history, performing a medically appropriate exam/evaluation, counseling and educating, placing orders, referring and communicating with other health care professionals, documenting clinical information in the EHR, independently interpreting results, communicating results, and coordinating care.   Viinay K Sheryll Dymek, MD 09/30/24

## 2024-09-30 NOTE — Progress Notes (Unsigned)
 REFERRING PROVIDER: Ebbie Cough, MD [address] [place],  [place] [address]  PRIMARY PROVIDER:  [name], MD  PRIMARY REASON FOR VISIT:  No diagnosis found.  HISTORY OF PRESENT ILLNESS:   Ms. Ollis, a 73 y.o. female, was seen for a [facility] cancer genetics consultation at the request of Cough Ebbie, MD due to a personal history of breast cancer. Ms. Remley presents today the at the [facility] to discuss the possibility of a hereditary predisposition to cancer, genetic testing, and to further clarify her future cancer risks, as well as potential cancer risks for family members.  Diagnosis: In September 2025, at the age of 52, Ms. Kalama was diagnosed with intermediate to high grade DCIS with a small focus of microinvasion present, ER+ / PR+ HER2 not assessed of the right breast. She has a history of left renal cancer diagnosed at age 33 followed by left nephrectomy. Her current treatment plan includes breast conserving surgery, adjuvant radiation therapy and antiestrogen therapy with tamoxifen 5 years.  CANCER HISTORY:  Oncology History  Renal mass  03/07/2018 Initial Diagnosis   Renal mass   Ductal carcinoma in situ (DCIS) of right breast  09/28/2024 Initial Diagnosis   Ductal carcinoma in situ (DCIS) of right breast    RISK FACTORS:  Menarche was at age 59.  First live birth at age 76.  OCP use for approximately 11 years.  Ovaries intact: yes.  Hysterectomy: yes (1986) Menopausal status: postmenopausal.  HRT use: 0 years. Colonoscopy: yes; 10/13/14, 09/2019, 12/2019. Mammogram within the last year: yes. Number of breast biopsies: 1. Tobacco Use: Never  Past Medical History:  Diagnosis Date   Arthritis    Spine and Bil hands   Compression fracture of L1 lumbar vertebra (HCC) 01/30/2018   CXR   Fatty liver    Gait abnormality    GERD (gastroesophageal reflux disease)    History of hiatal hernia    HTN  (hypertension)    Hyperlipidemia    OSA on CPAP    Paroxysmal atrial fibrillation (HCC)    Plantar fasciitis, left    Pneumonia    History of    PONV (postoperative nausea and vomiting)    Pre-diabetes    Skin rash    under bil breast   Stress incontinence, female    Typical atrial flutter (HCC)    Past Surgical History:  Procedure Laterality Date   ANTERIOR AND POSTERIOR VAGINAL REPAIR     BLADDER SURGERY     BURCH PROCEDURE     COLONOSCOPY     ELECTROPHYSIOLOGIC STUDY N/A 12/22/2015   CTI and PVI ablation by Dr Kelsie   ELECTROPHYSIOLOGIC STUDY N/A 10/02/2016   Procedure: Atrial Fibrillation Ablation;  Surgeon: Lynwood Kelsie, MD;  Location: [facility] INVASIVE CV LAB;  Service: Cardiovascular;  Laterality: N/A;   EP IMPLANTABLE DEVICE N/A 08/11/2015   Procedure: Loop Recorder Insertion;  Surgeon: Lynwood Kelsie, MD;  Location: [facility] INVASIVE CV LAB;  Service: Cardiovascular;  Laterality: N/A;   implantable loop recorder removal  03/25/2020   MDT Reveal LINQ removed in office by Dr Kelsie   ROBOT ASSISTED LAPAROSCOPIC NEPHRECTOMY Left 03/07/2018   Procedure: XI ROBOTIC ASSISTED LAPAROSCOPIC NEPHRECTOMY;  Surgeon: Sherrilee Belvie CROME, MD;  Location: [facility];  Service: Urology;  Laterality: Left;   VAGINAL HYSTERECTOMY     Social History   Socioeconomic History   Marital status: Widowed    Spouse name: Not on file   Number of children: 2  Years of education: 23   Highest education level: Not on file  Occupational History   Occupation: Full time Librarian, academic  Tobacco Use   Smoking status: Never   Smokeless tobacco: Never  Vaping Use   Vaping status: Never Used  Substance and Sexual Activity   Alcohol use: Yes    Alcohol/week: 0.0 standard drinks of alcohol    Comment: Occasional   Drug use: No   Sexual activity: Not on file  Other Topics Concern   Not on file  Social History Narrative   Lives at home with husband.   2 biological children, 2 step-children.   Right-handed.    2-4 cups caffeine per day.   Social Drivers of Corporate investment banker Strain: Not on file  Food Insecurity: No Food Insecurity (09/30/2024)   Hunger Vital Sign    Worried About Running Out of Food in the Last Year: Never true    Ran Out of Food in the Last Year: Never true  Transportation Needs: No Transportation Needs (09/30/2024)   PRAPARE - Administrator, Civil Service (Medical): No    Lack of Transportation (Non-Medical): No  Physical Activity: Not on file  Stress: Not on file  Social Connections: Not on file    FAMILY HISTORY:  We obtained a detailed, 4-generation family history.  Significant diagnoses are listed below: Family History  Problem Relation Age of Onset   Breast cancer Mother 47   Colon cancer Mother 27   Cancer Mother    Heart block Father        probable   Bradycardia Father    Diabetes Maternal Grandmother    Diabetes Paternal Grandmother    Breast cancer Maternal Aunt    Heart disease Paternal Uncle    Colon cancer Maternal Aunt        Dx 50+   Colon cancer Maternal Uncle    Pedigree Summary:  Ms. Mealor has a family history of Mother - Breast Cancer at 73 and Colon Cancer at 41 Maternal Aunt - Breast Cancer at unknown age Maternal 1st Cousin Breast Cancer  Maternal Aunt 2 - Colon Cancer 50+ Maternal Uncle - Colon Cancer at 50+ Paternal 1st Cousin - Thyroid  Cancer at 80 Ms. Clendenen is unaware of relatives completing genetic testing for hereditary cancer risks.  There no reported Ashkenazi Jewish ancestry.   GENETIC COUNSELING ASSESSMENT: Ms. Canlas is a 73 y.o. female with a personal and family history of cancer which is somewhat suggestive of a hereditary cancer predisposition syndrome. We, therefore, discussed and recommended the following at today's visit.   DISCUSSION: We discussed that, in general, most cancer is not inherited in families, but instead is sporadic or familial. Sporadic cancers occur by chance and  typically happen at older ages (>50 years) as this type of cancer is caused by genetic changes acquired during an individual's lifetime. Some families have more cancers than would be expected by chance; however, the ages or types of cancer are not consistent with a known genetic mutation or known genetic mutations have been ruled out. This type of familial cancer is thought to be due to a combination of multiple genetic, environmental, hormonal, and lifestyle factors. While this combination of factors likely increases the risk of cancer, the exact source of this risk is not currently identifiable or testable.  We discussed that 5-10% of cancer is the result of germline (heritable) genetic variants, with most cases associated with BRCA1/BRCA2.  There are other genes  that can be associated with hereditary cancer syndromes.  These include MSH6, PMS2, MLH1, and MSH2 all associated with a hereditary colon and endometrial cancer syndrome, called Lynch Syndrome. We discussed that testing is beneficial for several reasons including knowing how to follow individuals after completing their treatment, identifying whether potential treatment options such as PARP inhibitors would be beneficial, and understanding if other family members could be at risk for cancer and allow them to undergo genetic testing.   We reviewed the characteristics, features and inheritance patterns of hereditary cancer syndromes. We also discussed genetic testing, including the appropriate family members to test, the process of testing, insurance coverage and turn-around-time for results. We discussed the implications of a negative, positive, carrier and/or variant of uncertain significant result. Ms. Grout  was offered a common hereditary cancer panel (40 genes) and an expanded pan-cancer panel (77 genes). Ms. Oravec was informed of the benefits and limitations of each panel, including that expanded pan-cancer panels contain genes that do not  have clear management guidelines at this point in time.  We also discussed that as the number of genes included on a panel increases, the chances of variants of uncertain significance increases.  GENETIC TESTING CONSENT:  After considering the risks, benefits, and limitations, Ms. Marron provided informed consent to pursue genetic testing. A blood sample was sent to [facility] for analysis of the CancerNext-Expanded+RNA Panel. Results should be available within approximately 2-3 weeks' time, at which point they will be disclosed by telephone to Ms. Wertenberger , as will any additional recommendations warranted by these results. Ms. Lowrimore will receive a summary of her genetic counseling visit and a copy of her results once available. This information will also be available in Epic.  The CancerNext-Expanded + RNAinsight gene panel offered by [employer] and includes sequencing, rearrangement, and RNA analysis for the following 77 genes: AIP, ALK, APC, ATM, AXIN2, BAP1, BARD1, BMPR1A, BRCA1, BRCA2, BRIP1, CDC73, CDH1, CDK4, CDKN1B, CDKN2A, CEBPA, CHEK2, CTNNA1, DDX41, DICER1, ETV6, FH, FLCN, GATA2, LZTR1, MAX, MBD4, MEN1, MET, MLH1, MSH2, MSH3, MSH6, MUTYH, NF1, NF2, NTHL1, PALB2, PHOX2B, PMS2, POT1, PRKAR1A, PTCH1, PTEN, RAD51C, RAD51D, RB1, RET, RPS20, RUNX1, SDHA, SDHAF2, SDHB, SDHC, SDHD, SMAD4, SMARCA4, SMARCB1, SMARCE1, STK11, SUFU, TMEM127, TP53, TSC1, TSC2, VHL, and WT1 (sequencing and deletion/duplication); EGFR, HOXB13, KIT, MITF, PDGFRA, POLD1, and POLE (sequencing only); EPCAM and GREM1 (deletion/duplication only).   Based on her personal history of renal cancer she was interested in adding genes associated with renal cancer risks. GENETIC TESTING NATIONAL CRITERIA: Based on Ms. Berrones personal and family history of three individuals with breast cancer on the same side of the family she meets medical criteria for genetic testing based on the [employer] (NCCN)  guidelines. Despite that she meets criteria, she may still have an out of pocket cost. We discussed that if her out of pocket cost for testing is over $100, the laboratory will call and confirm whether she wants to proceed with testing.  If the out of pocket cost of testing is less than $100 she will be billed by the genetic testing laboratory.   GENETIC INFORMATION NONDISCRIMINATION ACT (GINA): We discussed that some people do not want to undergo genetic testing due to fear of genetic discrimination.  The Genetic Information Nondiscrimination Act (GINA) was signed into federal law in 2008. GINA prohibits health insurers and most employers from discriminating against individuals based on genetic information (including the results of genetic tests and family history information). According to GINA,  health insurance companies cannot consider genetic information to be a preexisting condition, nor can they use it to make decisions regarding coverage or rates. GINA also makes it illegal for most employers to use genetic information in making decisions about hiring, firing, promotion, or terms of employment. It is important to note that GINA does not offer protections for life insurance, disability insurance, or long-term care insurance. GINA does not apply to those in the [employer], those who work for companies with less than 15 employees, and new life insurance or long-term disability insurance policies.  Health status due to a cancer diagnosis is not protected under GINA. More information about GINA can be found by visiting [employer]. Resources:  Ms. Teague was provided with the following:  [facility] Billing information  [facility] Hereditary Cancer Testing Patient Guide [facility] Patient Assistance Information Sheet PLAN:  Testing Ordered: CancerNext-Expanded+RNA  Clinic Note Faxed/Routed to Ms. Tiajuana PCP [name], MD   Santana Fryer, MS, [facility]  Certified Genetic  Counselor  Email: [email or web address]  Phone: [phone]  I personally spent a total of 25 minutes in the care of the patient today including preparing to see the patient, getting/reviewing separately obtained history, counseling and educating, placing orders, referring and communicating with other health care professionals, and documenting clinical information in the EHR. The patient brought her son, best friend and her daughter joined via phone. Drs. Lanny Stalls, and/or Gudena were available for questions, if needed. _______________________________________________________________________ For Office Staff:  Number of people involved in session: 3 Was an Intern/ student involved with case: no

## 2024-10-01 ENCOUNTER — Telehealth: Payer: Self-pay | Admitting: Pulmonary Disease

## 2024-10-01 ENCOUNTER — Other Ambulatory Visit: Payer: Self-pay | Admitting: *Deleted

## 2024-10-01 ENCOUNTER — Telehealth: Payer: Self-pay

## 2024-10-01 DIAGNOSIS — D0511 Intraductal carcinoma in situ of right breast: Secondary | ICD-10-CM

## 2024-10-01 DIAGNOSIS — Z808 Family history of malignant neoplasm of other organs or systems: Secondary | ICD-10-CM | POA: Insufficient documentation

## 2024-10-01 DIAGNOSIS — Z803 Family history of malignant neoplasm of breast: Secondary | ICD-10-CM | POA: Insufficient documentation

## 2024-10-01 DIAGNOSIS — Z800 Family history of malignant neoplasm of digestive organs: Secondary | ICD-10-CM | POA: Insufficient documentation

## 2024-10-01 NOTE — Telephone Encounter (Signed)
   Pre-operative Risk Assessment    Patient Name: Jacqueline [name]  DOB: 1951/09/29 MRN: [id number]   Date of last office visit: 06/04/23 Date of next office visit:    Request for Surgical Clearance    Procedure:  URGENT breast cancer surgery for right breast lumpectomy   Date of Surgery:  Clearance TBD clearance stated URGENT                                  Surgeon:  Dr. Donnice Bury  Surgeon's Group or Practice Name:  [facility]  Phone number:  [phone] Fax number:  [phone]   Type of Clearance Requested:   - Medical  - Pharmacy:  Hold Rivaroxaban  (Xarelto ) Not indicated    Type of Anesthesia:  General    Additional requests/questions:    Bonney Rebeca Blight   10/01/2024, 1:57 PM

## 2024-10-01 NOTE — Telephone Encounter (Signed)
   Pre-operative Risk Assessment    Patient Name: Jacqueline [name]  DOB: January 29, 1951 MRN: [id number]   Date of last office visit: 08/04/2023 Date of next office visit: TBD   Request for Surgical Clearance    Procedure:  Right breast lumpectomy  Date of Surgery:  Clearance TBD                                Surgeon:  Dr. Ebbie Socks Group or Practice Name:  [facility]  Phone number:  [phone] Fax number:  [phone]   Type of Clearance Requested:   - Medical  - Pharmacy:  Hold Rivaroxaban  (Xarelto ) 48 prior    Type of Anesthesia:  General    Additional requests/questions:    [name]   10/01/2024, 1:51 PM

## 2024-10-01 NOTE — Telephone Encounter (Signed)
 Patient with diagnosis of afib on Xarelto   for anticoagulation.    Procedure: Right breast lumpectomy  Date of procedure: TBD   CHA2DS2-VASc Score = 4   This indicates a 4.8% annual risk of stroke. The patient's score is based upon: CHF History: 0 HTN History: 1 Diabetes History: 0 Stroke History: 0 Vascular Disease History: 0 Age Score: 2 Gender Score: 1  CrCl 36 mL/min Platelet count 233 K  Patient has not  had an Afib/aflutter ablation in the last 3 months, DCCV within the last 4 weeks or a watchman implanted in the last 45 days    Per office protocol, patient can hold Xarelto   for 2 days prior to procedure.   Patient will not need bridging with Lovenox (enoxaparin) around procedure.  **This guidance is not considered finalized until pre-operative APP has relayed final recommendations.**

## 2024-10-01 NOTE — Telephone Encounter (Signed)
   Name: Jacqueline [name]  DOB: December 01, 1951  MRN: [id number]  Primary Cardiologist: Lynwood Rakers, MD (Inactive)  Chart reviewed as part of pre-operative protocol coverage. Because of Jacqueline [name] past medical history and time since last visit, she will require a follow-up in-office visit in order to better assess preoperative cardiovascular risk.  Pre-op covering staff: - Please schedule appointment and call patient to inform them. If patient already had an upcoming appointment within acceptable timeframe, please add pre-op clearance to the appointment notes so provider is aware. - Please contact requesting surgeon's office via preferred method (i.e, phone, fax) to inform them of need for appointment prior to surgery.  This message will also be routed to pharmacy pool for input on holding Xarelto  as requested below so that this information is available to the clearing provider at time of patient's appointment.   Romaine Maciolek D Curtis Uriarte, NP  10/01/2024, 3:50 PM

## 2024-10-01 NOTE — Telephone Encounter (Signed)
 Message was sent to EP scheduling team to get patient in for an appointment last seen by EP team

## 2024-10-02 NOTE — Telephone Encounter (Signed)
 Patient with diagnosis of atrial fibrillation on Carelto for anticoagulation.    Procedure:  URGENT breast cancer surgery for right breast lumpectomy    Date of Surgery:  Clearance TBD clearance stated URGENT   CHA2DS2-VASc Score = 4   This indicates a 4.8% annual risk of stroke. The patient's score is based upon: CHF History: 0 HTN History: 1 Diabetes History: 0 Stroke History: 0 Vascular Disease History: 0 Age Score: 2 Gender Score: 1   CrCl 46 Platelet count 233  Patient has not  had an Afib/aflutter ablation in the last 3 months, DCCV within the last 4 weeks or a watchman implanted in the last 45 days    Per office protocol, patient can hold Xarelto  for 2 days prior to procedure.   Patient will not need bridging with Lovenox (enoxaparin) around procedure.  **This guidance is not considered finalized until pre-operative APP has relayed final recommendations.**

## 2024-10-02 NOTE — Telephone Encounter (Signed)
 Pharmacy please advise on holding Xarelto  prior to  URGENT breast cancer surgery for right breast lumpectomy  scheduled for URGENT. Last labs 09/30/2024. Thank you.

## 2024-10-02 NOTE — Telephone Encounter (Signed)
 Request for office visit was sent yesterday 10/01/2024 by Jacqueline West, NP. The surgeon marked this as URGENT due to need for breast cancer lumpectomy, so needs to be seen ASAP.  I have gotten recommendations from pharmacy on Xarelto  and she will need to hold Trulicity  ASAP as well. If can be added on today or Monday that would be great.    Pharmacy 10/02/2024 Per office protocol, patient can hold Xarelto  for 2 days prior to procedure.   Patient will not need bridging with Lovenox (enoxaparin) around procedure.  Hold Trulicity ASAP.

## 2024-10-02 NOTE — Telephone Encounter (Signed)
 1st attempt to reach pt regarding surgical clearance and the need for an IN OFFICE appointment.  Left pt a detailed message to call back and get that scheduled ASAP

## 2024-10-06 ENCOUNTER — Other Ambulatory Visit: Payer: Self-pay

## 2024-10-06 ENCOUNTER — Encounter (HOSPITAL_BASED_OUTPATIENT_CLINIC_OR_DEPARTMENT_OTHER): Payer: Self-pay | Admitting: General Surgery

## 2024-10-06 NOTE — Progress Notes (Signed)
   10/06/24 1301  PAT Phone Screen  Is the patient taking a GLP-1 receptor agonist? (S)  Yes (Trulicity LD 10-05-24)  Has the patient been informed on holding medication? Yes  Do You Have Diabetes? Yes  Do You Have Hypertension? Yes  Have You Ever Been to the ER for Asthma? No  Have You Taken Oral Steroids in the Past 3 Months? No  Do you Take Phenteramine or any Other Diet Drugs? No  Recent  Lab Work, EKG, CXR? Yes  Where was this test performed? 09-30-24 had CBC/diff, CMP  Do you have a history of heart problems? (S)  Yes (PAF/flutter-s/p ablations x2 2017)  Cardiologist Name Daphne Barrack NP with cards  Have you ever had tests on your heart? Yes  What cardiac tests were performed? Echo;Stress Test  What date/year were cardiac tests completed? 05-2015 ECHO EF 55-60%, 2017 ETT-normal  Results viewable: [facility] Media Tab  Any Recent Hospitalizations? No  Height 5' 6 (1.676 m)  Weight 89.8 kg  Pat Appointment Scheduled (S)  Yes (will do EKG if not done by cards appt on 10-07-24)

## 2024-10-06 NOTE — Progress Notes (Unsigned)
 Electrophysiology Office Note:   Date:  10/07/2024  ID:  SHAWN DANNENBERG, DOB 03-16-1951, MRN [id number]  Primary Cardiologist: None Primary Heart Failure: None Electrophysiologist: Will Gladis Norton, MD      History of Present Illness:   Jacqueline [name] is a 73 y.o. female with h/o typical AFL / SVT, PAF s/p PVI/CTI ablation, HTN, HLD, OSA on CPAP, Renal Cell CA s/p L Nephrectomy seen today for cardiac clearance for breast lumpectomy & routine EP follow up.  Since last being seen in our clinic the patient reports doing well in regards to her heart rhythm. She has not had any recurrent AF episodes. No alerts from her Apple Watch.  Very happy with her ablation. She unfortunately has been diagnosed with breast cancer and is pending a lumpectomy.   She denies chest pain, palpitations, dyspnea, PND, orthopnea, nausea, vomiting, dizziness, syncope, edema, weight gain, or early satiety.  Review of systems complete and found to be negative unless listed in HPI.   EP Information / Studies Reviewed:    EKG is ordered today. Personal review as below.  EKG Interpretation Date/Time:  Wednesday October 07 2024 10:45:32 EDT Ventricular Rate:  65 PR Interval:  180 QRS Duration:  86 QT Interval:  444 QTC Calculation: 461 R Axis:   -11  Text Interpretation: Sinus rhythm with occasional Premature ventricular complexes Confirmed by Aniceto Jarvis ([id number]) on 10/07/2024 11:05:23 AM    Arrhythmia / AAD / Pertinent EP Studies AF / AFL / SVT  AF > dx ~ 2016, loop implanted > at RRT as of 2020 (removed 03/2020) EPS 12/2015 & 09/2016 > PVI, CTI ablation  Flecainide  2016 > 07/2015 stopped due to bradycardia, pauses associated with dizziness     Risk Assessment/Calculations:    CHA2DS2-VASc Score = 4   This indicates a 4.8% annual risk of stroke. The patient's score is based upon: CHF History: 0 HTN History: 1 Diabetes History: 0 Stroke History: 0 Vascular Disease History: 0 Age Score: 2 Gender  Score: 1             Physical Exam:   VS:  BP 112/62   Pulse 65   Ht 5' 6 (1.676 m)   Wt 198 lb 12.8 oz (90.2 kg)   SpO2 97%   BMI 32.09 kg/m    Wt Readings from Last 3 Encounters:  10/07/24 198 lb 12.8 oz (90.2 kg)  09/30/24 202 lb 9.6 oz (91.9 kg)  06/04/23 198 lb (89.8 kg)     GEN: Well nourished, well developed in no acute distress NECK: No JVD; No carotid bruits CARDIAC: Regular rate and rhythm, no murmurs, rubs, gallops RESPIRATORY:  Clear to auscultation without rales, wheezing or rhonchi  ABDOMEN: Soft, non-tender, non-distended EXTREMITIES:  No edema; No deformity   ASSESSMENT AND PLAN:    Paroxysmal Atrial Fibrillation  Atrial Flutter  CHA2DS2-VASc 4, s/p PVI, CTI ablation 2017 -OAC for stroke prophylaxis  -asymptomatic  / no AF burden  -monitors with an Apple Watch, reviewed wearable device technology   Secondary Hypercoagulable State  -continue Xarelto  15mg  (single kidney)  Sinus Bradycardia  -asymptomatic  -avoid AV nodal blocking agents   Hypertension  -well controlled on current regimen   HLD  -per primary    Pre-Procedure Clearance  Request for Surgical Clearance Procedure:  URGENT breast cancer surgery for right breast lumpectomy  Date of Surgery:  Clearance TBD clearance stated URGENT  Surgeon:  Dr. Donnice Bury  Surgeon's Group or Practice Name:  [facility]  Phone number:  [phone] Fax number:  [phone] Type of Clearance Requested:   - Medical  - Pharmacy:  Hold Rivaroxaban  (Xarelto ) Not indicated  Type of Anesthesia:  General      Jacqueline [name] perioperative risk of a major cardiac event is 0.4% according to the Revised Cardiac Risk Index (RCRI).  Therefore, she is at low risk for perioperative complications.     Recommendations: According to ACC/AHA guidelines, no further cardiovascular testing needed.  The patient may proceed to surgery at acceptable risk.     Antiplatelet and/or Anticoagulation Recommendations:  Xarelto  (Rivaroxaban ) can be held for 2 days prior to surgery.  Please resume post op when felt to be safe.      Follow up with Dr. Inocencio in 12 months  Signed, Daphne Barrack, NP-C, AGACNP-BC [facility] - Electrophysiology  10/07/2024, 12:38 PM

## 2024-10-07 ENCOUNTER — Encounter: Payer: Self-pay | Admitting: Pulmonary Disease

## 2024-10-07 ENCOUNTER — Ambulatory Visit: Attending: Pulmonary Disease | Admitting: Pulmonary Disease

## 2024-10-07 VITALS — BP 112/62 | HR 65 | Ht 66.0 in | Wt 198.8 lb

## 2024-10-07 DIAGNOSIS — R001 Bradycardia, unspecified: Secondary | ICD-10-CM | POA: Diagnosis not present

## 2024-10-07 DIAGNOSIS — D6869 Other thrombophilia: Secondary | ICD-10-CM

## 2024-10-07 DIAGNOSIS — I10 Essential (primary) hypertension: Secondary | ICD-10-CM | POA: Diagnosis not present

## 2024-10-07 DIAGNOSIS — I4892 Unspecified atrial flutter: Secondary | ICD-10-CM

## 2024-10-07 DIAGNOSIS — I480 Paroxysmal atrial fibrillation: Secondary | ICD-10-CM

## 2024-10-07 NOTE — Progress Notes (Signed)
  G2 given with written/verbal instruction; CHG soap given with written and verbal instruction. Pt verbalized understanding.        Enhanced Recovery after Surgery for Orthopedics Enhanced Recovery after Surgery is a protocol used to improve the stress on your body and your recovery after surgery.  Patient Instructions  The night before surgery:  No food after midnight. ONLY clear liquids after midnight  The day of surgery (if you do NOT have diabetes):  Drink ONE (1) Pre-Surgery Clear Ensure as directed.   This drink was given to you during your hospital  pre-op appointment visit. The pre-op nurse will instruct you on the time to drink the  Pre-Surgery Ensure depending on your surgery time. Finish the drink at the designated time by the pre-op nurse.  Nothing else to drink after completing the  Pre-Surgery Clear Ensure.  The day of surgery (if you have diabetes): Drink ONE (1) Gatorade 2 (G2) as directed. This drink was given to you during your hospital  pre-op appointment visit.  The pre-op nurse will instruct you on the time to drink the   Gatorade 2 (G2) depending on your surgery time. Color of the Gatorade may vary. Red is not allowed. Nothing else to drink after completing the  Gatorade 2 (G2).         If you have questions, please contact your surgeon's office.

## 2024-10-07 NOTE — Telephone Encounter (Signed)
 2nd attempt: Called patient to schedule in-office visit for cardiac clearance. NA, left message on VM to contact our office.

## 2024-10-07 NOTE — Progress Notes (Signed)
 Note faxed to surgeons office as requested.

## 2024-10-07 NOTE — Patient Instructions (Signed)
 Medication Instructions:  Your physician recommends that you continue on your current medications as directed. Please refer to the Current Medication list given to you today.  *If you need a refill on your cardiac medications before your next appointment, please call your pharmacy*  Lab Work: None ordered If you have labs (blood work) drawn today and your tests are completely normal, you will receive your results only by: MyChart Message (if you have MyChart) OR A paper copy in the mail If you have any lab test that is abnormal or we need to change your treatment, we will call you to review the results.  Follow-Up: At [facility], you and your health needs are our priority.  As part of our continuing mission to provide you with exceptional heart care, our providers are all part of one team.  This team includes your primary Cardiologist (physician) and Advanced Practice Providers or APPs (Physician Assistants and Nurse Practitioners) who all work together to provide you with the care you need, when you need it.  Your next appointment:   1 year(s)  Provider:   Agatha Horsfall, MD

## 2024-10-08 ENCOUNTER — Encounter: Payer: Self-pay | Admitting: *Deleted

## 2024-10-08 ENCOUNTER — Telehealth: Payer: Self-pay | Admitting: *Deleted

## 2024-10-08 NOTE — Telephone Encounter (Signed)
 [facility] f/u call. Spoke to patient and answered questions. She has picked up her bra already for surgery. No other questions at this time. Has navigators number if needed.

## 2024-10-09 NOTE — Telephone Encounter (Signed)
 3 rd attempt: Called patient to schedule in-office visit for cardiac clearance. NA, left message on VM to contact our office

## 2024-10-09 NOTE — Telephone Encounter (Signed)
 Will send notes to surgeon office the pt is needing an appt in office per preop APP. Pt needs to call back to [phone] and let the scheduler know she needs in office appt for preop clearance.

## 2024-10-11 NOTE — H&P (Signed)
 73 yof who is on xarelto  for afib, otherwise healthy who has screening mm that shows a focal asymmetry with calcs in right breast. There is a 6x6x3 mm mass. Axillary US  is negative. Biopsy is grade II-III DCIS with focus of microinvasion < 1 mm. This is 100% er pos, 60% pr pos.  No prior breast history.. she has fh in her mom at age 75 and maternal aunt in her 65s.  Review of Systems: A complete review of systems was obtained from the patient. I have reviewed this information and discussed as appropriate with the patient. See HPI as well for other ROS.  Review of Systems  Musculoskeletal: Positive for joint pain.  All other systems reviewed and are negative.  Medical History: Past Medical History:  Diagnosis Date  Arthritis  GERD (gastroesophageal reflux disease)  History of cancer  Hyperlipidemia  Hypertension   Patient Active Problem List  Diagnosis  Ductal carcinoma in situ (DCIS) of right breast   Past Surgical History:  Procedure Laterality Date  ANTERIOR AND POSTERIOR VAGINAL REPAIR  bladder surgery  HYSTERECTOMY  NEPHRECTOMY  2019    Allergies  Allergen Reactions  Lisinopril  Unknown  Cough   Current Outpatient Medications on File Prior to Visit  Medication Sig Dispense Refill  famotidine  (PEPCID ) 20 MG tablet Take 20 mg by mouth 2 (two) times daily  fluticasone propionate (FLONASE) 50 mcg/actuation nasal spray Place 1 spray into one nostril  losartan  (COZAAR ) 25 MG tablet Take 75 mg by mouth once daily  pantoprazole  (PROTONIX ) 40 MG DR tablet Take 40 mg by mouth once daily  pramipexole  (MIRAPEX ) 0.25 MG tablet Take 0.25-0.5 mg by mouth  rosuvastatin (CRESTOR) 10 MG tablet Take 10 mg by mouth at bedtime  XARELTO  15 mg tablet Take 15 mg by mouth once daily    Family History  Problem Relation Age of Onset  Colon cancer Mother  Breast cancer Mother   Social History   Tobacco Use  Smoking Status Never  Smokeless Tobacco Never  Marital status: Married   Tobacco Use  Smoking status: Never  Smokeless tobacco: Never  Substance and Sexual Activity  Alcohol use: Never  Drug use: Never    Objective:   Physical Exam Vitals reviewed.  Constitutional:  Appearance: Normal appearance.  Chest:  Breasts: Right: No inverted nipple, mass or nipple discharge.  Left: No inverted nipple, mass or nipple discharge.  Lymphadenopathy:  Upper Body:  Right upper body: No supraclavicular or axillary adenopathy.  Left upper body: No supraclavicular or axillary adenopathy.  Neurological:  Mental Status: She is alert.   Assessment and Plan:   Ductal carcinoma in situ (DCIS) of right breast  RIght breast seed guided lumpectomy  We discussed the staging and pathophysiology of breast cancer. We discussed all of the different options for treatment for breast cancer including surgery, chemotherapy, radiation therapy, Herceptin, and antiestrogen therapy.  We discussed a sentinel lymph node biopsy as she does not appear to having lymph node involvement right now but I think with DCIS and small area of microinvasion and high er positivity at age 73 we can omit sn biopsy.  We discussed the options for treatment of the breast cancer which included lumpectomy versus a mastectomy. We discussed the performance of the lumpectomy with radioactive seed placement. We discussed a 5-10% chance of a positive margin requiring reexcision in the operating room. We also discussed that she can consider radiation therapy if she undergoes lumpectomy. We discussed mastectomy and the postoperative care for that  as well. Mastectomy can be followed by reconstruction. TMost mastectomy patients will not need radiation therapy. We discussed that there is no difference in her survival whether she undergoes lumpectomy with radiation therapy or antiestrogen therapy versus a mastectomy. There is also no real difference between her recurrence in the breast.  We discussed the risks of  operation including bleeding, infection, possible reoperation. She understands her further therapy will be based on what her stages at the time of her operation.

## 2024-10-12 ENCOUNTER — Inpatient Hospital Stay
Admission: RE | Admit: 2024-10-12 | Discharge: 2024-10-12 | Disposition: A | Payer: Self-pay | Source: Ambulatory Visit | Attending: Radiation Oncology | Admitting: Radiation Oncology

## 2024-10-12 ENCOUNTER — Other Ambulatory Visit: Payer: Self-pay | Admitting: Radiation Oncology

## 2024-10-12 DIAGNOSIS — D0511 Intraductal carcinoma in situ of right breast: Secondary | ICD-10-CM

## 2024-10-12 DIAGNOSIS — R921 Mammographic calcification found on diagnostic imaging of breast: Secondary | ICD-10-CM | POA: Diagnosis not present

## 2024-10-13 ENCOUNTER — Other Ambulatory Visit: Payer: Self-pay | Admitting: Radiation Oncology

## 2024-10-13 ENCOUNTER — Encounter (HOSPITAL_BASED_OUTPATIENT_CLINIC_OR_DEPARTMENT_OTHER): Payer: Self-pay | Admitting: General Surgery

## 2024-10-13 ENCOUNTER — Ambulatory Visit (HOSPITAL_BASED_OUTPATIENT_CLINIC_OR_DEPARTMENT_OTHER)
Admission: RE | Admit: 2024-10-13 | Discharge: 2024-10-13 | Disposition: A | Discharge status: Home / Self Care | Attending: General Surgery | Admitting: General Surgery

## 2024-10-13 ENCOUNTER — Ambulatory Visit (HOSPITAL_BASED_OUTPATIENT_CLINIC_OR_DEPARTMENT_OTHER): Admitting: Anesthesiology

## 2024-10-13 ENCOUNTER — Inpatient Hospital Stay
Admission: RE | Admit: 2024-10-13 | Discharge: 2024-10-13 | Disposition: A | Discharge status: Home / Self Care | Payer: Self-pay | Source: Ambulatory Visit | Attending: Radiation Oncology | Admitting: Radiation Oncology

## 2024-10-13 ENCOUNTER — Encounter (HOSPITAL_BASED_OUTPATIENT_CLINIC_OR_DEPARTMENT_OTHER)
Admission: RE | Disposition: A | Discharge status: Home / Self Care | Payer: Self-pay | Source: Home / Self Care | Attending: General Surgery

## 2024-10-13 DIAGNOSIS — Z170 Estrogen receptor positive status [ER+]: Secondary | ICD-10-CM | POA: Insufficient documentation

## 2024-10-13 DIAGNOSIS — Z1731 Human epidermal growth factor receptor 2 positive status: Secondary | ICD-10-CM | POA: Insufficient documentation

## 2024-10-13 DIAGNOSIS — M1990 Unspecified osteoarthritis, unspecified site: Secondary | ICD-10-CM | POA: Insufficient documentation

## 2024-10-13 DIAGNOSIS — Z1722 Progesterone receptor negative status: Secondary | ICD-10-CM | POA: Insufficient documentation

## 2024-10-13 DIAGNOSIS — Z79899 Other long term (current) drug therapy: Secondary | ICD-10-CM | POA: Diagnosis not present

## 2024-10-13 DIAGNOSIS — C50911 Malignant neoplasm of unspecified site of right female breast: Secondary | ICD-10-CM | POA: Insufficient documentation

## 2024-10-13 DIAGNOSIS — G4730 Sleep apnea, unspecified: Secondary | ICD-10-CM | POA: Insufficient documentation

## 2024-10-13 DIAGNOSIS — D0511 Intraductal carcinoma in situ of right breast: Secondary | ICD-10-CM | POA: Diagnosis not present

## 2024-10-13 DIAGNOSIS — K219 Gastro-esophageal reflux disease without esophagitis: Secondary | ICD-10-CM | POA: Diagnosis not present

## 2024-10-13 DIAGNOSIS — I129 Hypertensive chronic kidney disease with stage 1 through stage 4 chronic kidney disease, or unspecified chronic kidney disease: Secondary | ICD-10-CM | POA: Diagnosis not present

## 2024-10-13 DIAGNOSIS — Z7901 Long term (current) use of anticoagulants: Secondary | ICD-10-CM | POA: Diagnosis not present

## 2024-10-13 DIAGNOSIS — N189 Chronic kidney disease, unspecified: Secondary | ICD-10-CM | POA: Insufficient documentation

## 2024-10-13 DIAGNOSIS — I4891 Unspecified atrial fibrillation: Secondary | ICD-10-CM | POA: Insufficient documentation

## 2024-10-13 DIAGNOSIS — Z01818 Encounter for other preprocedural examination: Secondary | ICD-10-CM

## 2024-10-13 DIAGNOSIS — E1122 Type 2 diabetes mellitus with diabetic chronic kidney disease: Secondary | ICD-10-CM | POA: Insufficient documentation

## 2024-10-13 DIAGNOSIS — Z905 Acquired absence of kidney: Secondary | ICD-10-CM | POA: Insufficient documentation

## 2024-10-13 DIAGNOSIS — K449 Diaphragmatic hernia without obstruction or gangrene: Secondary | ICD-10-CM | POA: Diagnosis not present

## 2024-10-13 HISTORY — DX: Type 2 diabetes mellitus without complications: E11.9

## 2024-10-13 LAB — GLUCOSE, CAPILLARY
Glucose-Capillary: 92 mg/dL (ref 70–99)
Glucose-Capillary: 117 mg/dL — ABNORMAL HIGH (ref 70–99)

## 2024-10-13 SURGERY — LUMPECTOMY WITH MAGNETIC MARKER LOCALIZATION
Anesthesia: General | Site: Breast | Laterality: Right

## 2024-10-13 MED ORDER — GLYCOPYRROLATE 0.2 MG/ML IJ SOLN
INTRAMUSCULAR | Status: DC | PRN
Start: 2024-10-13 — End: 2024-10-13
  Administered 2024-10-13: .2 mg via INTRAVENOUS

## 2024-10-13 MED ORDER — ONDANSETRON HCL 4 MG/2ML IJ SOLN
INTRAMUSCULAR | Status: AC
  Filled 2024-10-13: qty 2

## 2024-10-13 MED ORDER — CEFAZOLIN SODIUM-DEXTROSE 2-4 GM/100ML-% IV SOLN
INTRAVENOUS | Status: AC
  Filled 2024-10-13: qty 100

## 2024-10-13 MED ORDER — ENSURE PRE-SURGERY PO LIQD: 296.0000 mL | Freq: Once | ORAL | Status: DC

## 2024-10-13 MED ORDER — FENTANYL CITRATE (PF) 100 MCG/2ML IJ SOLN: 25.0000 ug | INTRAMUSCULAR | Status: DC | PRN

## 2024-10-13 MED ORDER — PROPOFOL 10 MG/ML IV BOLUS
INTRAVENOUS | Status: AC
  Filled 2024-10-13: qty 20

## 2024-10-13 MED ORDER — ONDANSETRON HCL 4 MG/2ML IJ SOLN
INTRAMUSCULAR | Status: DC | PRN
  Administered 2024-10-13: 4 mg via INTRAVENOUS

## 2024-10-13 MED ORDER — OXYCODONE HCL 5 MG/5ML PO SOLN: 5.0000 mg | Freq: Once | ORAL | Status: DC | PRN

## 2024-10-13 MED ORDER — PHENYLEPHRINE HCL (PRESSORS) 10 MG/ML IV SOLN
INTRAVENOUS | Status: DC | PRN
  Administered 2024-10-13 (×6): 80 ug via INTRAVENOUS

## 2024-10-13 MED ORDER — 0.9 % SODIUM CHLORIDE (POUR BTL) OPTIME
TOPICAL | Status: DC | PRN
  Administered 2024-10-13: 1000 mL

## 2024-10-13 MED ORDER — PROPOFOL 500 MG/50ML IV EMUL
INTRAVENOUS | Status: DC | PRN
  Administered 2024-10-13: 50 ug/kg/min via INTRAVENOUS

## 2024-10-13 MED ORDER — BUPIVACAINE HCL (PF) 0.25 % IJ SOLN
INTRAMUSCULAR | Status: AC
  Filled 2024-10-13: qty 30

## 2024-10-13 MED ORDER — LACTATED RINGERS IV SOLN: INTRAVENOUS | Status: DC

## 2024-10-13 MED ORDER — CHLORHEXIDINE GLUCONATE CLOTH 2 % EX PADS: 6.0000 | MEDICATED_PAD | Freq: Once | CUTANEOUS | Status: DC

## 2024-10-13 MED ORDER — PROPOFOL 10 MG/ML IV BOLUS
INTRAVENOUS | Status: DC | PRN
  Administered 2024-10-13: 50 ug via INTRAVENOUS
  Administered 2024-10-13: 150 ug via INTRAVENOUS
  Administered 2024-10-13: 50 ug via INTRAVENOUS

## 2024-10-13 MED ORDER — AMISULPRIDE (ANTIEMETIC) 5 MG/2ML IV SOLN: 10.0000 mg | Freq: Once | INTRAVENOUS | Status: DC | PRN

## 2024-10-13 MED ORDER — CEFAZOLIN SODIUM-DEXTROSE 2-4 GM/100ML-% IV SOLN: 2.0000 g | INTRAVENOUS | Status: DC

## 2024-10-13 MED ORDER — LACTATED RINGERS IV SOLN
INTRAVENOUS | Status: DC | PRN
Start: 2024-10-13 — End: 2024-10-13

## 2024-10-13 MED ORDER — PROPOFOL 500 MG/50ML IV EMUL
INTRAVENOUS | Status: AC
  Filled 2024-10-13: qty 50

## 2024-10-13 MED ORDER — LIDOCAINE 2% (20 MG/ML) 5 ML SYRINGE
INTRAMUSCULAR | Status: DC | PRN
  Administered 2024-10-13: 60 mg via INTRAVENOUS

## 2024-10-13 MED ORDER — OXYCODONE HCL 5 MG PO TABS: 5.0000 mg | ORAL_TABLET | Freq: Once | ORAL | Status: DC | PRN

## 2024-10-13 MED ORDER — LIDOCAINE 2% (20 MG/ML) 5 ML SYRINGE
INTRAMUSCULAR | Status: AC
Start: 2024-10-13 — End: 2024-10-13
  Filled 2024-10-13: qty 5

## 2024-10-13 MED ORDER — CEFAZOLIN SODIUM-DEXTROSE 2-3 GM-%(50ML) IV SOLR
INTRAVENOUS | Status: DC | PRN
  Administered 2024-10-13: 2 g via INTRAVENOUS

## 2024-10-13 MED ORDER — EPHEDRINE SULFATE (PRESSORS) 25 MG/5ML IV SOSY
PREFILLED_SYRINGE | INTRAVENOUS | Status: DC | PRN
Start: 2024-10-13 — End: 2024-10-13
  Administered 2024-10-13: 10 mg via INTRAVENOUS

## 2024-10-13 MED ORDER — ACETAMINOPHEN 500 MG PO TABS
1000.0000 mg | ORAL_TABLET | ORAL | Status: AC
  Administered 2024-10-13: 1000 mg via ORAL

## 2024-10-13 MED ORDER — DEXAMETHASONE SOD PHOSPHATE PF 10 MG/ML IJ SOLN
INTRAMUSCULAR | Status: DC | PRN
  Administered 2024-10-13: 5 mg via INTRAVENOUS

## 2024-10-13 MED ORDER — GLYCOPYRROLATE PF 0.2 MG/ML IJ SOSY
PREFILLED_SYRINGE | INTRAMUSCULAR | Status: AC
  Filled 2024-10-13: qty 1

## 2024-10-13 MED ORDER — BUPIVACAINE HCL (PF) 0.25 % IJ SOLN
INTRAMUSCULAR | Status: DC | PRN
  Administered 2024-10-13: 10 mL

## 2024-10-13 MED ORDER — FENTANYL CITRATE (PF) 100 MCG/2ML IJ SOLN
INTRAMUSCULAR | Status: AC
  Filled 2024-10-13: qty 2

## 2024-10-13 MED ORDER — FENTANYL CITRATE (PF) 100 MCG/2ML IJ SOLN
INTRAMUSCULAR | Status: DC | PRN
  Administered 2024-10-13 (×2): 25 ug via INTRAVENOUS
  Administered 2024-10-13: 50 ug via INTRAVENOUS

## 2024-10-13 MED ORDER — EPHEDRINE 5 MG/ML INJ
INTRAVENOUS | Status: AC
  Filled 2024-10-13: qty 5

## 2024-10-13 MED ORDER — PHENYLEPHRINE 80 MCG/ML (10ML) SYRINGE FOR IV PUSH (FOR BLOOD PRESSURE SUPPORT)
PREFILLED_SYRINGE | INTRAVENOUS | Status: AC
  Filled 2024-10-13: qty 10

## 2024-10-13 SURGICAL SUPPLY — 38 items
BINDER BREAST XLRG (GAUZE/BANDAGES/DRESSINGS) IMPLANT
BINDER BREAST XXLRG (GAUZE/BANDAGES/DRESSINGS) IMPLANT
BLADE SURG 15 STRL LF DISP TIS (BLADE) ×1 IMPLANT
CANISTER SUC SOCK COL 7IN (MISCELLANEOUS) IMPLANT
CANISTER SUCT 1200ML W/VALVE (MISCELLANEOUS) IMPLANT
CHLORAPREP W/TINT 26 (MISCELLANEOUS) ×1 IMPLANT
CLIP APPLIE 9.375 MED OPEN (MISCELLANEOUS) IMPLANT
COVER BACK TABLE 60X90IN (DRAPES) ×1 IMPLANT
COVER MAYO STAND STRL (DRAPES) ×1 IMPLANT
COVER PROBE CYLINDRICAL 5X96 (MISCELLANEOUS) ×1 IMPLANT
DERMABOND ADVANCED .7 DNX12 (GAUZE/BANDAGES/DRESSINGS) ×1 IMPLANT
DRAPE LAPAROSCOPIC ABDOMINAL (DRAPES) ×1 IMPLANT
DRAPE UTILITY XL STRL (DRAPES) ×1 IMPLANT
ELECT COATED BLADE 2.86 ST (ELECTRODE) ×1 IMPLANT
ELECTRODE REM PT RTRN 9FT ADLT (ELECTROSURGICAL) ×1 IMPLANT
GLOVE BIO SURGEON STRL SZ7 (GLOVE) ×2 IMPLANT
GLOVE BIOGEL PI IND STRL 7.0 (GLOVE) IMPLANT
GLOVE BIOGEL PI IND STRL 7.5 (GLOVE) ×1 IMPLANT
GOWN STRL REUS W/ TWL LRG LVL3 (GOWN DISPOSABLE) ×2 IMPLANT
KIT MARKER MARGIN INK (KITS) ×1 IMPLANT
NDL HYPO 25X1 1.5 SAFETY (NEEDLE) ×1 IMPLANT
NEEDLE HYPO 25X1 1.5 SAFETY (NEEDLE) ×1 IMPLANT
NS IRRIG 1000ML POUR BTL (IV SOLUTION) IMPLANT
PACK BASIN DAY SURGERY FS (CUSTOM PROCEDURE TRAY) ×1 IMPLANT
PENCIL SMOKE EVACUATOR (MISCELLANEOUS) ×1 IMPLANT
SLEEVE SCD COMPRESS KNEE MED (STOCKING) ×1 IMPLANT
SPIKE FLUID TRANSFER (MISCELLANEOUS) IMPLANT
SPONGE T-LAP 4X18 ~~LOC~~+RFID (SPONGE) ×1 IMPLANT
STRIP CLOSURE SKIN 1/2X4 (GAUZE/BANDAGES/DRESSINGS) ×1 IMPLANT
SUT MNCRL AB 4-0 PS2 18 (SUTURE) ×1 IMPLANT
SUT MON AB 5-0 PS2 18 (SUTURE) IMPLANT
SUT VIC AB 2-0 SH 27XBRD (SUTURE) ×1 IMPLANT
SUT VIC AB 3-0 SH 27X BRD (SUTURE) ×1 IMPLANT
SYR CONTROL 10ML LL (SYRINGE) ×1 IMPLANT
TOWEL GREEN STERILE FF (TOWEL DISPOSABLE) ×1 IMPLANT
TRAY FAXITRON CT DISP (TRAY / TRAY PROCEDURE) ×1 IMPLANT
TUBE CONNECTING 20X1/4 (TUBING) IMPLANT
YANKAUER SUCT BULB TIP NO VENT (SUCTIONS) IMPLANT

## 2024-10-13 NOTE — Anesthesia Preprocedure Evaluation (Signed)
 Anesthesia Evaluation  Patient identified by MRN, date of birth, ID band Patient awake    Reviewed: Allergy & Precautions, NPO status , Patient's Chart, lab work & pertinent test results  History of Anesthesia Complications (+) PONV and history of anesthetic complications  Airway Mallampati: II  TM Distance: >3 FB Neck ROM: Full    Dental  (+) Dental Advisory Given   Pulmonary sleep apnea and Continuous Positive Airway Pressure Ventilation    breath sounds clear to auscultation       Cardiovascular hypertension, Pt. on medications + dysrhythmias Atrial Fibrillation  Rhythm:Regular Rate:Normal     Neuro/Psych negative neurological ROS     GI/Hepatic Neg liver ROS, hiatal hernia,GERD  ,,  Endo/Other  diabetes, Type 2    Renal/GU CRFRenal disease (s/p nephrectomy)     Musculoskeletal  (+) Arthritis ,    Abdominal   Peds  Hematology  (+) Blood dyscrasia   Anesthesia Other Findings   Reproductive/Obstetrics                              Anesthesia Physical Anesthesia Plan  ASA: 2  Anesthesia Plan: General   Post-op Pain Management: Tylenol  PO (pre-op)*   Induction: Intravenous  PONV Risk Score and Plan: 4 or greater and TIVA, Propofol  infusion, Dexamethasone , Ondansetron  and Treatment may vary due to age or medical condition  Airway Management Planned: LMA  Additional Equipment:   Intra-op Plan:   Post-operative Plan: Extubation in OR  Informed Consent: I have reviewed the patients History and Physical, chart, labs and discussed the procedure including the risks, benefits and alternatives for the proposed anesthesia with the patient or authorized representative who has indicated his/her understanding and acceptance.     Dental advisory given  Plan Discussed with: CRNA  Anesthesia Plan Comments:         Anesthesia Quick Evaluation

## 2024-10-13 NOTE — Discharge Instructions (Addendum)
 [facility] Office Phone Number [phone]  POST OP INSTRUCTIONS Take 400 mg of ibuprofen every 8 hours or 650 mg tylenol  every 6 hours for next 72 hours then as needed. Use ice several times daily also.  A prescription for pain medication may be given to you upon discharge.  Take your pain medication as prescribed, if needed.  If narcotic pain medicine is not needed, then you may take acetaminophen  (Tylenol ), naprosyn (Alleve) or ibuprofen (Advil) as needed. Take your usually prescribed medications unless otherwise directed If you need a refill on your pain medication, please contact your pharmacy.  They will contact our office to request authorization.  Prescriptions will not be filled after 5pm or on week-ends. You should eat very light the first 24 hours after surgery, such as soup, crackers, pudding, etc.  Resume your normal diet the day after surgery. Most patients will experience some swelling and bruising in the breast.  Ice packs and a good support bra will help.  Wear the breast binder provided or a sports bra for 72 hours day and night.  After that wear a sports bra during the day until you return to the office. Swelling and bruising can take several days to resolve.  It is common to experience some constipation if taking pain medication after surgery.  Increasing fluid intake and taking a stool softener will usually help or prevent this problem from occurring.  A mild laxative (Milk of Magnesia or Miralax) should be taken according to package directions if there are no bowel movements after 48 hours. I used skin glue on the incision, you may shower in 24 hours.  The glue will flake off over the next 2-3 weeks as will the steristrips.   Any sutures or staples will be removed at the office during your follow-up visit. ACTIVITIES:  You may resume regular daily activities (gradually increasing) beginning the next day.  Wearing a good support bra or sports bra minimizes pain and  swelling.  You may have sexual intercourse when it is comfortable. You may drive when you no longer are taking prescription pain medication, you can comfortably wear a seatbelt, and you can safely maneuver your car and apply brakes. RETURN TO WORK:  ______________________________________________________________________________________ Jacqueline [name] should see your doctor in the office for a follow-up appointment approximately two to three weeks after your surgery.  Your doctor's nurse will typically make your follow-up appointment when she calls you with your pathology report.  Expect your pathology report 3-4 business days after your surgery.  You may call to check if you do not hear from us  after three days.  WHEN TO CALL DR WAKEFIELD: Fever over 101.0 Nausea and/or vomiting. Extreme swelling or bruising. Continued bleeding from incision. Increased pain, redness, or drainage from the incision.  The clinic staff is available to answer your questions during regular business hours.  Please don't hesitate to call and ask to speak to one of the nurses for clinical concerns.  If you have a medical emergency, go to the nearest emergency room or call 911.  A surgeon from [facility] is always on call at the hospital.  For further questions, please visit [email or web address]   No Tylenol  before 5:50pm today.   Post Anesthesia Home Care Instructions  Activity: Get plenty of rest for the remainder of the day. A responsible individual must stay with you for 24 hours following the procedure.  For the next 24 hours, DO NOT: -Drive a car -Advertising copywriter -Drink alcoholic  beverages -Take any medication unless instructed by your physician -Make any legal decisions or sign important papers.  Meals: Start with liquid foods such as gelatin or soup. Progress to regular foods as tolerated. Avoid greasy, spicy, heavy foods. If nausea and/or vomiting occur, drink only clear liquids until the  nausea and/or vomiting subsides. Call your physician if vomiting continues.  Special Instructions/Symptoms: Your throat may feel dry or sore from the anesthesia or the breathing tube placed in your throat during surgery. If this causes discomfort, gargle with warm salt water . The discomfort should disappear within 24 hours.  If you had a scopolamine  patch placed behind your ear for the management of post- operative nausea and/or vomiting:  1. The medication in the patch is effective for 72 hours, after which it should be removed.  Wrap patch in a tissue and discard in the trash. Wash hands thoroughly with soap and water . 2. You may remove the patch earlier than 72 hours if you experience unpleasant side effects which may include dry mouth, dizziness or visual disturbances. 3. Avoid touching the patch. Wash your hands with soap and water  after contact with the patch.

## 2024-10-13 NOTE — Op Note (Signed)
 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 Adina Bury Anesthesia: General Estimated blood loss: Minimal Specimens: Right breast tissue containing seed and clip marked with paint Complications: None Drains: None Sponge needle count was correct at completion Disposition to recovery stable addition  Indications: This is a 73 year old female who had a screening mammogram shows a focal asymmetry with calcifications.  There is a 6 x 6 x 3 mm mass and some associated calcifications.  Axillary ultrasound is negative.  Biopsy showed a grade 2-3 DCIS with a focus of microinvasion that was 100% ER +60% PR positive.  We discussed options and elected to proceed with lumpectomy alone.  Procedure: After informed consent was obtained she was taken to the operating room.  She was given antibiotics.  SCDs were placed.  She had a Magseed placed prior to beginning I had these images available in the operating room.  She was then placed under general anesthesia without complication.  She was prepped and draped in a standard sterile surgical fashion.  Surgical timeout was then performed.  I located the [facility] in the lower inner quadrant.  I then made a periareolar incision after infiltrating Marcaine  throughout this area.  I then tunneled to the seed.  I then remove the seed in the surrounding tissue with an attempt to get a clear margin.  Mammogram confirmed removal of the seed and the clip.  On 3D images it looks like all my margins were clear and I removed the tissue anterior to this that included calcifications.  I then obtained hemostasis.  I closed the breast tissue with 2-0 Vicryl.  The skin was closed with 3-0 Vicryl and 5-0 Monocryl.  Glue and Steri-Strips were applied.  She tolerated this well was extubated and transferred recovery stable.

## 2024-10-13 NOTE — Anesthesia Procedure Notes (Signed)
 Procedure Name: LMA Insertion Date/Time: 10/13/2024 12:49 PM  Performed by: Pam Macario BROCKS, CRNAPre-anesthesia Checklist: Patient identified, Emergency Drugs available, Suction available, Patient being monitored and Timeout performed Patient Re-evaluated:Patient Re-evaluated prior to induction Oxygen Delivery Method: Circle system utilized Preoxygenation: Pre-oxygenation with 100% oxygen Induction Type: IV induction Ventilation: Mask ventilation without difficulty LMA: LMA inserted LMA Size: 4.0 Number of attempts: 1 Airway Equipment and Method: Bite block Placement Confirmation: positive ETCO2, breath sounds checked- equal and bilateral and CO2 detector Tube secured with: Tape Dental Injury: Teeth and Oropharynx as per pre-operative assessment  Comments: Several adjustments with LMA to place correctly for TV and ETCO2 BBS equal

## 2024-10-13 NOTE — Anesthesia Postprocedure Evaluation (Signed)
 Anesthesia Post Note  Patient: Jacqueline [name]  Procedure(s) Performed: LUMPECTOMY WITH MAGNETIC MARKER LOCALIZATION (Right: Breast)     Patient location during evaluation: PACU Anesthesia Type: General Level of consciousness: awake and alert Pain management: pain level controlled Vital Signs Assessment: post-procedure vital signs reviewed and stable Respiratory status: spontaneous breathing, nonlabored ventilation, respiratory function stable and patient connected to nasal cannula oxygen Cardiovascular status: blood pressure returned to baseline and stable Postop Assessment: no apparent nausea or vomiting Anesthetic complications: no   No notable events documented.  Last Vitals:  Vitals:   10/13/24 1355 10/13/24 1406  BP:  (!) 126/56  Pulse: 62 62  Resp: 11 16  Temp:  (!) 36.2 C  SpO2: 95% 93%    Last Pain:  Vitals:   10/13/24 1406  TempSrc: Temporal  PainSc: 0-No pain                 Epifanio Lamar BRAVO

## 2024-10-13 NOTE — Interval H&P Note (Signed)
 History and Physical Interval Note:  10/13/2024 12:18 PM  Jacqueline [name]  has presented today for surgery, with the diagnosis of RIGHT BREAST CANCER.  The various methods of treatment have been discussed with the patient and family. After consideration of risks, benefits and other options for treatment, the patient has consented to  Procedure(s): LUMPECTOMY WITH MAGNETIC MARKER LOCALIZATION (Right) as a surgical intervention.  The patient's history has been reviewed, patient examined, no change in status, stable for surgery.  I have reviewed the patient's chart and labs.  Questions were answered to the patient's satisfaction.     Donnice Bury

## 2024-10-13 NOTE — Transfer of Care (Signed)
 Immediate Anesthesia Transfer of Care Note  Patient: Jacqueline [name]  Procedure(s) Performed: LUMPECTOMY WITH MAGNETIC MARKER LOCALIZATION (Right: Breast)  Patient Location: PACU  Anesthesia Type:General  Level of Consciousness: awake, alert , and oriented  Airway & Oxygen Therapy: Patient Spontanous Breathing and Patient connected to face mask oxygen  Post-op Assessment: Report given to RN and Post -op Vital signs reviewed and stable  Post vital signs: Reviewed and stable  Last Vitals:  Vitals Value Taken Time  BP 85/70 10/13/24 13:39  Temp    Pulse 67 10/13/24 13:40  Resp 14 10/13/24 13:40  SpO2 98 % 10/13/24 13:40  Vitals shown include unfiled device data.  Last Pain:  Vitals:   10/13/24 1147  TempSrc: Temporal  PainSc: 0-No pain      Patients Stated Pain Goal: 3 (10/13/24 1147)  Complications: No notable events documented.

## 2024-10-14 ENCOUNTER — Telehealth: Payer: Self-pay | Admitting: Genetic Counselor

## 2024-10-14 ENCOUNTER — Encounter: Payer: Self-pay | Admitting: Genetic Counselor

## 2024-10-14 DIAGNOSIS — Z1379 Encounter for other screening for genetic and chromosomal anomalies: Secondary | ICD-10-CM | POA: Insufficient documentation

## 2024-10-14 NOTE — Telephone Encounter (Signed)
 I contacted  Jacqueline [name] to discuss her genetic testing results. No pathogenic variants were identified in the 77 genes analyzed. Discussed that we do not know why she has Breast cancer or why there is cancer in the family. It could be due to a different gene that we are not testing, or maybe our current technology may not be able to pick something up.  It will be important for her to keep in contact with genetics to keep up with whether additional testing may be needed.Detailed clinic note to follow.   The test report will be scanned into EPIC and will be located under the Molecular Pathology section of the Results Review tab.  A portion of the result report is included below for reference.

## 2024-10-15 ENCOUNTER — Other Ambulatory Visit: Payer: Self-pay

## 2024-10-16 LAB — SURGICAL PATHOLOGY

## 2024-10-19 NOTE — Telephone Encounter (Signed)
 Patient had appointment on 10/07/24 and was provided clearance and clearance was sent to the surgery office.  Surgical clearance will be removed from pool.

## 2024-10-20 ENCOUNTER — Encounter: Payer: Self-pay | Admitting: *Deleted

## 2024-10-20 ENCOUNTER — Other Ambulatory Visit (HOSPITAL_COMMUNITY): Payer: Self-pay

## 2024-10-22 ENCOUNTER — Telehealth: Payer: Self-pay | Admitting: *Deleted

## 2024-10-22 NOTE — Telephone Encounter (Signed)
 Spoke with patient regarding questions and appt with Dr. Gudena.  She wanted to know at what point she would follow up with Dr. Odean.  Explained typically the last week of xrt and that the appts are usually coordinated with rad onc.  Informed her when we know when the last xrt tx will be she will be called with an appt back to see Dr. Odean. Patient verbalized understanding. No further questions at this time,but encouraged her to call should anything arise.

## 2024-11-05 DIAGNOSIS — Z79899 Other long term (current) drug therapy: Secondary | ICD-10-CM | POA: Diagnosis not present

## 2024-11-05 DIAGNOSIS — N183 Chronic kidney disease, stage 3 unspecified: Secondary | ICD-10-CM | POA: Diagnosis not present

## 2024-11-05 DIAGNOSIS — E7800 Pure hypercholesterolemia, unspecified: Secondary | ICD-10-CM | POA: Diagnosis not present

## 2024-11-05 DIAGNOSIS — M791 Myalgia, unspecified site: Secondary | ICD-10-CM | POA: Diagnosis not present

## 2024-11-05 DIAGNOSIS — I4891 Unspecified atrial fibrillation: Secondary | ICD-10-CM | POA: Diagnosis not present

## 2024-11-05 DIAGNOSIS — I10 Essential (primary) hypertension: Secondary | ICD-10-CM | POA: Diagnosis not present

## 2024-11-05 DIAGNOSIS — T466X5A Adverse effect of antihyperlipidemic and antiarteriosclerotic drugs, initial encounter: Secondary | ICD-10-CM | POA: Diagnosis not present

## 2024-11-05 DIAGNOSIS — E1121 Type 2 diabetes mellitus with diabetic nephropathy: Secondary | ICD-10-CM | POA: Diagnosis not present

## 2024-11-05 DIAGNOSIS — D0511 Intraductal carcinoma in situ of right breast: Secondary | ICD-10-CM | POA: Diagnosis not present

## 2024-11-05 DIAGNOSIS — Z23 Encounter for immunization: Secondary | ICD-10-CM | POA: Diagnosis not present

## 2024-11-06 NOTE — Progress Notes (Signed)
 Location of Breast Cancer:right   Histology per Pathology Report:    Receptor Status: ER(100), PR (60), Her2-neu (), Ki-67()  Did patient present with symptoms  (if so, please note symptoms) or was this found on screening mammography?:    Past/Anticipated interventions by surgeon, if any:    Past/Anticipated interventions by medical oncology, if any    Lymphedema issues, if any:      Skin: Healing well.  Pain issues, if any:  no  SAFETY ISSUES: Prior radiation? no Pacemaker/ICD? no Possible current pregnancy?no Is the patient on methotrexate? no  Current Complaints / other details:    BP (!) 144/78 (BP Location: Left Arm, Patient Position: Sitting)   Pulse (!) 53   Temp (!) 97.3 F (36.3 C) (Temporal)   Resp 18   Ht 5' 6 (1.676 m)   Wt 202 lb 6 oz (91.8 kg)   SpO2 99%   BMI 32.66 kg/m

## 2024-11-07 ENCOUNTER — Ambulatory Visit: Payer: Self-pay

## 2024-11-07 DIAGNOSIS — Z1379 Encounter for other screening for genetic and chromosomal anomalies: Secondary | ICD-10-CM

## 2024-11-07 DIAGNOSIS — Z800 Family history of malignant neoplasm of digestive organs: Secondary | ICD-10-CM

## 2024-11-07 DIAGNOSIS — Z808 Family history of malignant neoplasm of other organs or systems: Secondary | ICD-10-CM

## 2024-11-07 DIAGNOSIS — C642 Malignant neoplasm of left kidney, except renal pelvis: Secondary | ICD-10-CM

## 2024-11-07 DIAGNOSIS — Z803 Family history of malignant neoplasm of breast: Secondary | ICD-10-CM

## 2024-11-07 DIAGNOSIS — D0511 Intraductal carcinoma in situ of right breast: Secondary | ICD-10-CM

## 2024-11-07 NOTE — Progress Notes (Signed)
 Negative Genetic Test Results  HPI:  Ms. Castell was previously seen in the [facility] due to a personal and family history of breast cancer and concerns regarding a hereditary predisposition to cancer. Please refer to our prior cancer genetics clinic note for more information regarding our discussion, assessment and recommendations, at the time. Ms. Rallis recent genetic test results were disclosed to her, as were recommendations warranted by these results. These results and recommendations are discussed in more detail below.  CANCER HISTORY:  Oncology History  Renal mass  03/07/2018 Initial Diagnosis   Renal mass   Ductal carcinoma in situ (DCIS) of right breast  09/11/2024 Initial Diagnosis   Screening mammogram detected right breast calcifications and focal asymmetry which measured 0.6 Aranesp ultrasound 4 o'clock position, axilla negative, stereotactic biopsy: Intermediate to high-grade DCIS with small focus of microinvasion ER 100%, PR 60%   09/30/2024 Cancer Staging   Staging form: Breast, AJCC 8th Edition - Clinical stage from 09/30/2024: Stage 0 (cTis (DCIS), cN0, cM0, ER+, PR+, HER2: Not Assessed) - Signed by Odean Potts, MD on 09/30/2024 Stage prefix: Initial diagnosis Nuclear grade: GX Laterality: Right Staged by: Pathologist and managing physician Stage used in treatment planning: Yes National guidelines used in treatment planning: Yes Type of national guideline used in treatment planning: NCCN   10/11/2024 Genetic Testing   Negative genetic testing. Report date is 10/11/2024.  The CancerNext-Expanded gene panel offered by [facility] and includes sequencing, rearrangement, and RNA analysis for the following 77 genes: AIP, ALK, APC, ATM, BAP1, BARD1, BMPR1A, BRCA1, BRCA2, BRIP1, CDC73, CDH1, CDK4, CDKN1B, CDKN2A, CEBPA, CHEK2, CTNNA1, DDX41, DICER1, ETV6, FH, FLCN, GATA2, LZTR1, MAX, MBD4, MEN1, MET, MLH1, MSH2, MSH3, MSH6, MUTYH, NF1, NF2,  NTHL1, PALB2, PHOX2B, PMS2, POT1, PRKAR1A, PTCH1, PTEN, RAD51C, RAD51D, RB1, RET, RPS20, RUNX1, SDHA, SDHAF2, SDHB, SDHC, SDHD, SMAD4, SMARCA4, SMARCB1, SMARCE1, STK11, SUFU, TMEM127, TP53, TSC1, TSC2, VHL, and WT1 (sequencing and deletion/duplication); AXIN2, CTNNA1, DDX41, EGFR, HOXB13, KIT, MBD4, MITF, MSH3, PDGFRA, POLD1 and POLE (sequencing only); EPCAM and GREM1 (deletion/duplication only). RNA data is routinely analyzed for use in variant interpretation for all genes.      FAMILY HISTORY:  We obtained a detailed, 4-generation family history.  Significant diagnoses are listed below: Family History  Problem Relation Age of Onset   Breast cancer Mother 65   Colon cancer Mother 8   Cancer Mother    Heart block Father        probable   Bradycardia Father    Diabetes Maternal Grandmother    Diabetes Paternal Grandmother    Breast cancer Maternal Aunt    Heart disease Paternal Uncle    Colon cancer Maternal Aunt        Dx 50+   Colon cancer Maternal Uncle    Pedigree Summary:  Ms. Weiler has a family history of Mother - Breast Cancer at 3 and Colon Cancer at 67 Maternal Aunt - Breast Cancer at unknown age Maternal 1st Cousin Breast Cancer  Maternal Aunt 2 - Colon Cancer 50+ Maternal Uncle - Colon Cancer at 50+ Paternal 1st Cousin - Thyroid  Cancer at 11 Ms. Bellino is unaware of relatives completing genetic testing for hereditary cancer risks.  There no reported Ashkenazi Jewish ancestry.    GENETIC TEST RESULTS: Genetic testing reported out on 10/11/2024 through the CancerNext-Expanded +RNAinsight panel found no pathogenic mutations.  [facility] CancerNext-Expanded + RNAinsight gene panel which includes sequencing, rearrangement, and RNA analysis for the following 77 genes: AIP, ALK, APC,  ATM, AXIN2, BAP1, BARD1, BMPR1A, BRCA1, BRCA2, BRIP1, CDC73, CDH1, CDK4, CDKN1B, CDKN2A, CEBPA, CHEK2, CTNNA1, DDX41, DICER1, ETV6, FH, FLCN, GATA2, LZTR1, MAX, MBD4, MEN1, MET, MLH1, MSH2,  MSH3, MSH6, MUTYH, NF1, NF2, NTHL1, PALB2, PHOX2B, PMS2, POT1, PRKAR1A, PTCH1, PTEN, RAD51C, RAD51D, RB1, RET, RPS20, RUNX1, SDHA, SDHAF2, SDHB, SDHC, SDHD, SMAD4, SMARCA4, SMARCB1, SMARCE1, STK11, SUFU, TMEM127, TP53, TSC1, TSC2, VHL, and WT1 (sequencing and deletion/duplication); EGFR, HOXB13, KIT, MITF, PDGFRA, POLD1, and POLE (sequencing only); EPCAM and GREM1 (deletion/duplication only).   The test report has been scanned into EPIC and is located under the Molecular Pathology section of the Results Review tab.  A portion of the result report is included below for reference.     We discussed with Ms. Niemeier that because current genetic testing is not perfect, it is possible there may be a gene mutation in one of these genes that current testing cannot detect, but that chance is small.  We also discussed, that there could be another gene that has not yet been discovered, or that we have not yet tested, that is responsible for the cancer diagnoses in the family. It is also possible there is a hereditary cause for the cancer in the family that Ms. Eisinger did not inherit and therefore was not identified in her testing.  Therefore, it is important to remain in touch with cancer genetics in the future so that we can continue to offer Ms. Vaness the most up to date genetic testing.   ADDITIONAL GENETIC TESTING: We discussed with Ms. Register that her genetic testing was fairly extensive.  If there are genes identified to increase cancer risk that can be analyzed in the future, we would be happy to discuss and coordinate this testing at that time.    CANCER SCREENING RECOMMENDATIONS: Ms. Mauri test result is considered negative (normal).  This means that we have not identified a hereditary cause for her personal and family history of cancer at this time. Most cancers happen by chance and this negative test suggests that her personal and family history of cancer may fall into this category.     Possible reasons for Ms. Goldsmith negative genetic test include:  1. There may be a gene mutation in one of these genes that current testing methods cannot detect but that chance is small.  2. There could be another gene that has not yet been discovered, or that we have not yet tested, that is responsible for the cancer diagnoses in the family.  3.  There may be no hereditary risk for cancer in the family. The cancers in Ms. Barron and/or her family may be sporadic/familial or due to other genetic and environmental factors. 4. It is also possible there is a hereditary cause for the cancer in the family that Ms. Behrle did not inherit.  Therefore, it is recommended she continue to follow the cancer management and screening guidelines provided by her oncology and primary healthcare providers. An individual's cancer risk and medical management are not determined by genetic test results alone. Overall cancer risk assessment incorporates additional factors, including personal medical history, family history, and any available genetic information that may result in a personalized plan for cancer prevention and surveillance  Given Ms. Nery personal and family histories, we must interpret these negative results with some caution.  Families with features suggestive of hereditary risk for cancer tend to have multiple family members with cancer, diagnoses in multiple generations and diagnoses before the age of 30. Ms. Segovia family exhibits  some of these features. Thus, this result may simply reflect our current inability to detect all mutations within these genes or there may be a different gene that has not yet been discovered or tested.   An individual's cancer risk and medical management are not determined by genetic test results alone. Overall cancer risk assessment incorporates additional factors, including personal medical history, family history, and any available genetic information that  may result in a personalized plan for cancer prevention and surveillance.  RECOMMENDATIONS FOR FAMILY MEMBERS:  Individuals in this family might be at some increased risk of developing cancer, over the general population risk, simply due to the family history of cancer.  We recommended women in this family have a yearly mammogram beginning at age 40, or 71 years younger than the earliest onset of cancer, an annual clinical breast exam, and perform monthly breast self-exams. Women in this family should also have a gynecological exam as recommended by their primary provider. All family members should be referred for colonoscopy starting at age 45, or 54 years younger than the earliest onset of cancer.  It is also possible there is a hereditary cause for the cancer in Ms. Barse family that she did not inherit and therefore was not identified in her.    FOLLOW-UP: Lastly, we discussed with Ms. Termine that cancer genetics is a rapidly advancing field and it is possible that new genetic tests will be appropriate for her and/or her family members in the future. We encouraged her to remain in contact with cancer genetics on an annual basis so we can update her personal and family histories and let her know of advances in cancer genetics that may benefit this family.   Our contact number was provided. Ms. Bitton questions were answered to her satisfaction, and she knows she is welcome to call us  at anytime with additional questions or concerns.   Santana Fryer, MS, CGC  Certified Genetic Counselor  Email: [email or web address]  Phone: [phone]

## 2024-11-08 NOTE — Progress Notes (Signed)
 Radiation Oncology         [phone] ________________________________  Name: Jacqueline [name] MRN: [id number]  Date: 11/09/2024  DOB: 1951/02/13  Re-Evaluation Note  CC: [name], MD  Odean Potts, MD  No diagnosis found.  Diagnosis:  Stage *** (***) Right Breast LIQ, Invasive ductal carcinoma w/ intermediate-high grade DCIS, ER+ / PR- / Her2+, Grade 2: s/p right breast lumpectomy (note: initial biopsy specimen showed DCIS alone)   Narrative:  The patient returns today to discuss radiation treatment options. She was seen in the multidisciplinary breast clinic in consultation on 09/30/24.   She underwent genetic testing on her breast clinic consultation date. Results (reported on 10/11/24) showed no clinically significant variants detected by CancerNext Expanded +RNAinsight analyses.   Since her consultation date, she opted to proceed with a right breast lumpectomy without nodal biopsies on 10/13/24 under the care of Dr. Ebbie. Pathology from the procedure revealed: grade 2 invasive ductal carcinoma measuring approximately 1.1 mm in the greatest extent, with intermediate to high grade DCIS measuring approximately 18 mm in the greatest linear extent; all margins negative for invasive and in situ carcinoma; margin status to invasive disease of 7 mm form the posterior margin; margin status to in situ disease of 4 mm from the anterior and posterior margins. Prognostic indicators significant for: estrogen receptor 100% positive with strong staining intensity; progesterone receptor 0% negative; Proliferation marker Ki67 at 10%; Her2 status positive; Grade 2.   She was seen by Dr. Odean on her breast clinic consultation date. The initial recommendation at that time was to proceed with antiestrogen therapy (tamoxifen) following radiation therapy (based on her initial biopsy specimen showing DCIS). However, given that her final pathology report shows Her2 positive invasive carcinoma, systemic  therapy will now need to be considered (likely in a neoadjuvant setting if pursued).   On review of systems, the patient reports ***. She denies *** and any other symptoms.    Allergies:  is allergic to lisinopril .  Meds: Current Outpatient Medications  Medication Sig Dispense Refill   acetaminophen  (TYLENOL ) 500 MG tablet Take 500 mg by mouth daily as needed for mild pain or moderate pain.      cetirizine  (ZYRTEC ) 10 MG tablet Take 10 mg by mouth daily.       denosumab  (PROLIA ) 60 MG/ML SOSY injection Inject 60 mg into the skin every 6 (six) months.     Evolocumab  (REPATHA  SURECLICK) 140 MG/ML SOAJ Inject 140 mg into the skin every 14 (fourteen) days. 6 mL 3   famotidine  (PEPCID ) 20 MG tablet Take 1 tablet (20 mg total) by mouth 2 (two) times daily. 180 tablet 3   fluticasone (FLONASE) 50 MCG/ACT nasal spray Place 1 spray into both nostrils daily as needed for allergies or rhinitis.     hydrochlorothiazide  (HYDRODIURIL ) 12.5 MG tablet Take 1 tablet (12.5 mg total) by mouth in the morning. 90 tablet 3   losartan  (COZAAR ) 25 MG tablet Take 3 tablets (75 mg total) by mouth daily. 270 tablet 3   pantoprazole  (PROTONIX ) 40 MG tablet Take 1 tablet (40 mg total) by mouth daily. 90 tablet 3   pramipexole  (MIRAPEX ) 0.25 MG tablet Take 1-2 tablets (0.25-0.5 mg total) by mouth once a day 3 hours before bedtime. 180 tablet 3   Probiotic Product (PROBIOTIC PO) Take 1 capsule by mouth daily.     Rivaroxaban  (XARELTO ) 15 MG TABS tablet Take 1 tablet (15 mg total) by mouth daily. 90 tablet 3   TRULICITY 0.75  MG/0.5ML SOPN SMARTSIG:0.75 Milligram(s) SUB-Q Every 4 Weeks     No current facility-administered medications for this encounter.    Physical Findings: The patient is in no acute distress. Patient is alert and oriented.  vitals were not taken for this visit.  No significant changes. Lungs are clear to auscultation bilaterally. Heart has regular rate and rhythm. No palpable cervical,  supraclavicular, or axillary adenopathy. Abdomen soft, non-tender, normal bowel sounds. Left Breast: no palpable mass, nipple discharge or bleeding. Right Breast: ***  Lab Findings: Lab Results  Component Value Date   WBC 8.8 09/30/2024   HGB 12.9 09/30/2024   HCT 38.2 09/30/2024   MCV 88.8 09/30/2024   PLT 233 09/30/2024    Radiographic Findings: No results found.  Impression:  Stage *** (***) Right Breast LIQ, Invasive ductal carcinoma w/ intermediate-high grade DCIS, ER+ / PR- / Her2+, Grade 2: s/p right breast lumpectomy (note: initial biopsy specimen showed DCIS alone)   ***  Plan:  Patient is scheduled for CT simulation {date/later today}. ***  -----------------------------------  Lynwood CHARM Nasuti, PhD, MD  This document serves as a record of services personally performed by Lynwood Nasuti, MD. It was created on his behalf by Dorthy Fuse, a trained medical scribe. The creation of this record is based on the scribe's personal observations and the provider's statements to them. This document has been checked and approved by the attending provider.

## 2024-11-09 ENCOUNTER — Ambulatory Visit
Admission: RE | Admit: 2024-11-09 | Discharge: 2024-11-09 | Disposition: A | Discharge status: Home / Self Care | Source: Ambulatory Visit | Attending: Radiation Oncology | Admitting: Radiation Oncology

## 2024-11-09 ENCOUNTER — Encounter: Payer: Self-pay | Admitting: Radiation Oncology

## 2024-11-09 VITALS — BP 144/78 | HR 53 | Temp 97.3°F | Resp 18 | Ht 66.0 in | Wt 202.4 lb

## 2024-11-09 DIAGNOSIS — Z79899 Other long term (current) drug therapy: Secondary | ICD-10-CM | POA: Diagnosis not present

## 2024-11-09 DIAGNOSIS — D0511 Intraductal carcinoma in situ of right breast: Secondary | ICD-10-CM

## 2024-11-09 DIAGNOSIS — Z7901 Long term (current) use of anticoagulants: Secondary | ICD-10-CM | POA: Insufficient documentation

## 2024-11-09 DIAGNOSIS — Z170 Estrogen receptor positive status [ER+]: Secondary | ICD-10-CM | POA: Diagnosis not present

## 2024-11-09 DIAGNOSIS — C50311 Malignant neoplasm of lower-inner quadrant of right female breast: Secondary | ICD-10-CM | POA: Insufficient documentation

## 2024-11-09 DIAGNOSIS — Z1731 Human epidermal growth factor receptor 2 positive status: Secondary | ICD-10-CM | POA: Insufficient documentation

## 2024-11-09 DIAGNOSIS — Z1722 Progesterone receptor negative status: Secondary | ICD-10-CM | POA: Insufficient documentation

## 2024-11-10 NOTE — Progress Notes (Signed)
   PROVIDER:  DONNICE CARLIN BURY, MD  MRN: [id number] DOB: 18-Jun-1951 DATE OF ENCOUNTER: 11/10/2024 Interval History:     61 yof with dcis preop underwent magseed guided lumpectomy. Doing well. Path is a 1.1 cm grade II IDC with DCIS.  Negative margins. Did not do node as was DCIS initially.  This is 100% er pos, pr neg, her 2 pos, and Ki is 10%.  Axillary US  was negative preop.  She has no issues postop    Physical Examination:   Physical Exam   Right breast periareolar incision healing well without infection   Assessment and Plan:     Doing well, can return to full activity, wear regular bra We discussed her 2 positive idc.  She knows oncology not recommended chemo/anti her 2 therapy. Is going to proceed with radiotherapy and then antiestrogen We discussed sn biopsy as well.  She had negative axillary us .  We discussed sn biopsy and how this might change therapy. With 1.1 mm  tumor this is unlikely to have nodal mets. We discussed pros and cons of sn biopsy as well.  INSEMA had less than 5% of her 2 positive tumors.  She is 73 as well.  We have elected not to proceed with sn biopsy at this time and do radiotherapy Will see her in one year  MATTHEW CARLIN BURY, MD

## 2024-11-11 ENCOUNTER — Ambulatory Visit
Admission: RE | Admit: 2024-11-11 | Discharge: 2024-11-11 | Disposition: A | Discharge status: Home / Self Care | Source: Ambulatory Visit | Attending: Radiation Oncology | Admitting: Radiation Oncology

## 2024-11-11 DIAGNOSIS — D0511 Intraductal carcinoma in situ of right breast: Secondary | ICD-10-CM | POA: Insufficient documentation

## 2024-11-17 ENCOUNTER — Ambulatory Visit
Admission: RE | Admit: 2024-11-17 | Discharge: 2024-11-17 | Disposition: A | Discharge status: Home / Self Care | Source: Ambulatory Visit | Admitting: Radiation Oncology

## 2024-11-17 ENCOUNTER — Encounter: Payer: Self-pay | Admitting: *Deleted

## 2024-11-17 DIAGNOSIS — D0511 Intraductal carcinoma in situ of right breast: Secondary | ICD-10-CM

## 2024-11-18 ENCOUNTER — Telehealth: Payer: Self-pay | Admitting: Hematology and Oncology

## 2024-11-18 NOTE — Telephone Encounter (Signed)
 left vm for pt about schedulefd post xrt appt date and time. Encouraged to call back with question or concerns

## 2024-11-25 ENCOUNTER — Other Ambulatory Visit: Payer: Self-pay

## 2024-11-25 DIAGNOSIS — D0511 Intraductal carcinoma in situ of right breast: Secondary | ICD-10-CM | POA: Diagnosis not present

## 2024-11-25 LAB — RAD ONC ARIA SESSION SUMMARY
Course Elapsed Days: 0
Plan Fractions Treated to Date: 1
Plan Prescribed Dose Per Fraction: 2.67 Gy
Plan Total Fractions Prescribed: 15
Plan Total Prescribed Dose: 40.05 Gy
Reference Point Dosage Given to Date: 2.67 Gy
Reference Point Session Dosage Given: 2.67 Gy
Session Number: 1

## 2024-11-26 ENCOUNTER — Other Ambulatory Visit: Payer: Self-pay

## 2024-11-26 ENCOUNTER — Ambulatory Visit
Admission: RE | Admit: 2024-11-26 | Discharge: 2024-11-26 | Disposition: A | Discharge status: Home / Self Care | Source: Ambulatory Visit | Attending: Radiation Oncology | Admitting: Radiation Oncology

## 2024-11-26 DIAGNOSIS — D0511 Intraductal carcinoma in situ of right breast: Secondary | ICD-10-CM | POA: Diagnosis not present

## 2024-11-26 LAB — RAD ONC ARIA SESSION SUMMARY
Course Elapsed Days: 1
Plan Fractions Treated to Date: 2
Plan Prescribed Dose Per Fraction: 2.67 Gy
Plan Total Fractions Prescribed: 15
Plan Total Prescribed Dose: 40.05 Gy
Reference Point Dosage Given to Date: 5.34 Gy
Reference Point Session Dosage Given: 2.67 Gy
Session Number: 2

## 2024-11-27 ENCOUNTER — Other Ambulatory Visit: Payer: Self-pay

## 2024-11-27 ENCOUNTER — Ambulatory Visit
Admission: RE | Admit: 2024-11-27 | Discharge: 2024-11-27 | Disposition: A | Source: Ambulatory Visit | Attending: Radiation Oncology | Admitting: Radiation Oncology

## 2024-11-27 DIAGNOSIS — D0511 Intraductal carcinoma in situ of right breast: Secondary | ICD-10-CM | POA: Diagnosis not present

## 2024-11-27 LAB — RAD ONC ARIA SESSION SUMMARY
Course Elapsed Days: 2
Plan Fractions Treated to Date: 3
Plan Prescribed Dose Per Fraction: 2.67 Gy
Plan Total Fractions Prescribed: 15
Plan Total Prescribed Dose: 40.05 Gy
Reference Point Dosage Given to Date: 8.01 Gy
Reference Point Session Dosage Given: 2.67 Gy
Session Number: 3

## 2024-11-30 ENCOUNTER — Other Ambulatory Visit: Payer: Self-pay

## 2024-11-30 ENCOUNTER — Ambulatory Visit
Admission: RE | Admit: 2024-11-30 | Discharge: 2024-11-30 | Disposition: A | Source: Ambulatory Visit | Attending: Radiation Oncology | Admitting: Radiation Oncology

## 2024-11-30 DIAGNOSIS — D0511 Intraductal carcinoma in situ of right breast: Secondary | ICD-10-CM | POA: Diagnosis not present

## 2024-11-30 LAB — RAD ONC ARIA SESSION SUMMARY
Course Elapsed Days: 5
Plan Fractions Treated to Date: 4
Plan Prescribed Dose Per Fraction: 2.67 Gy
Plan Total Fractions Prescribed: 15
Plan Total Prescribed Dose: 40.05 Gy
Reference Point Dosage Given to Date: 10.68 Gy
Reference Point Session Dosage Given: 2.67 Gy
Session Number: 4

## 2024-12-01 ENCOUNTER — Other Ambulatory Visit: Payer: Self-pay

## 2024-12-01 ENCOUNTER — Ambulatory Visit
Admission: RE | Admit: 2024-12-01 | Discharge: 2024-12-01 | Disposition: A | Source: Ambulatory Visit | Attending: Radiation Oncology | Admitting: Radiation Oncology

## 2024-12-01 ENCOUNTER — Ambulatory Visit: Admission: RE | Admit: 2024-12-01

## 2024-12-01 DIAGNOSIS — D0511 Intraductal carcinoma in situ of right breast: Secondary | ICD-10-CM

## 2024-12-01 LAB — RAD ONC ARIA SESSION SUMMARY
Course Elapsed Days: 6
Plan Fractions Treated to Date: 5
Plan Prescribed Dose Per Fraction: 2.67 Gy
Plan Total Fractions Prescribed: 15
Plan Total Prescribed Dose: 40.05 Gy
Reference Point Dosage Given to Date: 13.35 Gy
Reference Point Session Dosage Given: 2.67 Gy
Session Number: 5

## 2024-12-01 MED ORDER — RADIAPLEXRX EX GEL: Freq: Once | CUTANEOUS | Status: AC

## 2024-12-01 MED ORDER — ALRA NON-METALLIC DEODORANT (RAD-ONC)
1.0000 | Freq: Once | TOPICAL | Status: AC
  Administered 2024-12-01: 10:00:00 1 via TOPICAL

## 2024-12-02 ENCOUNTER — Ambulatory Visit
Admission: RE | Admit: 2024-12-02 | Discharge: 2024-12-02 | Attending: Radiation Oncology | Admitting: Radiation Oncology

## 2024-12-02 ENCOUNTER — Other Ambulatory Visit: Payer: Self-pay

## 2024-12-02 DIAGNOSIS — D0511 Intraductal carcinoma in situ of right breast: Secondary | ICD-10-CM | POA: Diagnosis not present

## 2024-12-02 LAB — RAD ONC ARIA SESSION SUMMARY
Course Elapsed Days: 7
Plan Fractions Treated to Date: 6
Plan Prescribed Dose Per Fraction: 2.67 Gy
Plan Total Fractions Prescribed: 15
Plan Total Prescribed Dose: 40.05 Gy
Reference Point Dosage Given to Date: 16.02 Gy
Reference Point Session Dosage Given: 2.67 Gy
Session Number: 6

## 2024-12-03 ENCOUNTER — Ambulatory Visit
Admission: RE | Admit: 2024-12-03 | Discharge: 2024-12-03 | Attending: Radiation Oncology | Admitting: Radiation Oncology

## 2024-12-03 ENCOUNTER — Other Ambulatory Visit: Payer: Self-pay

## 2024-12-03 DIAGNOSIS — D0511 Intraductal carcinoma in situ of right breast: Secondary | ICD-10-CM | POA: Diagnosis not present

## 2024-12-03 LAB — RAD ONC ARIA SESSION SUMMARY
Course Elapsed Days: 8
Plan Fractions Treated to Date: 7
Plan Prescribed Dose Per Fraction: 2.67 Gy
Plan Total Fractions Prescribed: 15
Plan Total Prescribed Dose: 40.05 Gy
Reference Point Dosage Given to Date: 18.69 Gy
Reference Point Session Dosage Given: 2.67 Gy
Session Number: 7

## 2024-12-04 ENCOUNTER — Other Ambulatory Visit: Payer: Self-pay

## 2024-12-04 ENCOUNTER — Ambulatory Visit

## 2024-12-04 DIAGNOSIS — D0511 Intraductal carcinoma in situ of right breast: Secondary | ICD-10-CM | POA: Diagnosis not present

## 2024-12-04 LAB — RAD ONC ARIA SESSION SUMMARY
Course Elapsed Days: 9
Plan Fractions Treated to Date: 8
Plan Prescribed Dose Per Fraction: 2.67 Gy
Plan Total Fractions Prescribed: 15
Plan Total Prescribed Dose: 40.05 Gy
Reference Point Dosage Given to Date: 21.36 Gy
Reference Point Session Dosage Given: 2.67 Gy
Session Number: 8

## 2024-12-06 ENCOUNTER — Ambulatory Visit
Admission: RE | Admit: 2024-12-06 | Discharge: 2024-12-06 | Attending: Radiation Oncology | Admitting: Radiation Oncology

## 2024-12-06 ENCOUNTER — Other Ambulatory Visit: Payer: Self-pay

## 2024-12-06 DIAGNOSIS — D0511 Intraductal carcinoma in situ of right breast: Secondary | ICD-10-CM | POA: Diagnosis not present

## 2024-12-06 LAB — RAD ONC ARIA SESSION SUMMARY
Course Elapsed Days: 11
Plan Fractions Treated to Date: 9
Plan Prescribed Dose Per Fraction: 2.67 Gy
Plan Total Fractions Prescribed: 15
Plan Total Prescribed Dose: 40.05 Gy
Reference Point Dosage Given to Date: 24.03 Gy
Reference Point Session Dosage Given: 2.67 Gy
Session Number: 9

## 2024-12-07 ENCOUNTER — Ambulatory Visit
Admission: RE | Admit: 2024-12-07 | Discharge: 2024-12-07 | Attending: Radiation Oncology | Admitting: Radiation Oncology

## 2024-12-07 ENCOUNTER — Other Ambulatory Visit: Payer: Self-pay

## 2024-12-07 DIAGNOSIS — D0511 Intraductal carcinoma in situ of right breast: Secondary | ICD-10-CM | POA: Diagnosis not present

## 2024-12-07 LAB — RAD ONC ARIA SESSION SUMMARY
Course Elapsed Days: 12
Plan Fractions Treated to Date: 10
Plan Prescribed Dose Per Fraction: 2.67 Gy
Plan Total Fractions Prescribed: 15
Plan Total Prescribed Dose: 40.05 Gy
Reference Point Dosage Given to Date: 26.7 Gy
Reference Point Session Dosage Given: 2.67 Gy
Session Number: 10

## 2024-12-08 ENCOUNTER — Other Ambulatory Visit: Payer: Self-pay

## 2024-12-08 ENCOUNTER — Ambulatory Visit
Admission: RE | Admit: 2024-12-08 | Discharge: 2024-12-08 | Disposition: A | Discharge status: Home / Self Care | Source: Ambulatory Visit | Attending: Radiation Oncology | Admitting: Radiation Oncology

## 2024-12-08 DIAGNOSIS — D0511 Intraductal carcinoma in situ of right breast: Secondary | ICD-10-CM | POA: Diagnosis not present

## 2024-12-08 LAB — RAD ONC ARIA SESSION SUMMARY
Course Elapsed Days: 13
Plan Fractions Treated to Date: 11
Plan Prescribed Dose Per Fraction: 2.67 Gy
Plan Total Fractions Prescribed: 15
Plan Total Prescribed Dose: 40.05 Gy
Reference Point Dosage Given to Date: 29.37 Gy
Reference Point Session Dosage Given: 2.67 Gy
Session Number: 11

## 2024-12-09 ENCOUNTER — Ambulatory Visit
Admission: RE | Admit: 2024-12-09 | Discharge: 2024-12-09 | Disposition: A | Source: Ambulatory Visit | Attending: Radiation Oncology | Admitting: Radiation Oncology

## 2024-12-09 ENCOUNTER — Other Ambulatory Visit: Payer: Self-pay

## 2024-12-09 DIAGNOSIS — D0511 Intraductal carcinoma in situ of right breast: Secondary | ICD-10-CM | POA: Diagnosis not present

## 2024-12-09 LAB — RAD ONC ARIA SESSION SUMMARY
Course Elapsed Days: 14
Plan Fractions Treated to Date: 12
Plan Prescribed Dose Per Fraction: 2.67 Gy
Plan Total Fractions Prescribed: 15
Plan Total Prescribed Dose: 40.05 Gy
Reference Point Dosage Given to Date: 32.04 Gy
Reference Point Session Dosage Given: 2.67 Gy
Session Number: 12

## 2024-12-14 ENCOUNTER — Other Ambulatory Visit: Payer: Self-pay

## 2024-12-14 ENCOUNTER — Ambulatory Visit
Admission: RE | Admit: 2024-12-14 | Discharge: 2024-12-14 | Disposition: A | Discharge status: Home / Self Care | Source: Ambulatory Visit | Attending: Radiation Oncology | Admitting: Radiation Oncology

## 2024-12-14 DIAGNOSIS — D0511 Intraductal carcinoma in situ of right breast: Secondary | ICD-10-CM | POA: Diagnosis not present

## 2024-12-14 LAB — RAD ONC ARIA SESSION SUMMARY
Course Elapsed Days: 19
Plan Fractions Treated to Date: 13
Plan Prescribed Dose Per Fraction: 2.67 Gy
Plan Total Fractions Prescribed: 15
Plan Total Prescribed Dose: 40.05 Gy
Reference Point Dosage Given to Date: 34.71 Gy
Reference Point Session Dosage Given: 2.67 Gy
Session Number: 13

## 2024-12-15 ENCOUNTER — Other Ambulatory Visit: Payer: Self-pay

## 2024-12-15 ENCOUNTER — Ambulatory Visit: Admitting: Radiation Oncology

## 2024-12-15 ENCOUNTER — Ambulatory Visit
Admission: RE | Admit: 2024-12-15 | Discharge: 2024-12-15 | Disposition: A | Discharge status: Home / Self Care | Source: Ambulatory Visit | Attending: Radiation Oncology | Admitting: Radiation Oncology

## 2024-12-15 ENCOUNTER — Other Ambulatory Visit (HOSPITAL_COMMUNITY): Payer: Self-pay

## 2024-12-15 DIAGNOSIS — D0511 Intraductal carcinoma in situ of right breast: Secondary | ICD-10-CM | POA: Diagnosis not present

## 2024-12-15 LAB — RAD ONC ARIA SESSION SUMMARY
Course Elapsed Days: 20
Plan Fractions Treated to Date: 14
Plan Prescribed Dose Per Fraction: 2.67 Gy
Plan Total Fractions Prescribed: 15
Plan Total Prescribed Dose: 40.05 Gy
Reference Point Dosage Given to Date: 37.38 Gy
Reference Point Session Dosage Given: 2.67 Gy
Session Number: 14

## 2024-12-16 ENCOUNTER — Ambulatory Visit
Admission: RE | Admit: 2024-12-16 | Discharge: 2024-12-16 | Disposition: A | Discharge status: Home / Self Care | Source: Ambulatory Visit | Attending: Radiation Oncology | Admitting: Radiation Oncology

## 2024-12-16 ENCOUNTER — Other Ambulatory Visit: Payer: Self-pay

## 2024-12-16 DIAGNOSIS — D0511 Intraductal carcinoma in situ of right breast: Secondary | ICD-10-CM | POA: Diagnosis not present

## 2024-12-16 LAB — RAD ONC ARIA SESSION SUMMARY
Course Elapsed Days: 21
Plan Fractions Treated to Date: 15
Plan Prescribed Dose Per Fraction: 2.67 Gy
Plan Total Fractions Prescribed: 15
Plan Total Prescribed Dose: 40.05 Gy
Reference Point Dosage Given to Date: 40.05 Gy
Reference Point Session Dosage Given: 2.67 Gy
Session Number: 15

## 2024-12-18 ENCOUNTER — Ambulatory Visit: Admitting: Radiation Oncology

## 2024-12-21 ENCOUNTER — Ambulatory Visit
Admission: RE | Admit: 2024-12-21 | Discharge: 2024-12-21 | Disposition: A | Discharge status: Home / Self Care | Source: Ambulatory Visit | Attending: Radiation Oncology | Admitting: Radiation Oncology

## 2024-12-21 ENCOUNTER — Ambulatory Visit

## 2024-12-21 ENCOUNTER — Other Ambulatory Visit: Payer: Self-pay

## 2024-12-21 DIAGNOSIS — D0511 Intraductal carcinoma in situ of right breast: Secondary | ICD-10-CM | POA: Insufficient documentation

## 2024-12-21 LAB — RAD ONC ARIA SESSION SUMMARY
Course Elapsed Days: 26
Plan Fractions Treated to Date: 1
Plan Prescribed Dose Per Fraction: 2 Gy
Plan Total Fractions Prescribed: 5
Plan Total Prescribed Dose: 10 Gy
Reference Point Dosage Given to Date: 2 Gy
Reference Point Session Dosage Given: 2 Gy
Session Number: 16

## 2024-12-22 ENCOUNTER — Ambulatory Visit

## 2024-12-22 ENCOUNTER — Inpatient Hospital Stay: Attending: Hematology and Oncology | Admitting: Hematology and Oncology

## 2024-12-22 ENCOUNTER — Other Ambulatory Visit: Payer: Self-pay

## 2024-12-22 ENCOUNTER — Ambulatory Visit
Admission: RE | Admit: 2024-12-22 | Discharge: 2024-12-22 | Disposition: A | Source: Ambulatory Visit | Attending: Radiation Oncology | Admitting: Radiation Oncology

## 2024-12-22 ENCOUNTER — Other Ambulatory Visit (HOSPITAL_COMMUNITY): Payer: Self-pay

## 2024-12-22 VITALS — BP 138/64 | HR 56 | Temp 98.0°F | Resp 17 | Wt 199.9 lb

## 2024-12-22 DIAGNOSIS — Z800 Family history of malignant neoplasm of digestive organs: Secondary | ICD-10-CM | POA: Insufficient documentation

## 2024-12-22 DIAGNOSIS — D0511 Intraductal carcinoma in situ of right breast: Secondary | ICD-10-CM | POA: Insufficient documentation

## 2024-12-22 DIAGNOSIS — Z79810 Long term (current) use of selective estrogen receptor modulators (SERMs): Secondary | ICD-10-CM | POA: Insufficient documentation

## 2024-12-22 DIAGNOSIS — Z85528 Personal history of other malignant neoplasm of kidney: Secondary | ICD-10-CM | POA: Insufficient documentation

## 2024-12-22 DIAGNOSIS — Z803 Family history of malignant neoplasm of breast: Secondary | ICD-10-CM | POA: Insufficient documentation

## 2024-12-22 DIAGNOSIS — M810 Age-related osteoporosis without current pathological fracture: Secondary | ICD-10-CM | POA: Insufficient documentation

## 2024-12-22 LAB — RAD ONC ARIA SESSION SUMMARY
Course Elapsed Days: 27
Plan Fractions Treated to Date: 2
Plan Prescribed Dose Per Fraction: 2 Gy
Plan Total Fractions Prescribed: 5
Plan Total Prescribed Dose: 10 Gy
Reference Point Dosage Given to Date: 4 Gy
Reference Point Session Dosage Given: 2 Gy
Session Number: 17

## 2024-12-22 MED ORDER — TAMOXIFEN CITRATE 20 MG PO TABS
20.0000 mg | ORAL_TABLET | Freq: Every day | ORAL | 3 refills | Status: AC
  Filled 2024-12-22: qty 90, 90d supply, fill #0

## 2024-12-22 NOTE — Progress Notes (Signed)
 "  Patient Care Team: Jacqueline Nottingham, MD as PCP - General (Internal Medicine) Inocencio Soyla Lunger, MD as PCP - Electrophysiology (Cardiology) Jacqueline [name], Jacqueline HERO, RN as Oncology Nurse Navigator Jacqueline Nanetta SAILOR, RN as Oncology Nurse Navigator Jacqueline Cough, MD as Consulting Physician (General Surgery) Jacqueline Potts, MD as Consulting Physician (Hematology and Oncology) Jacqueline Agent, MD as Consulting Physician (Radiation Oncology)  DIAGNOSIS:  Encounter Diagnosis  Name Primary?   Ductal carcinoma in situ (DCIS) of right breast Yes    SUMMARY OF ONCOLOGIC HISTORY: Oncology History  Renal mass  03/07/2018 Initial Diagnosis   Renal mass   Ductal carcinoma in situ (DCIS) of right breast  09/11/2024 Initial Diagnosis   Screening mammogram detected right breast calcifications and focal asymmetry which measured 0.6 Aranesp ultrasound 4 o'clock position, axilla negative, stereotactic biopsy: Intermediate to high-grade DCIS with small focus of microinvasion ER 100%, PR 60%   09/30/2024 Cancer Staging   Staging form: Breast, AJCC 8th Edition - Clinical stage from 09/30/2024: Stage 0 (cTis (DCIS), cN0, cM0, ER+, PR+, HER2: Not Assessed) - Signed by Jacqueline Potts, MD on 09/30/2024 Stage prefix: Initial diagnosis Nuclear grade: GX Laterality: Right Staged by: Pathologist and managing physician Stage used in treatment planning: Yes National guidelines used in treatment planning: Yes Type of national guideline used in treatment planning: NCCN   10/11/2024 Genetic Testing   Negative genetic testing. Report date is 10/11/2024.  The CancerNext-Expanded gene panel offered by [facility] and includes sequencing, rearrangement, and RNA analysis for the following 77 genes: AIP, ALK, APC, ATM, BAP1, BARD1, BMPR1A, BRCA1, BRCA2, BRIP1, CDC73, CDH1, CDK4, CDKN1B, CDKN2A, CEBPA, CHEK2, CTNNA1, DDX41, DICER1, ETV6, FH, FLCN, GATA2, LZTR1, MAX, MBD4, MEN1, MET, MLH1, MSH2, MSH3, MSH6, MUTYH, NF1, NF2,  NTHL1, PALB2, PHOX2B, PMS2, POT1, PRKAR1A, PTCH1, PTEN, RAD51C, RAD51D, RB1, RET, RPS20, RUNX1, SDHA, SDHAF2, SDHB, SDHC, SDHD, SMAD4, SMARCA4, SMARCB1, SMARCE1, STK11, SUFU, TMEM127, TP53, TSC1, TSC2, VHL, and WT1 (sequencing and deletion/duplication); AXIN2, CTNNA1, DDX41, EGFR, HOXB13, KIT, MBD4, MITF, MSH3, PDGFRA, POLD1 and POLE (sequencing only); EPCAM and GREM1 (deletion/duplication only). RNA data is routinely analyzed for use in variant interpretation for all genes.    10/13/2024 Surgery   Right lumpectomy: 1.1 mm grade 2 IDC with extensive DCIS, margins negative, ER 100%, PR 0%, Ki67 10%, HER2 +3+    11/26/2024 - 12/25/2024 Radiation Therapy   Adjuvant radiation     CHIEF COMPLIANT: Follow-up after radiation to discuss antiestrogen therapy  HISTORY OF PRESENT ILLNESS:   History of Present Illness Jacqueline [name] is a 74 year old female with ER/PR-positive, HER2-positive right breast DCIS with microinvasion, status post lumpectomy and adjuvant radiation, presenting for oncology follow-up to discuss initiation of adjuvant tamoxifen  therapy.  She recently completed adjuvant radiation to the right breast after lumpectomy for Stage IA (T1aN0) DCIS with a 1 mm invasive focus. She has mild post-radiation soreness expected to resolve in about three weeks and denies leg swelling, chest pain, or shortness of breath. She is taking Xarelto .  Final pathology showed a 1 mm invasive carcinoma in a background of DCIS with ER 100%, PR 60%, and HER2 positivity.  She has osteoporosis and a prior hysterectomy, which affect her tamoxifen  risk profile. She asks about prior hair texture changes and the role of hormones and menopause in breast cancer. She denies leg swelling or other symptoms concerning for thromboembolism.        ALLERGIES:  is allergic to lisinopril .  MEDICATIONS:  Current Outpatient Medications  Medication Sig Dispense Refill  acetaminophen  (TYLENOL ) 500 MG tablet Take 500 mg by  mouth daily as needed for mild pain or moderate pain.      cetirizine  (ZYRTEC ) 10 MG tablet Take 10 mg by mouth daily.       denosumab  (PROLIA ) 60 MG/ML SOSY injection Inject 60 mg into the skin every 6 (six) months.     Evolocumab  (REPATHA  SURECLICK) 140 MG/ML SOAJ Inject 140 mg into the skin every 14 (fourteen) days. 6 mL 3   famotidine  (PEPCID ) 20 MG tablet Take 1 tablet (20 mg total) by mouth 2 (two) times daily. 180 tablet 3   fluticasone (FLONASE) 50 MCG/ACT nasal spray Place 1 spray into both nostrils daily as needed for allergies or rhinitis.     hydrochlorothiazide  (HYDRODIURIL ) 12.5 MG tablet Take 1 tablet (12.5 mg total) by mouth in the morning. 90 tablet 3   losartan  (COZAAR ) 25 MG tablet Take 3 tablets (75 mg total) by mouth daily. 270 tablet 3   pantoprazole  (PROTONIX ) 40 MG tablet Take 1 tablet (40 mg total) by mouth daily. 90 tablet 3   pramipexole  (MIRAPEX ) 0.25 MG tablet Take 1-2 tablets (0.25-0.5 mg total) by mouth once a day 3 hours before bedtime. 180 tablet 3   Probiotic Product (PROBIOTIC PO) Take 1 capsule by mouth daily.     Rivaroxaban  (XARELTO ) 15 MG TABS tablet Take 1 tablet (15 mg total) by mouth daily. 90 tablet 3   [START ON 01/17/2025] tamoxifen  (NOLVADEX ) 20 MG tablet Take 1 tablet (20 mg total) by mouth daily. 90 tablet 3   TRULICITY 0.75 MG/0.5ML SOPN SMARTSIG:0.75 Milligram(s) SUB-Q Every 4 Weeks     No current facility-administered medications for this visit.    PHYSICAL EXAMINATION: ECOG PERFORMANCE STATUS: 1 - Symptomatic but completely ambulatory  Vitals:   12/22/24 0918  BP: 138/64  Pulse: (!) 56  Resp: 17  Temp: 98 F (36.7 C)  SpO2: 97%   Filed Weights   12/22/24 0918  Weight: 199 lb 14.4 oz (90.7 kg)      LABORATORY DATA:  I have reviewed the data as listed    Latest Ref Rng & Units 09/30/2024   12:33 PM 03/09/2018    4:39 AM 03/08/2018    5:03 AM  CMP  Glucose 70 - 99 mg/dL 889  811  822   BUN 8 - 23 mg/dL 22  21  19    Creatinine  0.44 - 1.00 mg/dL 8.46  8.35  8.23   Sodium 135 - 145 mmol/L 139  134  136   Potassium 3.5 - 5.1 mmol/L 3.9  3.6  3.8   Chloride 98 - 111 mmol/L 106  101  100   CO2 22 - 32 mmol/L 27  25  26    Calcium  8.9 - 10.3 mg/dL 9.3  8.0  8.4   Total Protein 6.5 - 8.1 g/dL 6.8     Total Bilirubin 0.0 - 1.2 mg/dL 0.5     Alkaline Phos 38 - 126 U/L 53     AST 15 - 41 U/L 24     ALT 0 - 44 U/L 26       Lab Results  Component Value Date   WBC 8.8 09/30/2024   HGB 12.9 09/30/2024   HCT 38.2 09/30/2024   MCV 88.8 09/30/2024   PLT 233 09/30/2024   NEUTROABS 4.9 09/30/2024    ASSESSMENT & PLAN:  Ductal carcinoma in situ (DCIS) of right breast 09/11/2024:Screening mammogram detected right breast calcifications and focal asymmetry which  measured 0.6 Aranesp ultrasound 4 o'clock position, axilla negative, stereotactic biopsy: Intermediate to high-grade DCIS with small focus of microinvasion ER 100%, PR 60%   10/13/2024: Right lumpectomy: 1.1 mm grade 2 IDC with extensive DCIS, margins negative, ER 100%, PR 0%, Ki67 10%, HER2 +3+  11/26/2024-12/25/2024: Adjuvant radiation  Tamoxifen  counseling: We discussed the risks and benefits of tamoxifen . These include but not limited to insomnia, hot flashes, mood changes, vaginal dryness, and weight gain. Although rare, serious side effects including endometrial cancer, risk of blood clots were also discussed. We strongly believe that the benefits far outweigh the risks. Patient understands these risks and consented to starting treatment. Planned treatment duration is 5 years.  Tamoxifen  chosen because of her history of osteoporosis  Return to clinic in 3 months for survivorship care plan visit       No orders of the defined types were placed in this encounter.  The patient has a good understanding of the overall plan. she agrees with it. she will call with any problems that may develop before the next visit here.  I personally spent a total of 30 minutes in  the care of the patient today including preparing to see the patient, getting/reviewing separately obtained history, performing a medically appropriate exam/evaluation, counseling and educating, placing orders, referring and communicating with other health care professionals, documenting clinical information in the EHR, independently interpreting results, communicating results, and coordinating care.   Viinay K Lowell Mcgurk, MD 12/22/2024    "

## 2024-12-22 NOTE — Assessment & Plan Note (Addendum)
 09/11/2024:Screening mammogram detected right breast calcifications and focal asymmetry which measured 0.6 Aranesp ultrasound 4 o'clock position, axilla negative, stereotactic biopsy: Intermediate to high-grade DCIS with small focus of microinvasion ER 100%, PR 60%   10/13/2024: Right lumpectomy: 1.1 mm grade 2 IDC with extensive DCIS, margins negative, ER 100%, PR 0%, Ki67 10%, HER2 +3+  11/26/2024-12/25/2024: Adjuvant radiation  Tamoxifen  counseling: We discussed the risks and benefits of tamoxifen . These include but not limited to insomnia, hot flashes, mood changes, vaginal dryness, and weight gain. Although rare, serious side effects including endometrial cancer, risk of blood clots were also discussed. We strongly believe that the benefits far outweigh the risks. Patient understands these risks and consented to starting treatment. Planned treatment duration is 5 years.  Tamoxifen  chosen because of her history of osteoporosis  Return to clinic in 3 months for survivorship care plan visit

## 2024-12-23 ENCOUNTER — Ambulatory Visit
Admission: RE | Admit: 2024-12-23 | Discharge: 2024-12-23 | Disposition: A | Discharge status: Home / Self Care | Source: Ambulatory Visit | Attending: Radiation Oncology | Admitting: Radiation Oncology

## 2024-12-23 ENCOUNTER — Other Ambulatory Visit: Payer: Self-pay

## 2024-12-23 ENCOUNTER — Ambulatory Visit

## 2024-12-23 LAB — RAD ONC ARIA SESSION SUMMARY
Course Elapsed Days: 28
Plan Fractions Treated to Date: 3
Plan Prescribed Dose Per Fraction: 2 Gy
Plan Total Fractions Prescribed: 5
Plan Total Prescribed Dose: 10 Gy
Reference Point Dosage Given to Date: 6 Gy
Reference Point Session Dosage Given: 2 Gy
Session Number: 18

## 2024-12-24 ENCOUNTER — Ambulatory Visit

## 2024-12-24 ENCOUNTER — Other Ambulatory Visit: Payer: Self-pay

## 2024-12-24 ENCOUNTER — Ambulatory Visit
Admission: RE | Admit: 2024-12-24 | Discharge: 2024-12-24 | Disposition: A | Discharge status: Home / Self Care | Source: Ambulatory Visit | Attending: Radiation Oncology | Admitting: Radiation Oncology

## 2024-12-24 LAB — RAD ONC ARIA SESSION SUMMARY
Course Elapsed Days: 29
Plan Fractions Treated to Date: 4
Plan Prescribed Dose Per Fraction: 2 Gy
Plan Total Fractions Prescribed: 5
Plan Total Prescribed Dose: 10 Gy
Reference Point Dosage Given to Date: 8 Gy
Reference Point Session Dosage Given: 2 Gy
Session Number: 19

## 2024-12-25 ENCOUNTER — Other Ambulatory Visit: Payer: Self-pay

## 2024-12-25 ENCOUNTER — Ambulatory Visit
Admission: RE | Admit: 2024-12-25 | Discharge: 2024-12-25 | Disposition: A | Source: Ambulatory Visit | Attending: Radiation Oncology | Admitting: Radiation Oncology

## 2024-12-25 ENCOUNTER — Ambulatory Visit

## 2024-12-25 LAB — RAD ONC ARIA SESSION SUMMARY
Course Elapsed Days: 30
Plan Fractions Treated to Date: 5
Plan Prescribed Dose Per Fraction: 2 Gy
Plan Total Fractions Prescribed: 5
Plan Total Prescribed Dose: 10 Gy
Reference Point Dosage Given to Date: 10 Gy
Reference Point Session Dosage Given: 2 Gy
Session Number: 20

## 2024-12-27 ENCOUNTER — Other Ambulatory Visit (HOSPITAL_COMMUNITY): Payer: Self-pay

## 2024-12-28 ENCOUNTER — Other Ambulatory Visit (HOSPITAL_COMMUNITY): Payer: Self-pay

## 2024-12-28 ENCOUNTER — Other Ambulatory Visit: Payer: Self-pay

## 2024-12-28 MED ORDER — PRAMIPEXOLE DIHYDROCHLORIDE 0.25 MG PO TABS
0.2500 mg | ORAL_TABLET | Freq: Every day | ORAL | 3 refills | Status: AC
  Filled 2024-12-28: qty 180, 90d supply, fill #0

## 2024-12-28 NOTE — Radiation Completion Notes (Signed)
 Patient Name: Jacqueline [name], Jacqueline [name] MRN: [id number] Date of Birth: 02/22/1951 Referring Physician: RYAN HIVES, M.D. Date of Service: 2024-12-28 Radiation Oncologist: Signe Nasuti, M.D. [facility] [place]                             RADIATION ONCOLOGY END OF TREATMENT NOTE     Diagnosis: C50.311 Malignant neoplasm of lower-inner quadrant of right female breast Staging on 2024-09-30: Ductal carcinoma in situ (DCIS) of right breast T=cTis (DCIS), N=cN0, M=cM0 Intent: Curative     ==========DELIVERED PLANS==========  First Treatment Date: 2024-11-25 Last Treatment Date: 2024-12-25   Plan Name: Breast_R Site: Breast, Right Technique: 3D Mode: Photon Dose Per Fraction: 2.67 Gy Prescribed Dose (Delivered / Prescribed): 40.05 Gy / 40.05 Gy Prescribed Fxs (Delivered / Prescribed): 15 / 15   Plan Name: Breast_R_Bst Site: Breast, Right Technique: 3D Mode: Photon Dose Per Fraction: 2 Gy Prescribed Dose (Delivered / Prescribed): 10 Gy / 10 Gy Prescribed Fxs (Delivered / Prescribed): 5 / 5     ==========ON TREATMENT VISIT DATES========== 2024-12-01, 2024-12-08, 2024-12-15, 2024-12-22     ==========UPCOMING VISITS========== 03/24/2025 [facility] ONCOLOGY SURVIVORSHIP CARE PLAN VISIT Crawford Morna Pickle, NP  01/26/2025 [facility]RAD[facility]ATION ONC FOLLOW UP 30 Wyatt Leeroy HERO, [place]  01/26/2025 [facility] ONC FOLLOW UP 15 Nasuti Agent, MD        ==========APPENDIX - ON TREATMENT VISIT NOTES==========   See weekly On Treatment Notes in Epic for details in the Media tab (listed as Progress notes on the On Treatment Visit Dates listed above).

## 2025-01-21 ENCOUNTER — Telehealth: Payer: Self-pay | Admitting: *Deleted

## 2025-01-21 NOTE — Progress Notes (Incomplete)
 "  Radiation Oncology         ([phone] ________________________________  Name: Jacqueline [name] MRN: [id number]  Date: 01/26/2025  DOB: Sep 25, 1951  Follow-Up Visit Note  CC: Jacqueline Nottingham, MD  Jacqueline Nottingham, MD  No diagnosis found. ***  Diagnosis:   Stage IA (pT1a, N0, M0) Right Breast LIQ, Invasive ductal carcinoma w/ intermediate-high grade DCIS, ER+ / PR- / Her2+, Grade 2  Previous Treatment and Interval Since Last Radiation:  1 month   ==========DELIVERED PLANS==========  First Treatment Date: 2024-11-25 Last Treatment Date: 2024-12-25   Plan Name: Breast_R Site: Breast, Right Technique: 3D Mode: Photon Dose Per Fraction: 2.67 Gy Prescribed Dose (Delivered / Prescribed): 40.05 Gy / 40.05 Gy Prescribed Fxs (Delivered / Prescribed): 15 / 15   Plan Name: Breast_R_Bst Site: Breast, Right Technique: 3D Mode: Photon Dose Per Fraction: 2 Gy Prescribed Dose (Delivered / Prescribed): 10 Gy / 10 Gy Prescribed Fxs (Delivered / Prescribed): 5 / 5  Narrative:  The patient returns today for routine follow-up.  She completed her treatment approximately 1 month ago. Throughout her treatment, she developed fatigue and an erythematous, pruritic rash.   She last saw Dr. Gudena on 12/23/2023. At that time they decided to proceed with Tamoxifen  therapy.   In the interval since she was last seen,                               ALLERGIES:  is allergic to lisinopril .  Meds: Current Outpatient Medications  Medication Sig Dispense Refill   acetaminophen  (TYLENOL ) 500 MG tablet Take 500 mg by mouth daily as needed for mild pain or moderate pain.      cetirizine  (ZYRTEC ) 10 MG tablet Take 10 mg by mouth daily.       denosumab  (PROLIA ) 60 MG/ML SOSY injection Inject 60 mg into the skin every 6 (six) months.     Evolocumab  (REPATHA  SURECLICK) 140 MG/ML SOAJ Inject 140 mg into the skin every 14 (fourteen) days. 6 mL 3   famotidine  (PEPCID ) 20 MG tablet Take 1 tablet (20 mg total) by mouth 2  (two) times daily. 180 tablet 3   fluticasone (FLONASE) 50 MCG/ACT nasal spray Place 1 spray into both nostrils daily as needed for allergies or rhinitis.     hydrochlorothiazide  (HYDRODIURIL ) 12.5 MG tablet Take 1 tablet (12.5 mg total) by mouth in the morning. 90 tablet 3   losartan  (COZAAR ) 25 MG tablet Take 3 tablets (75 mg total) by mouth daily. 270 tablet 3   pantoprazole  (PROTONIX ) 40 MG tablet Take 1 tablet (40 mg total) by mouth daily. 90 tablet 3   pramipexole  (MIRAPEX ) 0.25 MG tablet Take 1-2 tablets (0.25-0.5 mg total) by mouth daily 3 hours before bedtime. 180 tablet 3   Probiotic Product (PROBIOTIC PO) Take 1 capsule by mouth daily.     Rivaroxaban  (XARELTO ) 15 MG TABS tablet Take 1 tablet (15 mg total) by mouth daily. 90 tablet 3   tamoxifen  (NOLVADEX ) 20 MG tablet Take 1 tablet (20 mg total) by mouth daily. 90 tablet 3   TRULICITY 0.75 MG/0.5ML SOPN SMARTSIG:0.75 Milligram(s) SUB-Q Every 4 Weeks     No current facility-administered medications for this visit.    Physical Findings:   vitals were not taken for this visit. .    In general this is a well appearing *** in no acute distress. *** alert and oriented x4 and appropriate throughout the examination. Cardiopulmonary assessment is  negative for acute distress and *** exhibits normal effort.     *** breast: No palpable masses, dimpling, skin changes, or axillary adenopathy.  *** breast: ***  Lab Findings: Lab Results  Component Value Date   WBC 8.8 09/30/2024   HGB 12.9 09/30/2024   HCT 38.2 09/30/2024   MCV 88.8 09/30/2024   PLT 233 09/30/2024    Radiographic Findings: No results found.  Impression/Plan: 74 yo female with stage IA (pT1a, N0, M0) Right Breast LIQ, Invasive ductal carcinoma w/ intermediate-high grade DCIS, ER+ / PR- / Her2+, Grade 2: s/p right breast lumpectomy and adjuvant radiation completed on 12/25/2024  [name] has healed well from the effects of her radiation treatment. Continue skin  care with topical Vitamin E Oil and/or lotion for at least 2 more months for further healing.  She will continue on Tamoxifen  under the care of Dr. Odean. She is scheduled to see Jacqueline [name] for survivorship clinic on 03/24/2025.  I encouraged her to continue with yearly mammography as appropriate. Radiation follow-up PRN. We appreciate the opportunity to take part in this patient's care. She has been advised to call back with any issues or concerns.  I personally spent *** minutes in this encounter including chart review, reviewing radiological studies, meeting face-to-face with the patient, entering orders and completing documentation.  ____________________________________    Jacqueline Due, PA-C    "

## 2025-01-21 NOTE — Telephone Encounter (Signed)
 Called patient to ask about coming on 01/26/25 early afternoon, instead of 11 am that day, patient agreed to come @ 2 pm, appt. has been booked

## 2025-01-25 ENCOUNTER — Ambulatory Visit: Admitting: Radiation Oncology

## 2025-01-26 ENCOUNTER — Ambulatory Visit: Admitting: Radiology

## 2025-01-26 ENCOUNTER — Ambulatory Visit: Admitting: Radiation Oncology

## 2025-03-24 ENCOUNTER — Inpatient Hospital Stay: Attending: Hematology and Oncology | Admitting: Adult Health

## 2025-03-24 ENCOUNTER — Inpatient Hospital Stay
